# Patient Record
Sex: Male | Born: 1935 | Race: White | Hispanic: No | Marital: Married | Smoking: Never smoker
Health system: Southern US, Community
[De-identification: ages and names within clinical notes are randomized; demographics above are authoritative.]

## PROBLEM LIST (undated history)

## (undated) DIAGNOSIS — C801 Malignant (primary) neoplasm, unspecified: Secondary | ICD-10-CM

## (undated) DIAGNOSIS — I639 Cerebral infarction, unspecified: Secondary | ICD-10-CM

## (undated) DIAGNOSIS — E559 Vitamin D deficiency, unspecified: Secondary | ICD-10-CM

## (undated) DIAGNOSIS — K922 Gastrointestinal hemorrhage, unspecified: Secondary | ICD-10-CM

## (undated) DIAGNOSIS — K259 Gastric ulcer, unspecified as acute or chronic, without hemorrhage or perforation: Secondary | ICD-10-CM

## (undated) DIAGNOSIS — F0390 Unspecified dementia without behavioral disturbance: Secondary | ICD-10-CM

## (undated) DIAGNOSIS — I739 Peripheral vascular disease, unspecified: Secondary | ICD-10-CM

## (undated) DIAGNOSIS — M719 Bursopathy, unspecified: Secondary | ICD-10-CM

## (undated) DIAGNOSIS — Z8719 Personal history of other diseases of the digestive system: Secondary | ICD-10-CM

## (undated) DIAGNOSIS — I10 Essential (primary) hypertension: Secondary | ICD-10-CM

## (undated) DIAGNOSIS — E785 Hyperlipidemia, unspecified: Secondary | ICD-10-CM

## (undated) DIAGNOSIS — D649 Anemia, unspecified: Secondary | ICD-10-CM

## (undated) DIAGNOSIS — R251 Tremor, unspecified: Secondary | ICD-10-CM

## (undated) DIAGNOSIS — R0600 Dyspnea, unspecified: Secondary | ICD-10-CM

## (undated) DIAGNOSIS — R29898 Other symptoms and signs involving the musculoskeletal system: Secondary | ICD-10-CM

## (undated) DIAGNOSIS — E538 Deficiency of other specified B group vitamins: Secondary | ICD-10-CM

## (undated) DIAGNOSIS — N189 Chronic kidney disease, unspecified: Secondary | ICD-10-CM

## (undated) DIAGNOSIS — I454 Nonspecific intraventricular block: Secondary | ICD-10-CM

## (undated) DIAGNOSIS — E669 Obesity, unspecified: Secondary | ICD-10-CM

## (undated) HISTORY — PX: EYE SURGERY: SHX253

## (undated) HISTORY — PX: BACK SURGERY: SHX140

## (undated) HISTORY — PX: HERNIA REPAIR: SHX51

---

## 2006-01-11 ENCOUNTER — Emergency Department: Payer: Self-pay | Admitting: Emergency Medicine

## 2006-01-21 ENCOUNTER — Ambulatory Visit: Payer: Self-pay | Admitting: Internal Medicine

## 2006-05-19 ENCOUNTER — Ambulatory Visit: Payer: Self-pay | Admitting: Urology

## 2007-02-01 ENCOUNTER — Ambulatory Visit: Payer: Self-pay | Admitting: Ophthalmology

## 2007-06-27 ENCOUNTER — Ambulatory Visit: Payer: Self-pay | Admitting: Internal Medicine

## 2007-06-28 ENCOUNTER — Inpatient Hospital Stay: Payer: Self-pay | Admitting: Unknown Physician Specialty

## 2007-06-28 ENCOUNTER — Other Ambulatory Visit: Payer: Self-pay

## 2007-07-01 ENCOUNTER — Encounter: Payer: Self-pay | Admitting: Internal Medicine

## 2007-07-04 ENCOUNTER — Encounter: Payer: Self-pay | Admitting: Internal Medicine

## 2009-05-08 ENCOUNTER — Ambulatory Visit: Payer: Self-pay | Admitting: Unknown Physician Specialty

## 2009-12-25 ENCOUNTER — Ambulatory Visit: Payer: Self-pay | Admitting: Ophthalmology

## 2014-06-25 DIAGNOSIS — D649 Anemia, unspecified: Secondary | ICD-10-CM | POA: Insufficient documentation

## 2015-12-12 ENCOUNTER — Emergency Department
Admission: EM | Admit: 2015-12-12 | Discharge: 2015-12-12 | Disposition: A | Discharge status: Other Acute Inpatient Hospital | Payer: Medicare Other | Attending: Emergency Medicine | Admitting: Emergency Medicine

## 2015-12-12 ENCOUNTER — Encounter: Payer: Self-pay | Admitting: Intensive Care

## 2015-12-12 ENCOUNTER — Emergency Department: Payer: Medicare Other

## 2015-12-12 ENCOUNTER — Inpatient Hospital Stay (HOSPITAL_COMMUNITY)
Admission: AD | Admit: 2015-12-12 | Discharge: 2015-12-14 | DRG: 065 | Disposition: A | Discharge status: Home / Self Care | Payer: Medicare Other | Source: Other Acute Inpatient Hospital | Attending: Neurology | Admitting: Neurology

## 2015-12-12 DIAGNOSIS — I161 Hypertensive emergency: Secondary | ICD-10-CM | POA: Diagnosis not present

## 2015-12-12 DIAGNOSIS — I169 Hypertensive crisis, unspecified: Secondary | ICD-10-CM | POA: Diagnosis not present

## 2015-12-12 DIAGNOSIS — R29700 NIHSS score 0: Secondary | ICD-10-CM | POA: Diagnosis not present

## 2015-12-12 DIAGNOSIS — E785 Hyperlipidemia, unspecified: Secondary | ICD-10-CM | POA: Diagnosis present

## 2015-12-12 DIAGNOSIS — I619 Nontraumatic intracerebral hemorrhage, unspecified: Secondary | ICD-10-CM | POA: Diagnosis present

## 2015-12-12 DIAGNOSIS — R200 Anesthesia of skin: Secondary | ICD-10-CM | POA: Diagnosis present

## 2015-12-12 DIAGNOSIS — Z886 Allergy status to analgesic agent status: Secondary | ICD-10-CM | POA: Diagnosis not present

## 2015-12-12 DIAGNOSIS — F17220 Nicotine dependence, chewing tobacco, uncomplicated: Secondary | ICD-10-CM | POA: Diagnosis not present

## 2015-12-12 DIAGNOSIS — I10 Essential (primary) hypertension: Secondary | ICD-10-CM | POA: Diagnosis not present

## 2015-12-12 HISTORY — DX: Hyperlipidemia, unspecified: E78.5

## 2015-12-12 LAB — URINALYSIS COMPLETE WITH MICROSCOPIC (ARMC ONLY)
Bacteria, UA: NONE SEEN
Bilirubin Urine: NEGATIVE
GLUCOSE, UA: NEGATIVE mg/dL
KETONES UR: NEGATIVE mg/dL
Leukocytes, UA: NEGATIVE
Nitrite: NEGATIVE
Protein, ur: NEGATIVE mg/dL
SPECIFIC GRAVITY, URINE: 1.012 (ref 1.005–1.030)
SQUAMOUS EPITHELIAL / LPF: NONE SEEN
WBC, UA: NONE SEEN WBC/hpf (ref 0–5)
pH: 6 (ref 5.0–8.0)

## 2015-12-12 LAB — CBC
HCT: 36.9 % — ABNORMAL LOW (ref 40.0–52.0)
Hemoglobin: 12.6 g/dL — ABNORMAL LOW (ref 13.0–18.0)
MCH: 30.4 pg (ref 26.0–34.0)
MCHC: 34.3 g/dL (ref 32.0–36.0)
MCV: 88.7 fL (ref 80.0–100.0)
PLATELETS: 219 10*3/uL (ref 150–440)
RBC: 4.16 MIL/uL — ABNORMAL LOW (ref 4.40–5.90)
RDW: 13.5 % (ref 11.5–14.5)
WBC: 8.8 10*3/uL (ref 3.8–10.6)

## 2015-12-12 LAB — BASIC METABOLIC PANEL
Anion gap: 7 (ref 5–15)
BUN: 21 mg/dL — ABNORMAL HIGH (ref 6–20)
CALCIUM: 9.2 mg/dL (ref 8.9–10.3)
CO2: 27 mmol/L (ref 22–32)
CREATININE: 1.16 mg/dL (ref 0.61–1.24)
Chloride: 105 mmol/L (ref 101–111)
GFR calc Af Amer: >60 mL/min (ref >60)
GFR calc non Af Amer: 56 mL/min — ABNORMAL LOW (ref >60)
GLUCOSE: 98 mg/dL (ref 65–99)
Potassium: 3.6 mmol/L (ref 3.5–5.1)
Sodium: 139 mmol/L (ref 135–145)

## 2015-12-12 LAB — PROTIME-INR
INR: 1.07
Prothrombin Time: 14.1 seconds (ref 11.4–15.0)

## 2015-12-12 LAB — APTT: APTT: 37 s — AB (ref 24–36)

## 2015-12-12 LAB — TROPONIN I: TROPONIN I: 0.05 ng/mL — AB (ref <0.031)

## 2015-12-12 MED ORDER — PANTOPRAZOLE SODIUM 40 MG IV SOLR
40.0000 mg | Freq: Every day | INTRAVENOUS | Status: DC
  Administered 2015-12-12: 40 mg via INTRAVENOUS
  Filled 2015-12-12: qty 40

## 2015-12-12 MED ORDER — ACETAMINOPHEN 325 MG PO TABS: 650.0000 mg | ORAL_TABLET | ORAL | Status: DC | PRN

## 2015-12-12 MED ORDER — SENNOSIDES-DOCUSATE SODIUM 8.6-50 MG PO TABS
1.0000 | ORAL_TABLET | Freq: Two times a day (BID) | ORAL | Status: DC
Start: 2015-12-12 — End: 2015-12-14
  Administered 2015-12-12 – 2015-12-14 (×3): 1 via ORAL
  Filled 2015-12-12 (×3): qty 1

## 2015-12-12 MED ORDER — ACETAMINOPHEN 650 MG RE SUPP: 650.0000 mg | RECTAL | Status: DC | PRN

## 2015-12-12 MED ORDER — NICARDIPINE HCL IN NACL 20-0.86 MG/200ML-% IV SOLN
3.0000 mg/h | Freq: Once | INTRAVENOUS | Status: AC
Start: 2015-12-12 — End: 2015-12-12
  Administered 2015-12-12: 3 mg/h via INTRAVENOUS
  Filled 2015-12-12: qty 200

## 2015-12-12 MED ORDER — STROKE: EARLY STAGES OF RECOVERY BOOK
Freq: Once | Status: AC
  Administered 2015-12-12: 1
  Filled 2015-12-12: qty 1

## 2015-12-12 MED ORDER — NICARDIPINE HCL IN NACL 20-0.86 MG/200ML-% IV SOLN
3.0000 mg/h | INTRAVENOUS | Status: DC
  Administered 2015-12-12: 2 mg/h via INTRAVENOUS
  Administered 2015-12-13: 3 mg/h via INTRAVENOUS
  Filled 2015-12-12 (×2): qty 200

## 2015-12-12 NOTE — ED Provider Notes (Addendum)
[facility] Emergency Department Provider Note  ____________________________________________   I have reviewed the triage vital signs and the nursing notes.   HISTORY  Chief Complaint Numbness    HPI Leroy [name] is a 82 y.o. male who is not on any blood thinners or aspirin, his remote history of GI bleed and back surgery, takes medicine for high cholesterol only, states his normal blood pressures in the 120s to 130s. Presents today after being out chain sawing. He was chain sawing he did not fall or suffer any injury but his bilateral legs and bilateral hands became weak. He did not fall. He states he had trouble walking however and he sat down. EMS brought him directly here. He does not have a headache. He has no neck pain. He has no change in vision or hearing, he has no difficulty talking at this time he states he feels "okay".  History reviewed. No pertinent past medical history.  There are no active problems to display for this patient.   Past Surgical History  Procedure Laterality Date  . Back surgery    . Hernia repair      No current outpatient prescriptions on file.  Allergies Aspirin  History reviewed. No pertinent family history.  Social History Social History  Substance Use Topics  . Smoking status: Never Smoker   . Smokeless tobacco: Current User    Types: Chew  . Alcohol Use: No    Review of Systems Constitutional: No fever/chills Eyes: No visual changes. ENT: No sore throat. No stiff neck no neck pain Cardiovascular: Denies chest pain. Respiratory: Denies shortness of breath. Gastrointestinal:   no vomiting.  No diarrhea.  No constipation. Genitourinary: Negative for dysuria. Musculoskeletal: Negative lower extremity swelling Skin: Negative for rash. Neurological: Negative for headaches, see H&P 10-point ROS otherwise negative.  ____________________________________________   PHYSICAL EXAM:  VITAL SIGNS: ED Triage  Vitals  Enc Vitals Group     BP 12/12/15 1406 188/101 mmHg     Pulse Rate 12/12/15 1406 69     Resp 12/12/15 1406 20     Temp 12/12/15 1406 97.9 F (36.6 C)     Temp Source 12/12/15 1406 Oral     SpO2 12/12/15 1406 97 %     Weight 12/12/15 1406 244 lb 9.6 oz (110.95 kg)     Height 12/12/15 1406 6\' 1"  (1.854 m)     Head Cir --      Peak Flow --      Pain Score --      Pain Loc --      Pain Edu? --      Excl. in [place]? --     Constitutional: Alert and oriented. Well appearing and in no acute distress. Eyes: Conjunctivae are normal. PERRL. EOMI. Head: Atraumatic. Nose: No congestion/rhinnorhea. Mouth/Throat: Mucous membranes are moist.  Oropharynx non-erythematous. Neck: No stridor.   Nontender with no meningismus Cardiovascular: Normal rate, regular rhythm. Grossly normal heart sounds.  Good peripheral circulation. Respiratory: Normal respiratory effort.  No retractions. Lungs CTAB. Abdominal: Soft and nontender. No distention. No guarding no rebound Back:  There is no focal tenderness or step off there is no midline tenderness there are no lesions noted. there is no CVA tenderness Musculoskeletal: No lower extremity tenderness. No joint effusions, no DVT signs strong distal pulses no edema Neurologic: Cranial nerves II through XII are grossly intact 5 out of 5 strength bilateral upper and lower extremity. Finger to nose within normal limits heel to  shin within normal limits, speech is normal with no word finding difficulty or dysarthria, reflexes symmetric, pupils are equally round and reactive to light, there is no pronator drift, sensation is normal, vision is intact to confrontation, gait is deferred, there is no nystagmus, normal neurologic exam Skin:  Skin is warm, dry and intact. No rash noted. Psychiatric: Mood and affect are normal. Speech and behavior are normal.  ____________________________________________   LABS (all labs ordered are listed, but only abnormal results are  displayed)  Labs Reviewed  CBC - Abnormal; Notable for the following:    RBC 4.16 (*)    Hemoglobin 12.6 (*)    HCT 36.9 (*)    All other components within normal limits  BASIC METABOLIC PANEL  URINALYSIS COMPLETEWITH MICROSCOPIC ([facility] ONLY)  TROPONIN I  PROTIME-INR  APTT  CBG MONITORING, ED   ____________________________________________  EKG  I personally interpreted any EKGs ordered by me or triage  ____________________________________________  RADIOLOGY  I reviewed any imaging ordered by me or triage that were performed during my shift ____________________________________________   PROCEDURES  Procedure(s) performed: None  Critical Care performed: CRITICAL CARE Performed by: Schuyler Amor   Total critical care time: 60 minutes  Critical care time was exclusive of separately billable procedures and treating other patients.  Critical care was necessary to treat or prevent imminent or life-threatening deterioration.  Critical care was time spent personally by me on the following activities: development of treatment plan with patient and/or surrogate as well as nursing, discussions with consultants, evaluation of patient's response to treatment, examination of patient, obtaining history from patient or surrogate, ordering and performing treatments and interventions, ordering and review of laboratory studies, ordering and review of radiographic studies, pulse oximetry and re-evaluation of patient's condition.   ____________________________________________   INITIAL IMPRESSION / ASSESSMENT AND PLAN / ED COURSE  Pertinent labs & imaging results that were available during my care of the patient were reviewed by me and considered in my medical decision making (see chart for details).  Patient with her current NIH stroke scale of 0 however doesn't respond to prior 12-lead which is possibly hypertensive. So the nontraumatic by history. We will start him on a Cardene  drip to gradually bring down his blood pressure. He is in no distress with no headache and awake and alert. He is not on any blood thinners. At patient request we are calling [facility] to see if we can get him transferred there. The patient has no complaints. I have made aware of his findings.  ----------------------------------------- 3:51 PM on 12/12/2015 -----------------------------------------  [facility] is on diversion, calling [facility]  ----------------------------------------- 4:10 PM on 12/12/2015 -----------------------------------------  Blood pressure is trending down I did discuss with neurosurgeon Dr Sherley Bounds at [facility], who agrees with management including Cardene drip, however, he states that she go to the medicine service which is a usual protocol. He is asked me to call medicine service for admission. Patient remains with an NIH stroke scale of 0 we are calling the medicine service.    ----------------------------------------- 4:43 PM on 12/12/2015 -----------------------------------------  Pressure in the 0000000 systolic, patient remains an NIH stroke scale of 0, we will keep the drip at a very low rate, thick is done much father will turn it off. I did discuss with medicine at [facility] and they tell me that this patient needs to be admitted to neurology. Therefore we are not paging neurology. Dr. Hayden Rasmussen states bc of gtt he needs neurology to  admit.    ----------------------------------------- 4:49 PM on 12/12/2015 -----------------------------------------  Stressed with Dr. Jim Like, who agrees with management does wish the patient to be in the drip and [place] for a pressure below 160 for this patient which I think is not unreasonable. They sent the patient in ED to inpatient transfer.  ----------------------------------------- 5:52 PM on 12/12/2015 -----------------------------------------  No complaints no symptoms and NIH stroke scale 0 neuro exam still  unremarkable awaiting transport. [facility] is making a bed for him apparently.  ----------------------------------------- 7:56 PM on 12/12/2015 -----------------------------------------  Patient still waiting patiently for transfer. Blood pressures are mostly in the 150s and 160s which is fine for him I believe. I am skeptical about his idea of his baseline blood pressure, and I do not wish to aggressively drop it too precipitously. In any event he does respond to the drip well and I think at this time since he has no symptoms we'll leave it where it is. Patient has no headache and his NIH stroke scale remained 0 with a normal neurologic exam..     ____________________________________________   FINAL CLINICAL IMPRESSION(S) / ED DIAGNOSES  Final diagnoses:  None      This chart was dictated using voice recognition software.  Despite best efforts to proofread,  errors can occur which can change meaning.     Schuyler Amor, MD 12/12/15 [place], MD 12/12/15 1551  Schuyler Amor, MD 12/12/15 [place], MD 12/12/15 [place], MD 12/12/15 1643  Schuyler Amor, MD 12/12/15 [place], MD 12/12/15 1752  Schuyler Amor, MD 12/12/15 [phone]

## 2015-12-12 NOTE — ED Notes (Signed)
Per Dr Burlene Arnt, leave nicardipine at 2mg /hour.  Vaughan Basta at [facility] notified of this.  Dr Burlene Arnt wants to keep BP between 160s-170s at this time.

## 2015-12-12 NOTE — ED Notes (Signed)
Attempted to call report. Reported they will call me right back

## 2015-12-12 NOTE — Progress Notes (Signed)
Dr. Silverio Decamp ordered to have SBP goal 120-140. . Current BP 174/79. Cardene gtt increased. Will continue to monitor.

## 2015-12-12 NOTE — ED Notes (Signed)
Patient arrived by [facility] from home. Patient reports "I was using a chainsaw to cut wood and my hands and feet started going numb. Since arriving to the ER my hands feel ok but my feet still fell numb" Patient denies any chest pain and SOB

## 2015-12-12 NOTE — H&P (Signed)
HPI:                                                                                                                                         Leroy [name] is an 82 y.o. male patient who presented to the [facility] ER via EMS earlier this afternoon. He was using chain saw outside, when he noticed sudden onset of numbness in his legs, and also difficulty moving his legs and incoordination. Patient is a poor historian, unable to describe his symptoms involving the legs clearly. It is unclear if he had some coordination problem in his legs. He called EMS and was taken to the ER for further evaluation. Denied any weakness in his upper extremities no vision or speech problems no headaches or neck pain. CT of the head done in the [place] ER showed a tiny 6 mm intraparenchymal hemorrhage in the left basal ganglia. His blood pressure was noted to be highly elevated this to her systolic. He was started on nicardipine drip, and transferred to [facility] neuro ICU for further management. He was noted to be symptomatic prior to transfer, with reported NIHSS 0 per ER physician note.    Date last known well:  12/12/2015 Time last known well:   2 pm tPA Given: No: ICH  ICH score 1 (for age > 33)  Stroke Risk Factors - hypertension  Past Medical History: Past Medical History  Diagnosis Date  . Hyperlipidemia     Past Surgical History  Procedure Laterality Date  . Back surgery    . Hernia repair      Family History: No family history on file.  Social History:   reports that he has never smoked. His smokeless tobacco use includes Chew. He reports that he does not drink alcohol or use illicit drugs.  Allergies:  Allergies  Allergen Reactions  . Aspirin      Medications:                                                                                                                         Current facility-administered medications:  .   stroke: mapping our early stages of recovery  book, , Does not apply, Once, Niyati Heinke Fuller Mandril, MD .  acetaminophen (TYLENOL) tablet 650 mg, 650 mg, Oral, Q4H PRN **OR** acetaminophen (TYLENOL) suppository 650 mg, 650 mg, Rectal, Q4H PRN,  Riese Hellard Fuller Mandril, MD .  nicardipine (CARDENE) 20mg  in 0.86% saline 244ml IV infusion (0.1 mg/ml), 3-15 mg/hr, Intravenous, Continuous, Ricahrd Schwager Fuller Mandril, MD .  pantoprazole (PROTONIX) injection 40 mg, 40 mg, Intravenous, QHS, Trinette Vera Fuller Mandril, MD .  senna-docusate (Senokot-S) tablet 1 tablet, 1 tablet, Oral, BID, Niyla Marone Fuller Mandril, MD   ROS:                                                                                                                                       History obtained from the patient  General ROS: negative for - chills, fatigue, fever, night sweats, weight gain or weight loss Psychological ROS: negative for - behavioral disorder, hallucinations, memory difficulties, mood swings or suicidal ideation Ophthalmic ROS: negative for - blurry vision, double vision, eye pain or loss of vision ENT ROS: negative for - epistaxis, nasal discharge, oral lesions, sore throat, tinnitus or vertigo Allergy and Immunology ROS: negative for - hives or itchy/watery eyes Hematological and Lymphatic ROS: negative for - bleeding problems, bruising or swollen lymph nodes Endocrine ROS: negative for - galactorrhea, hair pattern changes, polydipsia/polyuria or temperature intolerance Respiratory ROS: negative for - cough, hemoptysis, shortness of breath or wheezing Cardiovascular ROS: negative for - chest pain, dyspnea on exertion, edema or irregular heartbeat Gastrointestinal ROS: negative for - abdominal pain, diarrhea, hematemesis, nausea/vomiting or stool incontinence Genito-Urinary ROS: negative for - dysuria, hematuria, incontinence or urinary frequency/urgency Musculoskeletal ROS: negative for - joint swelling or muscular weakness Neurological ROS: as  noted in HPI Dermatological ROS: negative for rash and skin lesion changes  Neurologic Examination:                                                                                                      There were no vitals taken for this visit.  Evaluation of higher integrative functions including: Level of alertness: Alert,  Oriented to time, place and person Speech: fluent, no evidence of dysarthria or aphasia noted.  Test the following cranial nerves: 2-12 grossly intact Motor examination: Normal tone, bulk, full 5/5 motor strength in all 4 extremities Examination of sensation : Normal and symmetric sensation to pinprick in all 4 extremities and on face Examination of deep tendon reflexes: 2+,symmetric in all extremities,mute plantars Test coordination: Normal finger nose testing, with no evidence of limb appendicular ataxia or abnormal involuntary movements or tremors noted.  Gait: Deferred   Lab Results: Basic Metabolic Panel:  Recent Labs Lab 12/12/15 1438  NA 139  K 3.6  CL 105  CO2 27  GLUCOSE 98  BUN 21*  CREATININE 1.16  CALCIUM 9.2    Liver Function Tests: No results for input(s): AST, ALT, ALKPHOS, BILITOT, PROT, ALBUMIN in the last 168 hours. No results for input(s): LIPASE, AMYLASE in the last 168 hours. No results for input(s): AMMONIA in the last 168 hours.  CBC:  Recent Labs Lab 12/12/15 1438  WBC 8.8  HGB 12.6*  HCT 36.9*  MCV 88.7  PLT 219    Cardiac Enzymes:  Recent Labs Lab 12/12/15 1438  TROPONINI 0.05*    Lipid Panel: No results for input(s): CHOL, TRIG, HDL, CHOLHDL, VLDL, LDLCALC in the last 168 hours.  CBG: No results for input(s): GLUCAP in the last 168 hours.  Microbiology: No results found for this or any previous visit.   Imaging: Ct Head Wo Contrast  12/12/2015  CLINICAL DATA:  Numbness of the hands and feet starting today. EXAM: CT HEAD WITHOUT CONTRAST TECHNIQUE: Contiguous axial images were obtained from the base  of the skull through the vertex without intravenous contrast. COMPARISON:  None. FINDINGS: There is a 5.7 mm focus of increased density in the left posterior limb internal capsule. There is a 2 mm focus of increased density in the right anterior limb internal capsule. There is focal calcification in the right cerebellum. There is no midline shift, hydrocephalus or mass. No acute transcortical infarct is identified. The bony calvarium is intact. Minimal mucoperiosteal thickening of bilateral ethmoid sinuses are noted. IMPRESSION: 5.7 mm focus of increased density in the left posterior limb internal capsule suspicious for acute hemorrhage. There is a 2 mm focus of increased density in the right anterior limb internal capsule, small focus of acute hemorrhage is not excluded. Critical Value/emergent results were called by telephone at the time of interpretation on 12/12/2015 at 3:26 pm to Dr. Delman Kitten , who verbally acknowledged these results. Electronically Signed   By: Abelardo Diesel M.D.   On: 12/12/2015 15:27    Assessment and plan:   Leroy [name] is an 82 y.o. 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 He was started on nicardipine drip prior to transfer , his blood pressure continued to be elevated when he arrived at [facility] systolic blood pressures in 170s.  Patient remains asymptomatic at this time, with nonfocal neurological examination.NIHSS 0, ICH score 1 (for age > 44).   Plan: 1. Titrate nicardipine drip for a goal systolic blood pressure between 120-140 to prevent expansion of the intraparenchymal hemorrhage.  2. Ordered MRI of the brain with and without contrast, and MRA of the head 3. Ultrasound of the carotids, transthoracic echocardiogram. Monitored with cardiac telemetry in the  ICU. 4. Does not take antiplatelet or anticoagulant agents at home. SCDs for DVT prophylaxis. Glucose monitoring, check A1c, lipid profile.  5. Physical and occupational therapy, swallow evaluation.  Discussed the neurological assessment, reviewed the CT of the brain images with the patient and his family members at bedside, answered several of their questions to their satisfaction.  Stroke team will continue to follow up starting tomorrow morning. Please call for any further questions.

## 2015-12-13 ENCOUNTER — Observation Stay (HOSPITAL_COMMUNITY): Payer: Medicare Other

## 2015-12-13 ENCOUNTER — Inpatient Hospital Stay (HOSPITAL_COMMUNITY): Payer: Medicare Other

## 2015-12-13 DIAGNOSIS — Z886 Allergy status to analgesic agent status: Secondary | ICD-10-CM | POA: Diagnosis not present

## 2015-12-13 DIAGNOSIS — I6789 Other cerebrovascular disease: Secondary | ICD-10-CM | POA: Diagnosis not present

## 2015-12-13 DIAGNOSIS — R29700 NIHSS score 0: Secondary | ICD-10-CM | POA: Diagnosis present

## 2015-12-13 DIAGNOSIS — E785 Hyperlipidemia, unspecified: Secondary | ICD-10-CM | POA: Diagnosis present

## 2015-12-13 DIAGNOSIS — F17220 Nicotine dependence, chewing tobacco, uncomplicated: Secondary | ICD-10-CM | POA: Diagnosis present

## 2015-12-13 DIAGNOSIS — I161 Hypertensive emergency: Secondary | ICD-10-CM | POA: Diagnosis present

## 2015-12-13 DIAGNOSIS — I619 Nontraumatic intracerebral hemorrhage, unspecified: Secondary | ICD-10-CM | POA: Diagnosis present

## 2015-12-13 DIAGNOSIS — I610 Nontraumatic intracerebral hemorrhage in hemisphere, subcortical: Secondary | ICD-10-CM | POA: Diagnosis not present

## 2015-12-13 LAB — GLUCOSE, CAPILLARY
GLUCOSE-CAPILLARY: 100 mg/dL — AB (ref 65–99)
GLUCOSE-CAPILLARY: 137 mg/dL — AB (ref 65–99)
GLUCOSE-CAPILLARY: 115 mg/dL — AB (ref 65–99)
Glucose-Capillary: 131 mg/dL — ABNORMAL HIGH (ref 65–99)

## 2015-12-13 LAB — MRSA PCR SCREENING: MRSA by PCR: NEGATIVE

## 2015-12-13 LAB — LIPID PANEL
CHOL/HDL RATIO: 3.7 ratio
CHOLESTEROL: 177 mg/dL (ref 0–200)
HDL: 48 mg/dL (ref >40)
LDL Cholesterol: 104 mg/dL — ABNORMAL HIGH (ref 0–99)
TRIGLYCERIDES: 124 mg/dL (ref <150)
VLDL: 25 mg/dL (ref 0–40)

## 2015-12-13 MED ORDER — GADOBENATE DIMEGLUMINE 529 MG/ML IV SOLN
20.0000 mL | Freq: Once | INTRAVENOUS | Status: AC | PRN
  Administered 2015-12-13: 20 mL via INTRAVENOUS

## 2015-12-13 MED ORDER — PANTOPRAZOLE SODIUM 40 MG PO TBEC: 40.0000 mg | DELAYED_RELEASE_TABLET | Freq: Every day | ORAL | Status: DC

## 2015-12-13 MED ORDER — AMLODIPINE BESYLATE 5 MG PO TABS
5.0000 mg | ORAL_TABLET | Freq: Every day | ORAL | Status: DC
  Administered 2015-12-13 – 2015-12-14 (×2): 5 mg via ORAL
  Filled 2015-12-13 (×2): qty 1

## 2015-12-13 MED ORDER — ATORVASTATIN CALCIUM 10 MG PO TABS
20.0000 mg | ORAL_TABLET | Freq: Every day | ORAL | Status: DC
  Administered 2015-12-13 – 2015-12-14 (×2): 20 mg via ORAL
  Filled 2015-12-13 (×2): qty 2

## 2015-12-13 NOTE — Progress Notes (Signed)
Patient arrived to 5M14 from 25M. Safety precautions and orders reviewed with patient. Lunch reorder. No other distress noted. Will continue to monitor.   Ave Filter, RN

## 2015-12-13 NOTE — Progress Notes (Signed)
*  PRELIMINARY RESULTS* Vascular Ultrasound Carotid Duplex (Doppler) has been completed.  Preliminary findings: Bilateral: No significant (1-39%) ICA stenosis. Antegrade vertebral flow.    Landry Mellow, RDMS, RVT  12/13/2015, 4:57 PM

## 2015-12-13 NOTE — Evaluation (Signed)
Speech Language Pathology Evaluation Patient Details Name: Leroy [name] MRN: [id number] DOB: 1929/11/29 Today's Date: 12/13/2015 Time: [id number] SLP Time Calculation (min) (ACUTE ONLY): 14 min  Problem List:  Patient Active Problem List   Diagnosis Date Noted  . Hypertensive emergency 12/13/2015  . 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 Acute intracerebral hemorrhage ([place]) 12/12/2015   Past Medical History:  Past Medical History  Diagnosis Date  . Hyperlipidemia    Past Surgical History:  Past Surgical History  Procedure Laterality Date  . Back surgery    . Hernia repair     HPI:  82 y.o. male with history of hypertension and hyperlipidemia presenting with numbness, weakness and incoordination in his legs. CT of the brain showed a tiny left basal ganglia hemorrhage.   Assessment / Plan / Recommendation Clinical Impression  Pt presents with baseline speech, language, cognitive function with adequate attention, mild memory impairments (baseline), fluent and appropriate language.  No SLP f/u is recommended - wife/dtr in agreement.  Will sign off.     SLP Assessment  Patient does not need any further Speech Lanaguage Pathology Services    Follow Up Recommendations  None    Frequency and Duration           SLP Evaluation Prior Functioning  Cognitive/Linguistic Baseline: Within functional limits Type of Home: House  Lives With: Spouse Available Help at Discharge: Family;Available 24 hours/day Vocation: Retired   Associate Professor  Overall Cognitive Status: Within Functional Limits for tasks assessed Orientation Level: Oriented X4    Comprehension  Auditory Comprehension Overall Auditory Comprehension: Appears within functional limits for tasks assessed Visual Recognition/Discrimination Discrimination: Within Function Limits Reading Comprehension Reading Status: Not tested    Expression Expression Primary Mode of Expression: Verbal Verbal  Expression Overall Verbal Expression: Appears within functional limits for tasks assessed Written Expression Dominant Hand: Right   Oral / Motor  Oral Motor/Sensory Function Overall Oral Motor/Sensory Function: Within functional limits Motor Speech Overall Motor Speech: Appears within functional limits for tasks assessed   GO                    Leroy [name] 12/13/2015, 2:52 PM

## 2015-12-13 NOTE — Progress Notes (Addendum)
STROKE TEAM PROGRESS NOTE   HISTORY OF PRESENT ILLNESS Leroy [name] is an 82 y.o. male patient who presented to the [facility] ER via EMS earlier this afternoon. He was using chain saw outside, when he noticed sudden onset of numbness in his legs, and also difficulty moving his legs and incoordination. Patient is a poor historian, unable to describe his symptoms involving the legs clearly. It is unclear if he had some coordination problem in his legs. He called EMS and was taken to the ER for further evaluation. Denied any weakness in his upper extremities no vision or speech problems no headaches or neck pain. CT of the head done in the [facility] showed a tiny 6 mm intraparenchymal hemorrhage in the left basal ganglia. His blood pressure was noted to be highly elevated this to her systolic. He was started on nicardipine drip, and transferred to [facility] neuro ICU for further management. He was noted to be symptomatic prior to transfer, with reported NIHSS 0 per ER physician note. he was last known well to 12/12/2015 at 2 PM. ICH score 1 (for age > 70). Patient was not administered TPA secondary to intracerebral hemorrhage. He was admitted to the neuro ICU for further evaluation and treatment.   SUBJECTIVE (INTERVAL HISTORY) His daughter Leroy [name] and son in law Leroy [name] is at the bedside. Patient is sitting up in the chair. Overall he feels his condition is stable. He recounted history of bilateral loss of coordination.    OBJECTIVE Temp:  [97.5 F (36.4 C)-97.9 F (36.6 C)] 97.6 F (36.4 C) (02/10 0731) Pulse Rate:  [58-89] 60 (02/10 0830) Cardiac Rhythm:  [-]  Resp:  [10-20] 16 (02/10 0830) BP: (111-196)/(63-136) 126/75 mmHg (02/10 0830) SpO2:  [92 %-100 %] 98 % (02/10 0830) Weight:  [107.5 kg (236 lb 15.9 oz)-110.95 kg (244 lb 9.6 oz)] 107.5 kg (236 lb 15.9 oz) (02/10 0000)  CBC:   Recent Labs Lab 12/12/15 1438  WBC 8.8  HGB 12.6*  HCT 36.9*  MCV 88.7  PLT 219     Basic Metabolic Panel:   Recent Labs Lab 12/12/15 1438  NA 139  K 3.6  CL 105  CO2 27  GLUCOSE 98  BUN 21*  CREATININE 1.16  CALCIUM 9.2    Lipid Panel:     Component Value Date/Time   CHOL 177 12/13/2015 0111   TRIG 124 12/13/2015 0111   HDL 48 12/13/2015 0111   CHOLHDL 3.7 12/13/2015 0111   VLDL 25 12/13/2015 0111   LDLCALC 104* 12/13/2015 0111   HgbA1c: No results found for: HGBA1C Urine Drug Screen: No results found for: LABOPIA, COCAINSCRNUR, LABBENZ, AMPHETMU, THCU, LABBARB    IMAGING  Ct Head Wo Contrast  12/12/2015  CLINICAL DATA:  Numbness of the hands and feet starting today. EXAM: CT HEAD WITHOUT CONTRAST TECHNIQUE: Contiguous axial images were obtained from the base of the skull through the vertex without intravenous contrast. COMPARISON:  None. FINDINGS: There is a 5.7 mm focus of increased density in the left posterior limb internal capsule. There is a 2 mm focus of increased density in the right anterior limb internal capsule. There is focal calcification in the right cerebellum. There is no midline shift, hydrocephalus or mass. No acute transcortical infarct is identified. The bony calvarium is intact. Minimal mucoperiosteal thickening of bilateral ethmoid sinuses are noted. IMPRESSION: 5.7 mm focus of increased density in the left posterior limb internal capsule suspicious for acute hemorrhage. There is a 2 mm  focus of increased density in the right anterior limb internal capsule, small focus of acute hemorrhage is not excluded. Critical Value/emergent results were called by telephone at the time of interpretation on 12/12/2015 at 3:26 pm to Dr. Delman Kitten , who verbally acknowledged these results. Electronically Signed   By: Abelardo Diesel M.D.   On: 12/12/2015 15:27       PHYSICAL EXAM Pleasant elderly caucasian male not in distress. . Afebrile. Head is nontraumatic. Neck is supple without bruit.    Cardiac exam no murmur or gallop. Lungs are clear to  auscultation. Distal pulses are well felt. Neurological Exam :  Awake alert oriented 3 with normal speech and language function. No aphasia or apraxia dysarthria. Pupils are equal reactive. Extraocular movements are full range without nystagmus. Fundi were not visualized. Vision acuity seems adequate. Visual fields are full to bedside confrontational testing. Face is symmetric without weakness. Tongue is midline. Cough and gag are normal. Motor system exam reveals no upper or lower extremity drift. Symmetric and equal strength in all 4 extremity. Normal tone and bulk and coordination. Sensation is intact bilaterally. Finger-to-nose and knee to heel coordination accurate. Gait was not tested. ASSESSMENT/PLAN [name] is a 82 y.o. male with history of hypertension and hyperlipidemia presenting with numbness, weakness and incoordination in his legs. CT of the brain showed a tiny left basal ganglia hemorrhage.   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  MRI  pending   MRA  pending   Carotid Doppler  pending   2D Echo  pending   LDL 104  HgbA1c pending  SCDs for VTE prophylaxis Diet Heart Room service appropriate?: Yes; Fluid consistency:: Thin  No antithrombotic prior to admission. Consider resumption of aspirin in the future once hemorrhage resolved.  Ongoing aggressive stroke risk factor management  Therapy recommendations:  pending . Ok to be OOB.  Disposition:  pending   Transfer to the floor  Hypertensive emergency  Blood pressure 188/101 on arrival with neurologic symptoms  Placed on nicardipine drip with systolic blood pressure goal 160-170 by Dr. Burlene Arnt at [facility], lowered to 120-140 on arrrival to [facility]  Relatively quickly lowered and stayed down on cardene. Drip off this am at 830a.   Not on BP meds at home prior to admission  Patient reports his BP typically remains  low  Add norvasc 5 mg daily  Hyperlipidemia  Home meds:  lipitor 40, resumed in hospital  LDL 104, goal < 70  Continue statin at discharge  Other Stroke Risk Factors  Advanced age  Smokeless tobacco  Obesity, Body mass index is 31.27 kg/(m^2).   Other Active Problems  Creatinine 1.16  Troponin 0.05  Hospital day # 1  BIBY,SHARON  [place] for Pager information 12/13/2015 10:15 AM  I have personally examined this patient, reviewed notes, independently viewed imaging studies, participated in medical decision making and plan of care. I have made any additions or clarifications directly to the above note. Agree with note above. Patient presented with sudden onset of gait ataxia and leg incoordination with significantly elevated blood pressure with tiny 5 mm left subcortical hemorrhage likely due to malignant hypertension. He remains at significant risk for neurological worsening, hematoma expansion, recurrent stroke and needs close neurological monitoring and strict blood pressure control. Start the patient on oral blood pressure medications. And repeat imaging study. Long discussion with the patient and family at the  bedside and answered questions. Transfer to the neurological floor later this evening if stable This patient is critically ill and at significant risk of neurological worsening, death and care requires constant monitoring of vital signs, hemodynamics,respiratory and cardiac monitoring, extensive review of multiple databases, frequent neurological assessment, discussion with family, other specialists and medical decision making of high complexity.I have made any additions or clarifications directly to the above note.This critical care time does not reflect procedure time, or teaching time or supervisory time of PA/NP/Med Resident etc but could involve care discussion time.  I spent 30 minutes of neurocritical care time  in the care of  this  patient.  Antony Contras, MD Medical Director [facility] Pager: [phone] 12/13/2015 2:06 PM    To contact Stroke Continuity provider, please refer to [email or web address] After hours, contact General Neurology

## 2015-12-13 NOTE — Progress Notes (Signed)
OT NOTE OT order received and appreciated however this conflicts with current bedrest order set. Please increase activity tolerance as appropriate and remove bedrest from orders. . Please contact OT at [phone] if bed rest order is discontinued. OT will hold evaluation at this time and will check back as time allows pending increased activity orders.   Jeri Modena   OTR/L Pager: [phone] Office: [phone] .

## 2015-12-13 NOTE — Progress Notes (Signed)
*  PRELIMINARY RESULTS* Echocardiogram 2D Echocardiogram has been performed.  Leroy [name] 12/13/2015, 3:40 PM

## 2015-12-13 NOTE — Evaluation (Signed)
Physical Therapy Evaluation Patient Details Name: Leroy [name] MRN: [id number] DOB: [birth year] Today's Date: 12/13/2015   History of Present Illness  pt presents with L Basal Ganglia ICH.  pt with hx of HTN.    Clinical Impression  Pt very motivated and receptive to cueing.  Pt mildly unsteady during mobility and admits he feels safer with RW at this time.  Discussed continued therapies at home and the need for family to be present initially for mobility.  Feel pt will make a great recovery and will follow acutely.      Follow Up Recommendations Home health PT;Supervision for mobility/OOB    Equipment Recommendations  Rolling walker with 5" wheels    Recommendations for Other Services       Precautions / Restrictions Precautions Precautions: Fall Restrictions Weight Bearing Restrictions: No      Mobility  Bed Mobility Overal bed mobility: Needs Assistance Bed Mobility: Supine to Sit     Supine to sit: Supervision     General bed mobility comments: pt needs increased time and heavy use of UEs, but no physical A needed.    Transfers Overall transfer level: Needs assistance Equipment used: None;Rolling walker (2 wheeled) Transfers: Sit to/from Stand Sit to Stand: Min guard         General transfer comment: pt is able to come to stand without A, but with heavy reliance on UEs and increased time to complete.    Ambulation/Gait Ambulation/Gait assistance: Min guard;Min assist Ambulation Distance (Feet): 60 Feet (x2) Assistive device: Rolling walker (2 wheeled);None Gait Pattern/deviations: Step-through pattern;Decreased stride length;Narrow base of support     General Gait Details: pt initially ambulated without AD and noted to have L lateral lean and mild decreased coordination in Bil LEs.  pt does supinate L foot, but states this is baseline for him.  TRialed ambulation with a RW and pt with much improved safety and able to be MinG for mobility.    Stairs             Wheelchair Mobility    Modified Rankin (Stroke Patients Only) Modified Rankin (Stroke Patients Only) Pre-Morbid Rankin Score: No symptoms Modified Rankin: Moderately severe disability     Balance Overall balance assessment: Needs assistance Sitting-balance support: No upper extremity supported;Feet supported Sitting balance-Leahy Scale: Good     Standing balance support: No upper extremity supported;During functional activity Standing balance-Leahy Scale: Fair                               Pertinent Vitals/Pain Pain Assessment: No/denies pain    Home Living Family/patient expects to be discharged to:: Private residence Living Arrangements: Spouse/significant other Available Help at Discharge: Family;Available 24 hours/day Type of Home: House Home Access: Stairs to enter Entrance Stairs-Rails: Right Entrance Stairs-Number of Steps: 3 Home Layout: Laundry or work area in [employer]: None      Prior Function Level of Independence: Independent               Journalist, newspaper        Extremity/Trunk Assessment   Upper Extremity Assessment: Defer to OT evaluation           Lower Extremity Assessment: RLE deficits/detail;LLE deficits/detail RLE Deficits / Details: Coordination decreased and sensation mildly diminished.  pt indicates he can feel soft touch, but not as strong as L LE.   LLE Deficits / Details: Decreased coordination.  At baseline pt indicates  he does walk on the outside of his L foot.       Communication   Communication: No difficulties  Cognition Arousal/Alertness: Awake/alert Behavior During Therapy: WFL for tasks assessed/performed Overall Cognitive Status: Within Functional Limits for tasks assessed                      General Comments      Exercises        Assessment/Plan    PT Assessment Patient needs continued PT services  PT Diagnosis Difficulty walking   PT Problem List  Decreased activity tolerance;Decreased balance;Decreased mobility;Decreased coordination;Decreased knowledge of use of DME;Impaired sensation  PT Treatment Interventions DME instruction;Gait training;Stair training;Functional mobility training;Therapeutic activities;Therapeutic exercise;Balance training;Neuromuscular re-education;Patient/family education   PT Goals (Current goals can be found in the Care Plan section) Acute Rehab PT Goals Patient Stated Goal: Per pt to get back to doing things at home. PT Goal Formulation: With patient Time For Goal Achievement: 12/20/15 Potential to Achieve Goals: Good    Frequency Min 4X/week   Barriers to discharge        Co-evaluation               End of Session Equipment Utilized During Treatment: Gait belt Activity Tolerance: Patient tolerated treatment well Patient left: in chair;with call bell/phone within reach;with family/visitor present Nurse Communication: Mobility status         Time: [id number] PT Time Calculation (min) (ACUTE ONLY): 34 min   Charges:   PT Evaluation $PT Eval Moderate Complexity: 1 Procedure PT Treatments $Gait Training: 8-22 mins   PT G CodesCatarina Hartshorn, [place] 12/13/2015, 2:14 PM

## 2015-12-13 NOTE — Care Management Note (Signed)
Case Management Note  Patient Details  Name: Leroy [name] MRN: [id number] Date of Birth: 14-Jan-1930  Subjective/Objective:  Pt admitted on 12/12/15 with ICH.  PTA, pt independent, lives with spouse.                   Action/Plan: Will follow for discharge planning as pt progresses.    Expected Discharge Date:                  Expected Discharge Plan:  [place]  In-House Referral:     Discharge planning Services  CM Consult  Post Acute Care Choice:    Choice offered to:     DME Arranged:    DME Agency:     HH Arranged:    [place] Agency:     Status of Service:  In process, will continue to follow  Medicare Important Message Given:    Date Medicare IM Given:    Medicare IM give by:    Date Additional Medicare IM Given:    Additional Medicare Important Message give by:     If discussed at [place] of Stay Meetings, dates discussed:    Additional Comments:  Reinaldo Raddle, RN, BSN  Trauma/Neuro ICU Case Manager [phone]

## 2015-12-14 DIAGNOSIS — I610 Nontraumatic intracerebral hemorrhage in hemisphere, subcortical: Secondary | ICD-10-CM

## 2015-12-14 DIAGNOSIS — E785 Hyperlipidemia, unspecified: Secondary | ICD-10-CM

## 2015-12-14 DIAGNOSIS — I161 Hypertensive emergency: Secondary | ICD-10-CM

## 2015-12-14 LAB — HEMOGLOBIN A1C
HEMOGLOBIN A1C: 6.1 % — AB (ref 4.8–5.6)
MEAN PLASMA GLUCOSE: 128 mg/dL

## 2015-12-14 MED ORDER — ATORVASTATIN CALCIUM 40 MG PO TABS: 40.0000 mg | ORAL_TABLET | Freq: Every day | ORAL | Status: DC

## 2015-12-14 MED ORDER — AMLODIPINE BESYLATE 5 MG PO TABS: 5.0000 mg | ORAL_TABLET | Freq: Every day | ORAL | Status: DC

## 2015-12-14 NOTE — Progress Notes (Signed)
Pt discharged home with family. IV removed and discharge instructions given. No patient questions at this time. Leaving unit via wheelchair with nurse tech. Wendee Copp

## 2015-12-14 NOTE — Discharge Instructions (Signed)
1. Check blood pressure at least once daily. Keep a record for your primary care M.D.. 2. Increase Lipitor to 40 mg daily. 3. Outpatient physical therapy will be arranged. 4. Avoid tobacco products. 5. Do not take any blood thinning medications. If unsure about a medication speak to your pharmacist or your primary care M.D.

## 2015-12-14 NOTE — Progress Notes (Signed)
Physical Therapy Treatment Patient Details Name: Leroy [name] MRN: [id number] DOB: 04-06-1930 Today's Date: 12/14/2015    History of Present Illness pt presents with L Basal Ganglia ICH.  pt with hx of HTN and back surgery    PT Comments    Pt progressing well, was on hallway walking with family upon PT arrival.  Pt may benefit from OPPT vs HHPT to further progress strength and balance to return to baseline level of function.  Patient will benefit from continued skilled PT to reduce fall risk, and to improve functional mobility.   Follow Up Recommendations  Supervision for mobility/OOB;Outpatient PT     Equipment Recommendations  None recommended by PT (has walker at home from prior surgery per patient's dau)    Recommendations for Other Services       Precautions / Restrictions Precautions Precautions: Fall Restrictions Weight Bearing Restrictions: No    Mobility  Bed Mobility               General bed mobility comments: OOB upon arrival, walking in hall with family  Transfers   Equipment used: Rolling walker (2 wheeled)             General transfer comment: pt out in hall walking with family upon arrival  Ambulation/Gait Ambulation/Gait assistance: Supervision Ambulation Distance (Feet): 120 Feet (with therapist, observed pt walk in hall with family x 150' ) Assistive device: Rolling walker (2 wheeled);None Gait Pattern/deviations: Decreased dorsiflexion - right;Decreased stance time - right (open self-closing door mod assist with mod sequencing cues)         Stairs Stairs: Yes Stairs assistance: Min guard Stair Management: One rail Right;Step to pattern;Forwards Number of Stairs: 3 General stair comments: mod cues for sequencing to manage walker, instructed caregiver in proper guarding  Wheelchair Mobility    Modified Rankin (Stroke Patients Only) Modified Rankin (Stroke Patients Only) Pre-Morbid Rankin Score: No symptoms Modified  Rankin: Slight disability     Balance Overall balance assessment: Needs assistance Sitting-balance support: No upper extremity supported;Feet supported Sitting balance-Leahy Scale: Good     Standing balance support: Bilateral upper extremity supported Standing balance-Leahy Scale: Fair                      Cognition Arousal/Alertness: Awake/alert Behavior During Therapy: WFL for tasks assessed/performed Overall Cognitive Status: Within Functional Limits for tasks assessed                      Exercises      General Comments General comments (skin integrity, edema, etc.): post exercise HR 80 bpm      Pertinent Vitals/Pain Pain Assessment: No/denies pain    Home Living                      Prior Function            PT Goals (current goals can now be found in the care plan section) Acute Rehab PT Goals Patient Stated Goal: return home, regain strength rt foot PT Goal Formulation: With patient/family Time For Goal Achievement: 12/20/15 Potential to Achieve Goals: Good Progress towards PT goals: Progressing toward goals    Frequency  Min 4X/week    PT Plan Current plan remains appropriate    Co-evaluation             End of Session Equipment Utilized During Treatment: Gait belt Activity Tolerance: Patient tolerated treatment well Patient left: in chair;with call bell/phone  within reach;with family/visitor present     Time: [id number] PT Time Calculation (min) (ACUTE ONLY): 23 min  Charges:  $Gait Training: 23-37 mins                    G CodesMalka So, PT [phone]  [place] 12/14/2015, 10:54 AM

## 2015-12-14 NOTE — Discharge Summary (Signed)
Stroke Discharge Summary  Patient ID: Leroy [name]   MRN: [id number]      DOB: 08-07-1930  Date of Admission: 12/12/2015 Date of Discharge: 12/14/2015  Attending Physician:  Rosalin Hawking, MD, Stroke MD Consulting Physician(s):     None  Patient's PCP:  Idelle Crouch, MD  DISCHARGE DIAGNOSIS: 6 mm area of acute hemorrhage in the left basal ganglia involving the posterior limb internal capsule and lateral thalamus. Principal Problem:   Nontraumatic intracerebral hemorrhage (HCC) Active Problems:   Acute intracerebral hemorrhage (HCC)   Hypertensive emergency  BMI: Body mass index is 31.27 kg/(m^2).  Past Medical History  Diagnosis Date  . Hyperlipidemia    Past Surgical History  Procedure Laterality Date  . Back surgery    . Hernia repair        Medication List    TAKE these medications        amLODipine 5 MG tablet  Commonly known as:  NORVASC  Take 1 tablet (5 mg total) by mouth daily.     atorvastatin 40 MG tablet  Commonly known as:  LIPITOR  Take 1 tablet (40 mg total) by mouth daily.        LABORATORY STUDIES CBC    Component Value Date/Time   WBC 8.8 12/12/2015 1438   RBC 4.16* 12/12/2015 1438   HGB 12.6* 12/12/2015 1438   HCT 36.9* 12/12/2015 1438   PLT 219 12/12/2015 1438   MCV 88.7 12/12/2015 1438   MCH 30.4 12/12/2015 1438   MCHC 34.3 12/12/2015 1438   RDW 13.5 12/12/2015 1438   CMP    Component Value Date/Time   NA 139 12/12/2015 1438   K 3.6 12/12/2015 1438   CL 105 12/12/2015 1438   CO2 27 12/12/2015 1438   GLUCOSE 98 12/12/2015 1438   BUN 21* 12/12/2015 1438   CREATININE 1.16 12/12/2015 1438   CALCIUM 9.2 12/12/2015 1438   GFRNONAA 56* 12/12/2015 1438   GFRAA >60 12/12/2015 1438   COAGS Lab Results  Component Value Date   INR 1.07 12/12/2015   Lipid Panel    Component Value Date/Time   CHOL 177 12/13/2015 0111   TRIG 124 12/13/2015 0111   HDL 48 12/13/2015 0111   CHOLHDL 3.7 12/13/2015 0111   VLDL 25 12/13/2015  0111   LDLCALC 104* 12/13/2015 0111   HgbA1C  Lab Results  Component Value Date   HGBA1C 6.1* 12/13/2015   Cardiac Panel (last 3 results)   Recent Labs  12/12/15 1438  TROPONINI 0.05*   Urinalysis    Component Value Date/Time   COLORURINE YELLOW* 12/12/2015 1551   APPEARANCEUR CLEAR* 12/12/2015 1551   LABSPEC 1.012 12/12/2015 1551   PHURINE 6.0 12/12/2015 1551   GLUCOSEU NEGATIVE 12/12/2015 1551   HGBUR 2+* 12/12/2015 1551   [place] 12/12/2015 1551   KETONESUR NEGATIVE 12/12/2015 1551   PROTEINUR NEGATIVE 12/12/2015 1551   NITRITE NEGATIVE 12/12/2015 1551   LEUKOCYTESUR NEGATIVE 12/12/2015 1551   Urine Drug Screen No results found for: LABOPIA, COCAINSCRNUR, LABBENZ, AMPHETMU, THCU, LABBARB  Alcohol Level No results found for: [place] I have personally reviewed the radiological images below and agree with the radiology interpretations.  Ct Head Wo Contrast 12/12/2015  5.7 mm focus of increased density in the left posterior limb internal capsule suspicious for acute hemorrhage.  There is a 2 mm focus of increased density in the right anterior limb internal capsule, small focus of acute hemorrhage is not excluded.  MRI / MRA HEAD with and without contrast 12/13/2015 6 mm area of acute hemorrhage in the left basal ganglia involving the posterior limb internal capsule and lateral thalamus. This is likely hypertensive bleed.  Underlying cavernoma not completely excluded.  No underlying mass lesion identified. Small foci of chronic micro hemorrhage right posterior temporal lobe. The patient has chronic hypertension. Atrophy and chronic microvascular ischemia. No acute ischemic infarction Mild atherosclerotic disease in the cavernous carotid bilaterally. No significant intracranial stenosis.  2-D echocardiogram 12/13/2015 Study Conclusions - Left ventricle: The cavity size was normal. There was mild concentric hypertrophy.  Systolic function was normal. The estimated ejection fraction was in the range of 55% to 60%. Wall motion was normal; there were no regional wall motion abnormalities. Doppler parameters are consistent with abnormal left ventricular relaxation (grade 1 diastolic dysfunction). - Left atrium: The atrium was mildly dilated.  CUS - Bilateral: 1-39% ICA stenosis. Vertebral artery flow is antegrade.     HISTORY OF PRESENT ILLNESS Leroy [name] is an 82 y.o. male patient who presented to the [facility] ER via EMS earlier this afternoon. He was using chain saw outside, when he noticed sudden onset of numbness in his legs, and also difficulty moving his legs and incoordination. Patient is a poor historian, unable to describe his symptoms involving the legs clearly. It is unclear if he had some coordination problem in his legs. He called EMS and was taken to the ER for further evaluation. Denied any weakness in his upper extremities no vision or speech problems no headaches or neck pain. CT of the head done in the [place] ER showed a tiny 6 mm intraparenchymal hemorrhage in the left basal ganglia. His blood pressure was noted to be highly elevated. He was started on nicardipine drip, and transferred to [facility] neuro ICU for further management. He was noted to be symptomatic prior to transfer, with reported NIHSS 0 per ER physician note.   Date last known well: 12/12/2015 Time last known well: 2 pm tPA Given: No: [place] Leroy [name] is an 82 y.o. male with history of hypertension and hyperlipidemia presenting to [facility] with numbness, weakness and incoordination in his legs. CT of the brain showed a tiny left basal ganglia hemorrhage. The patient was transferred to [facility] where he was admitted to the neuro intensive care unit.  ICH: small left BG ICH likely secondary to hypertension.   MRI- small left BG  ICH  MRA- unremarkable  Carotid Doppler unremarkable  2D Echo - EF 55-60%.  LDL 104  HgbA1c 6.1  SCDs for VTE prophylaxis  Diet Heart Room service appropriate?: Yes; Fluid consistency:: Thin  No antithrombotic prior to admission. Consider resumption of aspirin in the future once hemorrhage resolved.  Ongoing aggressive stroke risk factor management  Therapy recommendations:Outpatient physical therapy  Disposition: Discharged to home in the care of his family  Hypertensive emergency  Blood pressure 188/101 on arrival with neurologic symptoms  Placed on nicardipine drip with systolic blood pressure goal 160-170 by Dr. Burlene Arnt at [facility], lowered to 120-140 on arrrival to [facility]  Relatively quickly lowered and stayed down on cardene. Cardene tapered and discontinued.  Not on BP meds at home prior to admission  Add norvasc 5 mg daily  Hyperlipidemia  Home meds: lipitor 20, resumed in hospital  LDL 104, goal < 70  Lipitor increased to 40 mg daily  Continue statin at discharge  Other Stroke Risk Factors  Advanced age  Smokeless tobacco - advised to abstain  Obesity, Body mass index is 31.27 kg/(m^2).  Other Active Problems  Creatinine 1.16  Troponin 0.05   DISCHARGE EXAM Blood pressure 141/84, pulse 70, temperature 98.8 F (37.1 C), temperature source Oral, resp. rate 16, height 6\' 1"  (1.854 m), weight 107.5 kg (236 lb 15.9 oz), SpO2 96 %. Pleasant elderly caucasian male not in distress. . Afebrile. Head is nontraumatic. Neck is supple without bruit. Cardiac exam no murmur or gallop. Lungs are clear to auscultation. Distal pulses are well felt. Neurological Exam :  Awake alert oriented 3 with normal speech and language function. No aphasia or apraxia dysarthria. Pupils are equal reactive. Extraocular movements are full range without nystagmus. Fundi were not visualized. Vision acuity seems adequate. Visual fields are full to bedside  confrontational testing. Face is symmetric without weakness. Tongue is midline. Cough and gag are normal. Motor system exam reveals no upper or lower extremity drift. Symmetric and equal strength in all 4 extremity. Normal tone and bulk and coordination. Sensation is intact bilaterally. Finger-to-nose and knee to heel coordination accurate. Gait was not tested.   Discharge Diet   Diet Heart Room service appropriate?: Yes; Fluid consistency:: Thin liquids   DISCHARGE PLAN  Disposition:  Discharged to home in the care of his family.  No antithrombotic for secondary stroke prevention due to hemorrhagic stroke.  Follow-up SPARKS,JEFFREY D, MD in 2 weeks.  Follow-up with Dr. Rosalin Hawking in the Stroke Clinic in 2 months.  35 minutes were spent preparing discharge.  Mikey Bussing PA-C [facility] Pager [phone] 12/14/2015, 2:58 PM   I, the attending vascular neurologist, have personally obtained a history, examined the patient, evaluated laboratory data, individually viewed imaging studies and agree with radiology interpretations. I also discussed with pt and daughters at bedside regarding his care plan. Together with the NP/PA, we formulated the assessment and plan of care which reflects our mutual decision.  I have made any additions or clarifications directly to the above note and agree with the findings and plan as currently documented.    Rosalin Hawking, MD PhD Stroke Neurology 12/14/2015 4:07 PM

## 2015-12-30 ENCOUNTER — Other Ambulatory Visit: Payer: Self-pay | Admitting: Internal Medicine

## 2015-12-30 DIAGNOSIS — I612 Nontraumatic intracerebral hemorrhage in hemisphere, unspecified: Secondary | ICD-10-CM

## 2016-01-06 ENCOUNTER — Ambulatory Visit
Admission: RE | Admit: 2016-01-06 | Discharge: 2016-01-06 | Disposition: A | Discharge status: Home / Self Care | Payer: Medicare Other | Source: Ambulatory Visit | Attending: Internal Medicine | Admitting: Internal Medicine

## 2016-01-06 DIAGNOSIS — I612 Nontraumatic intracerebral hemorrhage in hemisphere, unspecified: Secondary | ICD-10-CM | POA: Insufficient documentation

## 2016-01-06 DIAGNOSIS — R9082 White matter disease, unspecified: Secondary | ICD-10-CM | POA: Diagnosis not present

## 2016-01-06 DIAGNOSIS — I6782 Cerebral ischemia: Secondary | ICD-10-CM | POA: Insufficient documentation

## 2016-01-06 DIAGNOSIS — G319 Degenerative disease of nervous system, unspecified: Secondary | ICD-10-CM | POA: Diagnosis not present

## 2016-02-18 ENCOUNTER — Ambulatory Visit: Payer: Medicare Other | Admitting: Neurology

## 2016-04-08 ENCOUNTER — Other Ambulatory Visit: Payer: Self-pay | Admitting: Internal Medicine

## 2016-04-08 DIAGNOSIS — R29898 Other symptoms and signs involving the musculoskeletal system: Secondary | ICD-10-CM | POA: Insufficient documentation

## 2016-04-08 DIAGNOSIS — G250 Essential tremor: Secondary | ICD-10-CM | POA: Insufficient documentation

## 2016-04-08 DIAGNOSIS — R600 Localized edema: Secondary | ICD-10-CM

## 2016-04-09 ENCOUNTER — Ambulatory Visit
Admission: RE | Admit: 2016-04-09 | Discharge: 2016-04-09 | Disposition: A | Discharge status: Home / Self Care | Payer: Medicare Other | Source: Ambulatory Visit | Attending: Internal Medicine | Admitting: Internal Medicine

## 2016-04-09 ENCOUNTER — Other Ambulatory Visit: Payer: Self-pay | Admitting: Internal Medicine

## 2016-04-09 DIAGNOSIS — R600 Localized edema: Secondary | ICD-10-CM

## 2016-04-09 DIAGNOSIS — M7122 Synovial cyst of popliteal space [Baker], left knee: Secondary | ICD-10-CM | POA: Insufficient documentation

## 2016-05-24 ENCOUNTER — Encounter: Payer: Self-pay | Admitting: Emergency Medicine

## 2016-05-24 ENCOUNTER — Emergency Department
Admission: EM | Admit: 2016-05-24 | Discharge: 2016-05-24 | Disposition: A | Discharge status: Home / Self Care | Payer: Medicare Other | Attending: Emergency Medicine | Admitting: Emergency Medicine

## 2016-05-24 DIAGNOSIS — I10 Essential (primary) hypertension: Secondary | ICD-10-CM | POA: Insufficient documentation

## 2016-05-24 DIAGNOSIS — M5441 Lumbago with sciatica, right side: Secondary | ICD-10-CM | POA: Diagnosis not present

## 2016-05-24 DIAGNOSIS — M5431 Sciatica, right side: Secondary | ICD-10-CM

## 2016-05-24 DIAGNOSIS — Z79899 Other long term (current) drug therapy: Secondary | ICD-10-CM | POA: Diagnosis not present

## 2016-05-24 DIAGNOSIS — M62830 Muscle spasm of back: Secondary | ICD-10-CM | POA: Diagnosis not present

## 2016-05-24 DIAGNOSIS — M545 Low back pain: Secondary | ICD-10-CM | POA: Diagnosis present

## 2016-05-24 DIAGNOSIS — F17220 Nicotine dependence, chewing tobacco, uncomplicated: Secondary | ICD-10-CM | POA: Insufficient documentation

## 2016-05-24 HISTORY — DX: Essential (primary) hypertension: I10

## 2016-05-24 MED ORDER — METHYLPREDNISOLONE SODIUM SUCC 125 MG IJ SOLR
80.0000 mg | Freq: Once | INTRAMUSCULAR | Status: AC
  Administered 2016-05-24: 80 mg via INTRAMUSCULAR
  Filled 2016-05-24: qty 2

## 2016-05-24 MED ORDER — CYCLOBENZAPRINE HCL 5 MG PO TABS: 5.0000 mg | ORAL_TABLET | Freq: Three times a day (TID) | ORAL | 0 refills | Status: DC | PRN

## 2016-05-24 NOTE — ED Provider Notes (Signed)
[facility] Emergency Department Provider Note  ____________________________________________  Time seen: Approximately 9:08 AM  I have reviewed the triage vital signs and the nursing notes.   HISTORY  Chief Complaint Leg Pain    HPI Leroy [name] is a 82 y.o. male , NAD, presents to the emergency department with several week history of right lower back pain radiating into the hip. Patient states he has a long history of lower back pain in which she has had surgery many years ago. Over the last couple weeks he has had some right lower back pain that seems to worsen over time. Has not been taking anything over-the-counter completing any supportive care home. Notes that the pain seems to begin in the right upper buttock and move into the right hip and upper thigh. Also notes that his buttocks seems to be "tight " at times. Denies any numbness, weakness, tingling. Has not had any saddle paresthesias nor loss of bowel or bladder control. Denies any falls, injuries or other traumas. Has not noted any rashes, redness or swelling. Denies any swelling, redness or warmth to the lower extremity. Has not had any chest pain or shortness of breath.   Past Medical History:  Diagnosis Date  . Hyperlipidemia   . Hypertension     Patient Active Problem List   Diagnosis Date Noted  . Hypertensive emergency 12/13/2015  . Nontraumatic intracerebral hemorrhage (Leroy [name]) 12/12/2015  . Acute intracerebral hemorrhage ([place]) 12/12/2015    Past Surgical History:  Procedure Laterality Date  . BACK SURGERY    . HERNIA REPAIR      Current Outpatient Rx  . Order #: [id number] Class: Print  . Order #: [id number] Class: Print  . Order #: [id number] Class: Print    Allergies Aspirin and Other  No family history on file.  Social History Social History  Substance Use Topics  . Smoking status: Never Smoker  . Smokeless tobacco: Current User    Types: Chew  . Alcohol use No      Review of Systems  Constitutional: No fever/chills Cardiovascular: No chest pain. Respiratory: No shortness of breath. No wheezing.  Gastrointestinal: No abdominal pain.  No nausea, vomiting.  No diarrhea.   Musculoskeletal: Positive for back pain radiating to right hip.  Skin: Negative for rash, redness, warmth, skin sores. Neurological: Negative for headaches, focal weakness or numbness. No tingling, saddle paresthesias, loss of bowel or bladder control. 10-point ROS otherwise negative.  ____________________________________________   PHYSICAL EXAM:  VITAL SIGNS: ED Triage Vitals [05/24/16 0810]  Enc Vitals Group     BP 140/79     Pulse Rate 64     Resp 20     Temp 98.9 F (37.2 C)     Temp Source Oral     SpO2 99 %     Weight 225 lb (102.1 kg)     Height 6\' 1"  (1.854 m)     Head Circumference      Peak Flow      Pain Score 10     Pain Loc      Pain Edu?      Excl. in [place]?      Constitutional: Alert and oriented. Well appearing and in no acute distress. Eyes: Conjunctivae are normal.  Head: Atraumatic. Neck: Supple with full range of motion. No cervical spine tenderness to palpation. Hematological/Lymphatic/Immunilogical: No cervical lymphadenopathy. Cardiovascular: Normal rate, regular rhythm. Grossly normal heart sounds. Good peripheral circulation with 2+ pulses in bilateral lower extremities. Respiratory: Normal respiratory  effort without tachypnea or retractions. Lungs CTAB with breath sounds noted in all lung fields. No wheeze, rhonchi, rales. Musculoskeletal: Negative straight leg raise bilaterally. Tenderness to deep palpation over the right SI joint. No tenderness with pressure to the pelvic girdle. No midline spine tenderness about the thoracic, lumbar, sacral areas. No lower extremity tenderness nor edema.  No joint effusions. Neurologic:  Normal speech and language. No gross focal neurologic deficits are appreciated. Gait and posture are normal. Skin:   Skin is warm, dry and intact. No rash, redness, warmth, swelling, skin sores noted. Psychiatric: Mood and affect are normal. Speech and behavior are normal. Patient exhibits appropriate insight and judgement.   ____________________________________________   LABS  None ____________________________________________  EKG  None ____________________________________________  RADIOLOGY  None ____________________________________________    PROCEDURES  Procedure(s) performed: None    Medications  methylPREDNISolone sodium succinate (SOLU-MEDROL) 125 mg/2 mL injection 80 mg (80 mg Intramuscular Given 05/24/16 0933)   Patient tolerated Solu-Medrol injection without any immediate side effects or allergies.   ____________________________________________   INITIAL IMPRESSION / ASSESSMENT AND PLAN / ED COURSE  Pertinent labs & imaging results that were available during my care of the patient were reviewed by me and considered in my medical decision making (see chart for details).  Patient's diagnosis is consistent with sciatica right side and muscle spasms. Patient will be discharged home with prescriptions for Flexeril 5 mg to take 1/2 to 1 whole tablet every 8 hours as needed for muscle spasms. During the discussion had with the patient and his family member at the bedside in regards to Flexeril causing somnolence and that he should take the medication sparingly as well as take extra time when he goes from lying to sitting or sitting to standing especially at night. Patient is to follow up with his primary care provider if symptoms persist past this treatment course. Patient is given ED precautions to return to the ED for any worsening or new symptoms.    ____________________________________________  FINAL CLINICAL IMPRESSION(S) / ED DIAGNOSES  Final diagnoses:  Sciatica of right side  Muscle spasm of back      NEW MEDICATIONS STARTED DURING THIS VISIT:  Discharge  Medication List as of 05/24/2016  9:58 AM    START taking these medications   Details  cyclobenzaprine (FLEXERIL) 5 MG tablet Take 1 tablet (5 mg total) by mouth 3 (three) times daily as needed for muscle spasms., Starting Sun 05/24/2016, Print             [name] Feathers, PA-C 05/24/16 1112    Schuyler Amor, MD 05/24/16 [id number]

## 2016-05-24 NOTE — ED Triage Notes (Signed)
Pt presents with right hip pain. States the right side buttocks feels numb and pain is running down the right leg. States it feels like a pinched nerve.

## 2016-05-24 NOTE — ED Notes (Signed)
Pt c/o right hip pain radiating down right leg that started 2-3 weeks ago. Denies any falls or injury. Has a hx of back surgery in the past. Ambulatory at home but only for short distances.  Family member in room states patient is usually very active at home.

## 2016-05-25 DIAGNOSIS — G629 Polyneuropathy, unspecified: Secondary | ICD-10-CM | POA: Insufficient documentation

## 2016-05-28 ENCOUNTER — Other Ambulatory Visit: Payer: Self-pay | Admitting: Internal Medicine

## 2016-05-28 DIAGNOSIS — G629 Polyneuropathy, unspecified: Secondary | ICD-10-CM

## 2016-06-10 ENCOUNTER — Ambulatory Visit
Admission: RE | Admit: 2016-06-10 | Discharge: 2016-06-10 | Disposition: A | Discharge status: Home / Self Care | Payer: Medicare Other | Source: Ambulatory Visit | Attending: Internal Medicine | Admitting: Internal Medicine

## 2016-06-10 DIAGNOSIS — M47816 Spondylosis without myelopathy or radiculopathy, lumbar region: Secondary | ICD-10-CM | POA: Insufficient documentation

## 2016-06-10 DIAGNOSIS — M5136 Other intervertebral disc degeneration, lumbar region: Secondary | ICD-10-CM | POA: Diagnosis not present

## 2016-06-10 DIAGNOSIS — G629 Polyneuropathy, unspecified: Secondary | ICD-10-CM | POA: Diagnosis present

## 2016-06-10 DIAGNOSIS — M2548 Effusion, other site: Secondary | ICD-10-CM | POA: Diagnosis not present

## 2016-06-10 DIAGNOSIS — Z9889 Other specified postprocedural states: Secondary | ICD-10-CM | POA: Diagnosis not present

## 2016-07-09 ENCOUNTER — Other Ambulatory Visit: Payer: Self-pay | Admitting: Neurosurgery

## 2016-07-13 DIAGNOSIS — I454 Nonspecific intraventricular block: Secondary | ICD-10-CM | POA: Insufficient documentation

## 2016-07-13 DIAGNOSIS — R0602 Shortness of breath: Secondary | ICD-10-CM | POA: Insufficient documentation

## 2016-07-13 DIAGNOSIS — Z01810 Encounter for preprocedural cardiovascular examination: Secondary | ICD-10-CM | POA: Insufficient documentation

## 2016-08-03 NOTE — Pre-Procedure Instructions (Signed)
    [facility]  08/03/2016      [facility] - [place], [place] - [address] [place] [address] Phone: [phone] Fax: [phone]    Your procedure is scheduled on Wednesday, October 11th, 2017.  Report to [facility] Admitting at 10:30 A.M.   Call this number if you have problems the morning of surgery:  [phone]   Remember:  Do not eat food or drink liquids after midnight.   Take these medicines the morning of surgery with A SIP OF WATER: Prednisone.  7 days prior to surgery, stop taking: Aspirin, NSAIDS, Aleve, Naproxen, Ibuprofen, Advil, Motrin, BC's, Goody's, Fish oil, all herbal medications, and all vitmains.    Do not wear jewelry.  Do not wear lotions, powders, or colognes, or deoderant.  Men may shave face and neck.  Do not bring valuables to the hospital.  [facility] is not responsible for any belongings or valuables.  Contacts, dentures or bridgework may not be worn into surgery.  Leave your suitcase in the car.  After surgery it may be brought to your room.  For patients admitted to the hospital, discharge time will be determined by your treatment team.  Patients discharged the day of surgery will not be allowed to drive home.   Special instructions:  Preparing for Surgery.   Please read over the following fact sheets that you were given. MRSA Information    [place]- Preparing For Surgery  Before surgery, you can play an important role. Because skin is not sterile, your skin needs to be as free of germs as possible. You can reduce the number of germs on your skin by washing with CHG (chlorahexidine gluconate) Soap before surgery.  CHG is an antiseptic cleaner which kills germs and bonds with the skin to continue killing germs even after washing.  Please do not use if you have an allergy to CHG or antibacterial soaps. If your skin becomes reddened/irritated stop using the CHG.  Do not shave (including  legs and underarms) for at least 48 hours prior to first CHG shower. It is OK to shave your face.  Please follow these instructions carefully.   1. Shower the NIGHT BEFORE SURGERY and the MORNING OF SURGERY with CHG.   2. If you chose to wash your hair, wash your hair first as usual with your normal shampoo.  3. After you shampoo, rinse your hair and body thoroughly to remove the shampoo.  4. Use CHG as you would any other liquid soap. You can apply CHG directly to the skin and wash gently with a scrungie or a clean washcloth.   5. Apply the CHG Soap to your body ONLY FROM THE NECK DOWN.  Do not use on open wounds or open sores. Avoid contact with your eyes, ears, mouth and genitals (private parts). Wash genitals (private parts) with your normal soap.  6. Wash thoroughly, paying special attention to the area where your surgery will be performed.  7. Thoroughly rinse your body with warm water from the neck down.  8. DO NOT shower/wash with your normal soap after using and rinsing off the CHG Soap.  9. Pat yourself dry with a CLEAN TOWEL.   10. Wear CLEAN PAJAMAS   11. Place CLEAN SHEETS on your bed the night of your first shower and DO NOT SLEEP WITH PETS.  Day of Surgery: Do not apply any deodorants/lotions. Please wear clean clothes to the hospital/surgery center.

## 2016-08-04 ENCOUNTER — Encounter (HOSPITAL_COMMUNITY): Payer: Self-pay

## 2016-08-04 ENCOUNTER — Encounter (HOSPITAL_COMMUNITY)
Admission: RE | Admit: 2016-08-04 | Discharge: 2016-08-04 | Disposition: A | Discharge status: Home / Self Care | Payer: Medicare Other | Source: Ambulatory Visit | Attending: Anesthesiology | Admitting: Anesthesiology

## 2016-08-04 ENCOUNTER — Encounter (HOSPITAL_COMMUNITY)
Admission: RE | Admit: 2016-08-04 | Discharge: 2016-08-04 | Disposition: A | Discharge status: Home / Self Care | Payer: Medicare Other | Source: Ambulatory Visit | Attending: Neurosurgery | Admitting: Neurosurgery

## 2016-08-04 DIAGNOSIS — R05 Cough: Secondary | ICD-10-CM

## 2016-08-04 DIAGNOSIS — Z8673 Personal history of transient ischemic attack (TIA), and cerebral infarction without residual deficits: Secondary | ICD-10-CM | POA: Insufficient documentation

## 2016-08-04 DIAGNOSIS — E785 Hyperlipidemia, unspecified: Secondary | ICD-10-CM | POA: Insufficient documentation

## 2016-08-04 DIAGNOSIS — I10 Essential (primary) hypertension: Secondary | ICD-10-CM | POA: Insufficient documentation

## 2016-08-04 DIAGNOSIS — J984 Other disorders of lung: Secondary | ICD-10-CM | POA: Insufficient documentation

## 2016-08-04 DIAGNOSIS — R059 Cough, unspecified: Secondary | ICD-10-CM

## 2016-08-04 DIAGNOSIS — Z01818 Encounter for other preprocedural examination: Secondary | ICD-10-CM | POA: Diagnosis present

## 2016-08-04 HISTORY — DX: Malignant (primary) neoplasm, unspecified: C80.1

## 2016-08-04 HISTORY — DX: Cerebral infarction, unspecified: I63.9

## 2016-08-04 HISTORY — DX: Gastric ulcer, unspecified as acute or chronic, without hemorrhage or perforation: K25.9

## 2016-08-04 LAB — CBC
HCT: 35.8 % — ABNORMAL LOW (ref 39.0–52.0)
Hemoglobin: 11.8 g/dL — ABNORMAL LOW (ref 13.0–17.0)
MCH: 29.9 pg (ref 26.0–34.0)
MCHC: 33 g/dL (ref 30.0–36.0)
MCV: 90.9 fL (ref 78.0–100.0)
PLATELETS: 260 10*3/uL (ref 150–400)
RBC: 3.94 MIL/uL — AB (ref 4.22–5.81)
RDW: 13.1 % (ref 11.5–15.5)
WBC: 9.9 10*3/uL (ref 4.0–10.5)

## 2016-08-04 LAB — BASIC METABOLIC PANEL
Anion gap: 7 (ref 5–15)
BUN: 27 mg/dL — ABNORMAL HIGH (ref 6–20)
CHLORIDE: 105 mmol/L (ref 101–111)
CO2: 25 mmol/L (ref 22–32)
CREATININE: 1.4 mg/dL — AB (ref 0.61–1.24)
Calcium: 9 mg/dL (ref 8.9–10.3)
GFR calc non Af Amer: 45 mL/min — ABNORMAL LOW (ref >60)
GFR, EST AFRICAN AMERICAN: 52 mL/min — AB (ref >60)
GLUCOSE: 102 mg/dL — AB (ref 65–99)
Potassium: 3.4 mmol/L — ABNORMAL LOW (ref 3.5–5.1)
Sodium: 137 mmol/L (ref 135–145)

## 2016-08-04 LAB — SURGICAL PCR SCREEN
MRSA, PCR: NEGATIVE
Staphylococcus aureus: NEGATIVE

## 2016-08-04 NOTE — Progress Notes (Signed)
   08/04/16 0824  OBSTRUCTIVE SLEEP APNEA  Have you ever been diagnosed with sleep apnea through a sleep study? No  Do you snore loudly (loud enough to be heard through closed doors)?  1  Do you often feel tired, fatigued, or sleepy during the daytime (such as falling asleep during driving or talking to someone)? 0  Has anyone observed you stop breathing during your sleep? 1  Do you have, or are you being treated for high blood pressure? 1  BMI more than 35 kg/m2? 0  Age > 50 (1-yes) 1  Neck circumference greater than:Male 16 inches or larger, Male 17inches or larger? 1  Male Gender (Yes=1) 1  Obstructive Sleep Apnea Score 6  Score 5 or greater  Results sent to PCP

## 2016-08-04 NOTE — Progress Notes (Signed)
PCP - Fulton Reek Cardiologist - Saw Dr. Saralyn Pilar for clearance   Chest x-ray - 08/04/16 - patient has a cough  EKG - 12/15/15 Stress Test - requesting ECHO - 12/13/15 Cardiac Cath - denies  Sleep Apnea score of 6 results sent to PCP VIA Epic  Will send to anesthesia for review  Patient denies shortness of breath, fever, and chest pain at PAT appointment

## 2016-08-05 NOTE — Progress Notes (Signed)
Anesthesia Chart Review: Patient is a 82 year old male scheduled for L4-5 laminectomy and foraminotomy on 08/12/16 by Dr. Arnoldo Morale.  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 OSA screening score was 6.   PCP is Dr. Fulton Reek. Cardiologist is Dr. Saralyn Pilar with [facility] (see Care Everywhere). Based on recent stress test results, he felt patient should be "low and acceptable risk for surgery."  Med include Lipitor, Hyzaar.   BP 131/63   Temp 36.5 C (Oral)   Resp 20   Ht 6\' 1"  (1.854 m)   Wt 224 lb 14.4 oz (102 kg)   SpO2 97%   BMI 29.67 kg/m   01/09/16 EKG [facility]): SR with first degree AVB, LAD, non-specific intraventricular block. (read as left BBB on 07/17/16 stress ECG).   07/17/16 Nuclear stress test ([facility]; Care Everywhere): Result Impression: 1.Left ventricular function 2.Normal wall motion, LVEF 57% 3.Mild fixed apical wall defect, soft tissue attenuation versus scar, no  evidence for ischemia 4.  Rest ECG: NSR, left bundle branch block.   12/13/15 Echo: Study Conclusions - Left ventricle: The cavity size was normal. There was mild   concentric hypertrophy. Systolic function was normal. The   estimated ejection fraction was in the range of 55% to 60%. Wall   motion was normal; there were no regional wall motion   abnormalities. Doppler parameters are consistent with abnormal   left ventricular relaxation (grade 1 diastolic dysfunction). - Left atrium: The atrium was mildly dilated.  12/13/15 Carotid U/S: Summary: - The vertebral arteries appear patent with antegrade flow. - Findings consistent with 1- 39 percent stenosis involving the   right internal carotid artery and the left internal carotid   artery.  12/13/15 Brain MRI: IMPRESSION: 1. 6 mm area of acute hemorrhage in the left basal ganglia involving the  posterior limb internal capsule and lateral thalamus. This is likely hypertensive bleed. Underlying cavernoma not completely excluded. No underlying mass lesion identified. 2. Small foci of chronic micro hemorrhage right posterior temporal lobe. The patient has chronic hypertension. 3. Atrophy and chronic microvascular ischemia. No acute ischemic infarction 4. Mild atherosclerotic disease in the cavernous carotid bilaterally. No significant intracranial stenosis.  08/04/16 CXR: FINDINGS: Lingular density likely reflects scarring. Heart is normal size. Right lung is clear. No effusions or acute bony abnormality. IMPRESSION: Lingular scarring.  No active disease.  Preoperative labs noted. K 3.4, BUN 27, Cr 1.40 (Cr 1.1-1.4 since 07/12/15; see Care Everywhere), glucose 102, H/H 11.8/35.8.   Patient has cardiac clearance. CVA > 6 months ago. BP acceptable at PAT. If no acute changes then I would anticipate that he could proceed as planned.  George Hugh [facility] Short Stay Center/Anesthesiology Phone [phone] 08/05/2016 11:01 AM

## 2016-08-12 ENCOUNTER — Encounter (HOSPITAL_COMMUNITY)
Admission: RE | Disposition: A | Discharge status: Home / Self Care | Payer: Self-pay | Source: Ambulatory Visit | Attending: Neurosurgery

## 2016-08-12 ENCOUNTER — Ambulatory Visit (HOSPITAL_COMMUNITY): Payer: Medicare Other | Admitting: Certified Registered Nurse Anesthetist

## 2016-08-12 ENCOUNTER — Ambulatory Visit (HOSPITAL_COMMUNITY): Payer: Medicare Other

## 2016-08-12 ENCOUNTER — Observation Stay (HOSPITAL_COMMUNITY)
Admission: RE | Admit: 2016-08-12 | Discharge: 2016-08-13 | Disposition: A | Discharge status: Home / Self Care | Payer: Medicare Other | Source: Ambulatory Visit | Attending: Neurosurgery | Admitting: Neurosurgery

## 2016-08-12 ENCOUNTER — Ambulatory Visit (HOSPITAL_COMMUNITY): Payer: Medicare Other | Admitting: Vascular Surgery

## 2016-08-12 ENCOUNTER — Encounter (HOSPITAL_COMMUNITY): Payer: Self-pay | Admitting: Certified Registered Nurse Anesthetist

## 2016-08-12 DIAGNOSIS — Z886 Allergy status to analgesic agent status: Secondary | ICD-10-CM | POA: Diagnosis not present

## 2016-08-12 DIAGNOSIS — I10 Essential (primary) hypertension: Secondary | ICD-10-CM | POA: Insufficient documentation

## 2016-08-12 DIAGNOSIS — Z888 Allergy status to other drugs, medicaments and biological substances status: Secondary | ICD-10-CM | POA: Insufficient documentation

## 2016-08-12 DIAGNOSIS — I69359 Hemiplegia and hemiparesis following cerebral infarction affecting unspecified side: Secondary | ICD-10-CM | POA: Insufficient documentation

## 2016-08-12 DIAGNOSIS — Z85828 Personal history of other malignant neoplasm of skin: Secondary | ICD-10-CM | POA: Insufficient documentation

## 2016-08-12 DIAGNOSIS — Z8719 Personal history of other diseases of the digestive system: Secondary | ICD-10-CM | POA: Insufficient documentation

## 2016-08-12 DIAGNOSIS — R262 Difficulty in walking, not elsewhere classified: Secondary | ICD-10-CM

## 2016-08-12 DIAGNOSIS — E785 Hyperlipidemia, unspecified: Secondary | ICD-10-CM | POA: Diagnosis not present

## 2016-08-12 DIAGNOSIS — M48062 Spinal stenosis, lumbar region with neurogenic claudication: Principal | ICD-10-CM | POA: Insufficient documentation

## 2016-08-12 DIAGNOSIS — M4316 Spondylolisthesis, lumbar region: Secondary | ICD-10-CM | POA: Diagnosis not present

## 2016-08-12 DIAGNOSIS — Z419 Encounter for procedure for purposes other than remedying health state, unspecified: Secondary | ICD-10-CM

## 2016-08-12 DIAGNOSIS — M5416 Radiculopathy, lumbar region: Secondary | ICD-10-CM | POA: Insufficient documentation

## 2016-08-12 HISTORY — PX: LUMBAR LAMINECTOMY/DECOMPRESSION MICRODISCECTOMY: SHX5026

## 2016-08-12 SURGERY — LUMBAR LAMINECTOMY/DECOMPRESSION MICRODISCECTOMY 1 LEVEL
Anesthesia: General

## 2016-08-12 MED ORDER — HYDROMORPHONE HCL 1 MG/ML IJ SOLN: 0.2500 mg | INTRAMUSCULAR | Status: DC | PRN

## 2016-08-12 MED ORDER — ACETAMINOPHEN 650 MG RE SUPP: 650.0000 mg | RECTAL | Status: DC | PRN

## 2016-08-12 MED ORDER — CHLORHEXIDINE GLUCONATE CLOTH 2 % EX PADS
6.0000 | MEDICATED_PAD | Freq: Once | CUTANEOUS | Status: DC
Start: 2016-08-12 — End: 2016-08-12

## 2016-08-12 MED ORDER — ALUM & MAG HYDROXIDE-SIMETH 200-200-20 MG/5ML PO SUSP: 30.0000 mL | Freq: Four times a day (QID) | ORAL | Status: DC | PRN

## 2016-08-12 MED ORDER — ONDANSETRON HCL 4 MG/2ML IJ SOLN: 4.0000 mg | INTRAMUSCULAR | Status: DC | PRN

## 2016-08-12 MED ORDER — ARTIFICIAL TEARS OP OINT
TOPICAL_OINTMENT | OPHTHALMIC | Status: AC
  Filled 2016-08-12: qty 3.5

## 2016-08-12 MED ORDER — LIDOCAINE HCL (CARDIAC) 20 MG/ML IV SOLN
INTRAVENOUS | Status: DC | PRN
  Administered 2016-08-12: 100 mg via INTRAVENOUS

## 2016-08-12 MED ORDER — BISACODYL 10 MG RE SUPP: 10.0000 mg | Freq: Every day | RECTAL | Status: DC | PRN

## 2016-08-12 MED ORDER — ATORVASTATIN CALCIUM 20 MG PO TABS: 40.0000 mg | ORAL_TABLET | Freq: Every day | ORAL | Status: DC

## 2016-08-12 MED ORDER — CEFAZOLIN SODIUM-DEXTROSE 2-4 GM/100ML-% IV SOLN
2.0000 g | Freq: Three times a day (TID) | INTRAVENOUS | Status: AC
  Administered 2016-08-12 – 2016-08-13 (×2): 2 g via INTRAVENOUS
  Filled 2016-08-12 (×2): qty 100

## 2016-08-12 MED ORDER — MEPERIDINE HCL 25 MG/ML IJ SOLN: 6.2500 mg | INTRAMUSCULAR | Status: DC | PRN

## 2016-08-12 MED ORDER — SUGAMMADEX SODIUM 200 MG/2ML IV SOLN
INTRAVENOUS | Status: DC | PRN
  Administered 2016-08-12: 200 mg via INTRAVENOUS

## 2016-08-12 MED ORDER — LACTATED RINGERS IV SOLN
INTRAVENOUS | Status: DC
  Administered 2016-08-12 (×2): via INTRAVENOUS

## 2016-08-12 MED ORDER — PROPOFOL 10 MG/ML IV BOLUS
INTRAVENOUS | Status: DC | PRN
  Administered 2016-08-12: 120 mg via INTRAVENOUS

## 2016-08-12 MED ORDER — ROCURONIUM BROMIDE 100 MG/10ML IV SOLN
INTRAVENOUS | Status: DC | PRN
  Administered 2016-08-12: 50 mg via INTRAVENOUS
  Administered 2016-08-12: 10 mg via INTRAVENOUS

## 2016-08-12 MED ORDER — EPHEDRINE SULFATE-NACL 50-0.9 MG/10ML-% IV SOSY
PREFILLED_SYRINGE | INTRAVENOUS | Status: DC | PRN
  Administered 2016-08-12: 5 mg via INTRAVENOUS
  Administered 2016-08-12 (×3): 10 mg via INTRAVENOUS

## 2016-08-12 MED ORDER — CEFAZOLIN SODIUM-DEXTROSE 2-4 GM/100ML-% IV SOLN
2.0000 g | INTRAVENOUS | Status: AC
  Administered 2016-08-12: 2 g via INTRAVENOUS

## 2016-08-12 MED ORDER — LOSARTAN POTASSIUM-HCTZ 100-25 MG PO TABS: 1.0000 | ORAL_TABLET | Freq: Every day | ORAL | Status: DC

## 2016-08-12 MED ORDER — LOSARTAN POTASSIUM 50 MG PO TABS
100.0000 mg | ORAL_TABLET | Freq: Every day | ORAL | Status: DC
  Filled 2016-08-12: qty 2

## 2016-08-12 MED ORDER — ONDANSETRON HCL 4 MG/2ML IJ SOLN: 4.0000 mg | Freq: Once | INTRAMUSCULAR | Status: DC | PRN

## 2016-08-12 MED ORDER — HEMOSTATIC AGENTS (NO CHARGE) OPTIME
TOPICAL | Status: DC | PRN
  Administered 2016-08-12: 1 via TOPICAL

## 2016-08-12 MED ORDER — THROMBIN 5000 UNITS EX SOLR
CUTANEOUS | Status: AC
  Filled 2016-08-12: qty 10000

## 2016-08-12 MED ORDER — FENTANYL CITRATE (PF) 100 MCG/2ML IJ SOLN
INTRAMUSCULAR | Status: AC
  Filled 2016-08-12: qty 4

## 2016-08-12 MED ORDER — PHENOL 1.4 % MT LIQD: 1.0000 | OROMUCOSAL | Status: DC | PRN

## 2016-08-12 MED ORDER — MENTHOL 3 MG MT LOZG: 1.0000 | LOZENGE | OROMUCOSAL | Status: DC | PRN

## 2016-08-12 MED ORDER — CEFAZOLIN SODIUM-DEXTROSE 2-4 GM/100ML-% IV SOLN
INTRAVENOUS | Status: AC
  Filled 2016-08-12: qty 100

## 2016-08-12 MED ORDER — BACITRACIN ZINC 500 UNIT/GM EX OINT
TOPICAL_OINTMENT | CUTANEOUS | Status: DC | PRN
  Administered 2016-08-12: 1 via TOPICAL

## 2016-08-12 MED ORDER — ARTIFICIAL TEARS OP OINT
TOPICAL_OINTMENT | OPHTHALMIC | Status: DC | PRN
  Administered 2016-08-12: 1 via OPHTHALMIC

## 2016-08-12 MED ORDER — ONDANSETRON HCL 4 MG/2ML IJ SOLN
INTRAMUSCULAR | Status: AC
  Filled 2016-08-12: qty 2

## 2016-08-12 MED ORDER — SUGAMMADEX SODIUM 200 MG/2ML IV SOLN
INTRAVENOUS | Status: AC
  Filled 2016-08-12: qty 2

## 2016-08-12 MED ORDER — HYDROCODONE-ACETAMINOPHEN 5-325 MG PO TABS: 1.0000 | ORAL_TABLET | ORAL | Status: DC | PRN

## 2016-08-12 MED ORDER — ONDANSETRON HCL 4 MG/2ML IJ SOLN
INTRAMUSCULAR | Status: DC | PRN
  Administered 2016-08-12: 4 mg via INTRAVENOUS

## 2016-08-12 MED ORDER — EPHEDRINE 5 MG/ML INJ
INTRAVENOUS | Status: AC
  Filled 2016-08-12: qty 10

## 2016-08-12 MED ORDER — BUPIVACAINE-EPINEPHRINE (PF) 0.5% -1:200000 IJ SOLN
INTRAMUSCULAR | Status: AC
  Filled 2016-08-12: qty 30

## 2016-08-12 MED ORDER — DIAZEPAM 5 MG PO TABS: 5.0000 mg | ORAL_TABLET | Freq: Four times a day (QID) | ORAL | Status: DC | PRN

## 2016-08-12 MED ORDER — ACETAMINOPHEN 325 MG PO TABS: 650.0000 mg | ORAL_TABLET | ORAL | Status: DC | PRN

## 2016-08-12 MED ORDER — ROCURONIUM BROMIDE 10 MG/ML (PF) SYRINGE
PREFILLED_SYRINGE | INTRAVENOUS | Status: AC
  Filled 2016-08-12: qty 10

## 2016-08-12 MED ORDER — OXYCODONE-ACETAMINOPHEN 5-325 MG PO TABS: 1.0000 | ORAL_TABLET | ORAL | Status: DC | PRN

## 2016-08-12 MED ORDER — SODIUM CHLORIDE 0.9 % IR SOLN
Status: DC | PRN
  Administered 2016-08-12: 15:00:00

## 2016-08-12 MED ORDER — MORPHINE SULFATE (PF) 2 MG/ML IV SOLN: 1.0000 mg | INTRAVENOUS | Status: DC | PRN

## 2016-08-12 MED ORDER — CHLORHEXIDINE GLUCONATE CLOTH 2 % EX PADS: 6.0000 | MEDICATED_PAD | Freq: Once | CUTANEOUS | Status: DC

## 2016-08-12 MED ORDER — LACTATED RINGERS IV SOLN: INTRAVENOUS | Status: DC

## 2016-08-12 MED ORDER — BUPIVACAINE-EPINEPHRINE (PF) 0.5% -1:200000 IJ SOLN
INTRAMUSCULAR | Status: DC | PRN
  Administered 2016-08-12: 10 mL

## 2016-08-12 MED ORDER — LIDOCAINE 2% (20 MG/ML) 5 ML SYRINGE
INTRAMUSCULAR | Status: AC
  Filled 2016-08-12: qty 5

## 2016-08-12 MED ORDER — THROMBIN 5000 UNITS EX SOLR
CUTANEOUS | Status: DC | PRN
  Administered 2016-08-12 (×2): 5000 [IU] via TOPICAL

## 2016-08-12 MED ORDER — HYDROCHLOROTHIAZIDE 25 MG PO TABS
25.0000 mg | ORAL_TABLET | Freq: Every day | ORAL | Status: DC
  Filled 2016-08-12: qty 1

## 2016-08-12 MED ORDER — 0.9 % SODIUM CHLORIDE (POUR BTL) OPTIME
TOPICAL | Status: DC | PRN
  Administered 2016-08-12: 1000 mL

## 2016-08-12 MED ORDER — BACITRACIN ZINC 500 UNIT/GM EX OINT
TOPICAL_OINTMENT | CUTANEOUS | Status: AC
  Filled 2016-08-12: qty 28.35

## 2016-08-12 MED ORDER — FENTANYL CITRATE (PF) 100 MCG/2ML IJ SOLN
INTRAMUSCULAR | Status: DC | PRN
  Administered 2016-08-12: 200 ug via INTRAVENOUS

## 2016-08-12 MED ORDER — DOCUSATE SODIUM 100 MG PO CAPS
100.0000 mg | ORAL_CAPSULE | Freq: Two times a day (BID) | ORAL | Status: DC
  Administered 2016-08-12: 100 mg via ORAL
  Filled 2016-08-12: qty 1

## 2016-08-12 SURGICAL SUPPLY — 50 items
BAG DECANTER FOR FLEXI CONT (MISCELLANEOUS) ×2 IMPLANT
BENZOIN TINCTURE PRP APPL 2/3 (GAUZE/BANDAGES/DRESSINGS) ×2 IMPLANT
BLADE CLIPPER SURG (BLADE) IMPLANT
BUR ACORN 6.0 (BURR) ×2 IMPLANT
BUR MATCHSTICK NEURO 3.0 LAGG (BURR) ×2 IMPLANT
BUR PRECISION FLUTE 6.0 (BURR) ×2 IMPLANT
CANISTER SUCT 3000ML PPV (MISCELLANEOUS) ×2 IMPLANT
DRAPE LAPAROTOMY 100X72X124 (DRAPES) ×2 IMPLANT
DRAPE MICROSCOPE LEICA (MISCELLANEOUS) IMPLANT
DRAPE POUCH INSTRU U-SHP 10X18 (DRAPES) ×2 IMPLANT
DRAPE SURG 17X23 STRL (DRAPES) ×8 IMPLANT
DURASEAL APPLICATOR TIP (TIP) ×2 IMPLANT
DURASEAL SPINE SEALANT 3ML (MISCELLANEOUS) ×2 IMPLANT
ELECT BLADE 4.0 EZ CLEAN MEGAD (MISCELLANEOUS) ×2
ELECT REM PT RETURN 9FT ADLT (ELECTROSURGICAL) ×2
ELECTRODE BLDE 4.0 EZ CLN MEGD (MISCELLANEOUS) ×1 IMPLANT
ELECTRODE REM PT RTRN 9FT ADLT (ELECTROSURGICAL) ×1 IMPLANT
GAUZE SPONGE 4X4 12PLY STRL (GAUZE/BANDAGES/DRESSINGS) ×2 IMPLANT
GAUZE SPONGE 4X4 16PLY XRAY LF (GAUZE/BANDAGES/DRESSINGS) IMPLANT
GLOVE BIO SURGEON STRL SZ8 (GLOVE) ×2 IMPLANT
GLOVE BIO SURGEON STRL SZ8.5 (GLOVE) ×2 IMPLANT
GLOVE BIOGEL PI IND STRL 7.5 (GLOVE) ×1 IMPLANT
GLOVE BIOGEL PI INDICATOR 7.5 (GLOVE) ×1
GLOVE EXAM NITRILE LRG STRL (GLOVE) IMPLANT
GLOVE EXAM NITRILE XL STR (GLOVE) IMPLANT
GLOVE EXAM NITRILE XS STR PU (GLOVE) IMPLANT
GLOVE SURG SS PI 7.0 STRL IVOR (GLOVE) ×2 IMPLANT
GOWN STRL REUS W/ TWL LRG LVL3 (GOWN DISPOSABLE) IMPLANT
GOWN STRL REUS W/ TWL XL LVL3 (GOWN DISPOSABLE) ×1 IMPLANT
GOWN STRL REUS W/TWL 2XL LVL3 (GOWN DISPOSABLE) IMPLANT
GOWN STRL REUS W/TWL LRG LVL3 (GOWN DISPOSABLE)
GOWN STRL REUS W/TWL XL LVL3 (GOWN DISPOSABLE) ×1
KIT BASIN OR (CUSTOM PROCEDURE TRAY) ×2 IMPLANT
KIT ROOM TURNOVER OR (KITS) ×2 IMPLANT
NEEDLE HYPO 21X1.5 SAFETY (NEEDLE) IMPLANT
NEEDLE HYPO 22GX1.5 SAFETY (NEEDLE) ×2 IMPLANT
NS IRRIG 1000ML POUR BTL (IV SOLUTION) ×2 IMPLANT
PACK LAMINECTOMY NEURO (CUSTOM PROCEDURE TRAY) ×2 IMPLANT
PAD ARMBOARD 7.5X6 YLW CONV (MISCELLANEOUS) ×6 IMPLANT
PATTIES SURGICAL .5 X1 (DISPOSABLE) IMPLANT
RUBBERBAND STERILE (MISCELLANEOUS) ×4 IMPLANT
SPONGE SURGIFOAM ABS GEL SZ50 (HEMOSTASIS) ×2 IMPLANT
STRIP CLOSURE SKIN 1/2X4 (GAUZE/BANDAGES/DRESSINGS) ×2 IMPLANT
SUT VIC AB 1 CT1 18XBRD ANBCTR (SUTURE) ×2 IMPLANT
SUT VIC AB 1 CT1 8-18 (SUTURE) ×2
SUT VIC AB 2-0 CP2 18 (SUTURE) ×4 IMPLANT
TAPE CLOTH SURG 4X10 WHT LF (GAUZE/BANDAGES/DRESSINGS) ×2 IMPLANT
TOWEL OR 17X24 6PK STRL BLUE (TOWEL DISPOSABLE) ×2 IMPLANT
TOWEL OR 17X26 10 PK STRL BLUE (TOWEL DISPOSABLE) ×2 IMPLANT
WATER STERILE IRR 1000ML POUR (IV SOLUTION) ×2 IMPLANT

## 2016-08-12 NOTE — H&P (Signed)
Subjective: The patient is an 82 year old white male had a previous lumbar laminectomy by another physician about 10 years ago. He has complained of increasing back, buttock and leg weakness. Is also noted some incontinence of stool. He was worked up with a lumbar MRI demonstrated severe stenosis at L4-5. I discussed the various treatment options with the patient and his family including surgery. He has decided to proceed with a L4-5 laminectomy.   Past Medical History:  Diagnosis Date  . Cancer ([place])    skin cancer face  . Hyperlipidemia   . Hypertension   . Stomach ulcer   . Stroke [facility])    feb 2017, weakness in legs,     Past Surgical History:  Procedure Laterality Date  . BACK SURGERY    . EYE SURGERY     cataracts  . HERNIA REPAIR      Allergies  Allergen Reactions  . Aspirin Other (See Comments)    He has bleeding ulcers  . Other Other (See Comments)    UNSPECIFIED REACTION  Doesn't like to take pain medication     Social History  Substance Use Topics  . Smoking status: Never Smoker  . Smokeless tobacco: Current User    Types: Chew  . Alcohol use No    History reviewed. No pertinent family history. Prior to Admission medications   Medication Sig Start Date End Date Taking? Authorizing Provider  atorvastatin (LIPITOR) 40 MG tablet Take 1 tablet (40 mg total) by mouth daily. 12/14/15  Yes David L Rinehuls, PA-C  cefUROXime (CEFTIN) 250 MG tablet Take 250 mg by mouth 2 (two) times daily with a meal.   Yes Historical Provider, MD  losartan-hydrochlorothiazide (HYZAAR) 100-25 MG tablet Take 1 tablet by mouth daily. 06/16/16  Yes Historical Provider, MD  predniSONE (STERAPRED UNI-PAK 21 TAB) 10 MG (21) TBPK tablet Take 10 mg by mouth daily.   Yes Historical Provider, MD  amLODipine (NORVASC) 5 MG tablet Take 1 tablet (5 mg total) by mouth daily. Patient not taking: Reported on 07/30/2016 12/14/15   [name] L Rinehuls, PA-C  cyclobenzaprine (FLEXERIL) 5 MG tablet Take 1 tablet  (5 mg total) by mouth 3 (three) times daily as needed for muscle spasms. Patient not taking: Reported on 07/30/2016 05/24/16   Jami L Hagler, PA-C     Review of Systems  Positive ROS: As above  All other systems have been reviewed and were otherwise negative with the exception of those mentioned in the HPI and as above.  Objective: Vital signs in last 24 hours: Temp:  [97.5 F (36.4 C)] 97.5 F (36.4 C) (10/11 1010) Pulse Rate:  [52] 52 (10/11 1010) Resp:  [18] 18 (10/11 1010) BP: (128)/(71) 128/71 (10/11 1010) SpO2:  [99 %] 99 % (10/11 1010) Weight:  [102 kg (224 lb 14.4 oz)] 102 kg (224 lb 14.4 oz) (10/11 1010)  General Appearance: Alert, cooperative, no distress, Head: Normocephalic, without obvious abnormality, atraumatic Eyes: PERRL, conjunctiva/corneas clear, EOM's intact,    Ears: Normal  Throat: Normal  Neck: Supple, symmetrical, trachea midline, no adenopathy; thyroid: No enlargement/tenderness/nodules; no carotid bruit or JVD Back: Symmetric, no curvature, ROM normal, no CVA tenderness. The patient's lumbar incision is well healed. Lungs: Clear to auscultation bilaterally, respirations unlabored Heart: Regular rate and rhythm, no murmur, rub or gallop Abdomen: Soft, non-tender,, no masses, no organomegaly Extremities: Extremities normal, atraumatic, no cyanosis or edema Pulses: 2+ and symmetric all extremities Skin: Skin color, texture, turgor normal, no rashes or lesions  NEUROLOGIC:  Mental status: alert and oriented, no aphasia, good attention span, Fund of knowledge/ memory ok Motor Exam - grossly normal Sensory Exam - grossly normal Reflexes:  Coordination - grossly normal Gait - grossly normal Balance - grossly normal Cranial Nerves: I: smell Not tested  II: visual acuity  OS: Normal  OD: Normal   II: visual fields Full to confrontation  II: pupils Equal, round, reactive to light  III,VII: ptosis None  III,IV,VI: extraocular muscles  Full ROM  V:  mastication Normal  V: facial light touch sensation  Normal  V,VII: corneal reflex  Present  VII: facial muscle function - upper  Normal  VII: facial muscle function - lower Normal  VIII: hearing Not tested  IX: soft palate elevation  Normal  IX,X: gag reflex Present  XI: trapezius strength  5/5  XI: sternocleidomastoid strength 5/5  XI: neck flexion strength  5/5  XII: tongue strength  Normal    Data Review Lab Results  Component Value Date   WBC 9.9 08/04/2016   HGB 11.8 (L) 08/04/2016   HCT 35.8 (L) 08/04/2016   MCV 90.9 08/04/2016   PLT 260 08/04/2016   Lab Results  Component Value Date   NA 137 08/04/2016   K 3.4 (L) 08/04/2016   CL 105 08/04/2016   CO2 25 08/04/2016   BUN 27 (H) 08/04/2016   CREATININE 1.40 (H) 08/04/2016   GLUCOSE 102 (H) 08/04/2016   Lab Results  Component Value Date   INR 1.07 12/12/2015    Assessment/Plan: L4-5 spinal stenosis, lumbago, lumbar radiculopathy. I discussed the situation with the patient and his family. I have reviewed his imaging studies with him and pointed out the abnormalities. We have discussed the various treatment options including surgery. I have described the surgical treatment option of an L4-5 laminectomy. I have shown him surgical models. We have discussed the risks, benefits, alternatives, expected postoperative course, and likelihood of achieving her goals with surgery. I have answered all the patient's questions. He has decided to proceed with surgery.   Derwood Becraft D 08/12/2016 1:52 PM

## 2016-08-12 NOTE — Anesthesia Procedure Notes (Signed)
Procedure Name: Intubation Date/Time: 08/12/2016 2:19 PM Performed by: Willeen Cass P Pre-anesthesia Checklist: Patient identified, Emergency Drugs available, Suction available, Patient being monitored and Timeout performed Patient Re-evaluated:Patient Re-evaluated prior to inductionOxygen Delivery Method: Circle system utilized Preoxygenation: Pre-oxygenation with 100% oxygen Intubation Type: IV induction Ventilation: Mask ventilation without difficulty Laryngoscope Size: Mac and 4 Grade View: Grade I Tube type: Oral Tube size: 7.5 mm Number of attempts: 1 Airway Equipment and Method: Stylet Placement Confirmation: ETT inserted through vocal cords under direct vision,  positive ETCO2 and breath sounds checked- equal and bilateral Secured at: 23 cm Tube secured with: Tape Dental Injury: Teeth and Oropharynx as per pre-operative assessment

## 2016-08-12 NOTE — Transfer of Care (Signed)
Immediate Anesthesia Transfer of Care Note  Patient: Leroy [name]  Procedure(s) Performed: Procedure(s) with comments: LAMINECTOMY AND FORAMINOTOMY LUMBAR FOUR - LUMBAR FIVE (N/A) - LAMINECTOMY AND FORAMINOTOMY L4-L5  Patient Location: PACU  Anesthesia Type:General  Level of Consciousness: awake, alert , oriented and patient cooperative  Airway & Oxygen Therapy: Patient Spontanous Breathing and Patient connected to nasal cannula oxygen  Post-op Assessment: Report given to RN and Post -op Vital signs reviewed and stable  Post vital signs: Reviewed and stable  Last Vitals:  Vitals:   08/12/16 1010  BP: 128/71  Pulse: (!) 52  Resp: 18  Temp: 36.4 C    Last Pain:  Vitals:   08/12/16 1010  TempSrc: Oral         Complications: No apparent anesthesia complications

## 2016-08-12 NOTE — Anesthesia Preprocedure Evaluation (Addendum)
Anesthesia Evaluation  Patient identified by MRN, date of birth, ID band Patient awake    Reviewed: Allergy & Precautions, NPO status , Patient's Chart, lab work & pertinent test results  Airway Mallampati: II  TM Distance: >3 FB Neck ROM: Full    Dental  (+) Dental Advisory Given   Pulmonary    Pulmonary exam normal        Cardiovascular hypertension, Pt. on medications Normal cardiovascular exam     Neuro/Psych    GI/Hepatic   Endo/Other    Renal/GU      Musculoskeletal   Abdominal   Peds  Hematology   Anesthesia Other Findings   Reproductive/Obstetrics                          Anesthesia Physical Anesthesia Plan  ASA: II  Anesthesia Plan: General   Post-op Pain Management:    Induction: Intravenous  Airway Management Planned: Oral ETT  Additional Equipment:   Intra-op Plan: Utilization of Controlled Hypotension per surrgeon request  Post-operative Plan: Extubation in OR  Informed Consent:   Plan Discussed with: CRNA and Surgeon  Anesthesia Plan Comments:        Anesthesia Quick Evaluation

## 2016-08-12 NOTE — Op Note (Signed)
Brief history: The patient is an 82 year old white male who's had prior surgery by another physician years ago. He has developed recurrent back, buttock, and leg pain and weakness. He's also has some incontinence. He was worked up with a lumbar MRI which demonstrated severe spinal stenosis at L4-5. I discussed the situation with the patient and his family. He has decided to proceed with a decompressive laminectomy after weighing the risks, benefits, and alternatives to surgery.  Preoperative diagnosis: L4-5 spinal stenosis, lumbago, lumbar radiculopathy, neurogenic claudication  Postoperative diagnosis: As above  Procedure: Redo L4-5 laminectomy to decompress the bilateral L4 and L5 nerve roots using micro-dissection  Surgeon: Dr. Earle Gell  Asst.: Dr. Sherley Bounds  Anesthesia: Gen. endotracheal  Estimated blood loss: 100 mL  Drains: None  Complications: None  Description of procedure: The patient was brought to the operating room by the anesthesia team. General endotracheal anesthesia was induced. The patient was turned to the prone position on the Wilson frame. The patient's lumbosacral region was then prepared with Betadine scrub and Betadine solution. Sterile drapes were applied.  I then injected the area to be incised with Marcaine with epinephrine solution. I then used a scalpel to make a linear midline incision over the L4-5 intervertebral disc space. I then used electrocautery to perform a lateral subperiosteal dissection exposing the spinous process of L5 and the remainder of the lamina of L4-5. We obtained intraoperative radiograph to confirm our location. I then inserted the [facility] retractor for exposure.  We then brought the operative microscope into the field. Under its magnification and illumination we completed the microdissection. I used a high-speed drill to widen the patient's prior L4 laminectomy. I then used a Kerrison punches to widen the laminotomy and removed the  ligamentum flavum at L4-5. We also remove the cephalad aspect of the L5 lamina. There is severe stenosis as expected at L4-5.Marland Kitchen We then used microdissection to free up the thecal sac and the bilateral L4 and L5 nerve root from the epidural tissue. I then used a Kerrison punch to perform a foraminotomy at about the bilateral L4 and L5 nerve root. We inspected the intervertebral disc at L4-5. There were no significant herniations. We did not perform a discectomy.  I then palpated along the ventral surface of the thecal sac and along exit route of the bilateral L4 and L5 nerve root and noted that the neural structures were well decompressed. This completed the decompression.  We then obtained hemostasis using bipolar electrocautery. We irrigated the wound out with bacitracin solution. We then removed the retractor. We then reapproximated the patient's thoracolumbar fascia with interrupted #1 Vicryl suture. We then reapproximated the patient's subcutaneous tissue with interrupted 2-0 Vicryl suture. We then reapproximated patient's skin with Steri-Strips and benzoin. The was then coated with bacitracin ointment. The drapes were removed. The patient was subsequently returned to the supine position where they were extubated by the anesthesia team. The patient was then transported to the postanesthesia care unit in stable condition. All sponge instrument and needle counts were reportedly correct at the end of this case.

## 2016-08-13 ENCOUNTER — Encounter (HOSPITAL_COMMUNITY): Payer: Self-pay | Admitting: Neurosurgery

## 2016-08-13 DIAGNOSIS — M48062 Spinal stenosis, lumbar region with neurogenic claudication: Secondary | ICD-10-CM | POA: Diagnosis not present

## 2016-08-13 MED ORDER — HYDROCODONE-ACETAMINOPHEN 5-325 MG PO TABS: 1.0000 | ORAL_TABLET | ORAL | 0 refills | Status: DC | PRN

## 2016-08-13 NOTE — Discharge Summary (Signed)
Physician Discharge Summary  Patient ID: Leroy [name] MRN: [id number] DOB/AGE: 05-25-1930 82 y.o.  Admit date: 08/12/2016 Discharge date: 08/13/2016  Admission Diagnoses:L4-5 spondylolisthesis, spinal stenosis, lumbago, lumbar radiculopathy  Discharge Diagnoses: The same Active Problems:   Lumbar stenosis with neurogenic claudication   Discharged Condition: good  Hospital Course: I performed an L4-5 laminectomy on the patient on 08/12/2016. The surgery went well.  The patient's postoperative course was unremarkable. On postoperative day #1 the patient requested discharge home. The patient, and his wife, were given written and oral discharge instructions. All their questions were answered.  Consults: Physical therapy Significant Diagnostic Studies: None Treatments: L4-5 laminectomy Discharge Exam: Blood pressure 126/71, pulse 86, temperature 99.1 F (37.3 C), temperature source Oral, resp. rate 18, height 6\' 1"  (1.854 m), weight 102 kg (224 lb 14.4 oz), SpO2 98 %. The patient is alert and pleasant. His dressing is clean and dry. He is moving his lower extremities well. He looks and feels well.  Disposition: Home  Discharge Instructions    Call MD for:  difficulty breathing, headache or visual disturbances    Complete by:  As directed    Call MD for:  extreme fatigue    Complete by:  As directed    Call MD for:  hives    Complete by:  As directed    Call MD for:  persistant dizziness or light-headedness    Complete by:  As directed    Call MD for:  persistant nausea and vomiting    Complete by:  As directed    Call MD for:  redness, tenderness, or signs of infection (pain, swelling, redness, odor or green/yellow discharge around incision site)    Complete by:  As directed    Call MD for:  severe uncontrolled pain    Complete by:  As directed    Call MD for:  temperature >100.4    Complete by:  As directed    Diet - low sodium heart healthy    Complete by:  As  directed    Discharge instructions    Complete by:  As directed    Call [phone] for a followup appointment. Take a stool softener while you are using pain medications.   Driving Restrictions    Complete by:  As directed    Do not drive for 2 weeks.   Increase activity slowly    Complete by:  As directed    Lifting restrictions    Complete by:  As directed    Do not lift more than 5 pounds. No excessive bending or twisting.   May shower / Bathe    Complete by:  As directed    He may shower after the pain she is removed 3 days after surgery. Leave the incision alone.   Remove dressing in 48 hours    Complete by:  As directed    Your stitches are under the scan and will dissolve by themselves. The Steri-Strips will fall off after you take a few showers. Do not rub back or pick at the wound, Leave the wound alone.       Medication List    STOP taking these medications   predniSONE 10 MG (21) Tbpk tablet Commonly known as:  STERAPRED UNI-PAK 21 TAB     TAKE these medications   amLODipine 5 MG tablet Commonly known as:  NORVASC Take 1 tablet (5 mg total) by mouth daily.   atorvastatin 40 MG tablet Commonly known as:  LIPITOR  Take 1 tablet (40 mg total) by mouth daily.   cefUROXime 250 MG tablet Commonly known as:  CEFTIN Take 250 mg by mouth 2 (two) times daily with a meal.   cyclobenzaprine 5 MG tablet Commonly known as:  FLEXERIL Take 1 tablet (5 mg total) by mouth 3 (three) times daily as needed for muscle spasms.   HYDROcodone-acetaminophen 5-325 MG tablet Commonly known as:  NORCO/VICODIN Take 1-2 tablets by mouth every 4 (four) hours as needed for moderate pain.   losartan-hydrochlorothiazide 100-25 MG tablet Commonly known as:  HYZAAR Take 1 tablet by mouth daily.        SignedOphelia Charter 08/13/2016, 7:51 AM

## 2016-08-13 NOTE — Anesthesia Postprocedure Evaluation (Signed)
Anesthesia Post Note  Patient: Leroy [name]  Procedure(s) Performed: Procedure(s) (LRB): LAMINECTOMY AND FORAMINOTOMY LUMBAR FOUR - LUMBAR FIVE (N/A)  Patient location during evaluation: PACU Anesthesia Type: General Level of consciousness: awake and alert Pain management: pain level controlled Vital Signs Assessment: post-procedure vital signs reviewed and stable Respiratory status: spontaneous breathing, nonlabored ventilation, respiratory function stable and patient connected [name] nasal cannula oxygen Cardiovascular status: blood pressure returned [name] baseline and stable Postop Assessment: no signs of nausea or vomiting Anesthetic complications: no    Last Vitals:  Vitals:   08/13/16 0400 08/13/16 0809  BP: 126/71 (!) 107/57  Pulse: 86 72  Resp: 18 18  Temp: 37.3 C 36.9 C    Last Pain:  Vitals:   08/13/16 0400  TempSrc: Oral  PainSc:                  [name] DAVID

## 2016-08-13 NOTE — Evaluation (Signed)
Physical Therapy Evaluation/ Discharge Patient Details Name: Leroy [name] MRN: [id number] DOB: 1929-12-13 Today's Date: 08/13/2016   History of Present Illness  82 yo admitted for L4-5 lami. PMHx: skin CA, HTN, HLD, back sx, ICH  Clinical Impression  Pt very pleasant and actively farming PTA. Pt reports back and leg pain would limit activity to 15 min before having to sit. Pt educated for all precautions with handout provided and son-in-law present. Pt also educated for standing hip extension and abduction to increase strength and ambulatory stamina. All education completed and pt safe for D/C.     Follow Up Recommendations No PT follow up    Equipment Recommendations  None recommended by PT    Recommendations for Other Services       Precautions / Restrictions Precautions Precautions: Back Precaution Booklet Issued: Yes (comment)      Mobility  Bed Mobility Overal bed mobility: Needs Assistance Bed Mobility: Sidelying to Sit;Sit to Sidelying   Sidelying to sit: Supervision     Sit to sidelying: Supervision General bed mobility comments: cues for sequence and to prevent twisting  Transfers Overall transfer level: Needs assistance   Transfers: Sit to/from Stand Sit to Stand: Supervision         General transfer comment: cues for hand placement and posture  Ambulation/Gait Ambulation/Gait assistance: Modified independent (Device/Increase time) Ambulation Distance (Feet): 400 Feet Assistive device: None Gait Pattern/deviations: Step-through pattern;Decreased stride length   Gait velocity interpretation: Below normal speed for age/gender General Gait Details: pt with decreased RLE dorsiflexion with increased distance. Pt reports bil hip pain/weakness with increased distance.   Stairs Stairs: Yes Stairs assistance: Modified independent (Device/Increase time) Stair Management: One rail Right;Step to pattern;Forwards Number of Stairs: 4 General stair  comments: cues for sequence  Wheelchair Mobility    Modified Rankin (Stroke Patients Only)       Balance                                             Pertinent Vitals/Pain Pain Assessment: No/denies pain    Home Living Family/patient expects to be discharged to:: Private residence Living Arrangements: Spouse/significant other Available Help at Discharge: Family;Available 24 hours/day Type of Home: House Home Access: Stairs to enter Entrance Stairs-Rails: Right Entrance Stairs-Number of Steps: 3 Home Layout: Laundry or work area in basement;One level Home Equipment: None 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    Prior Function Level of Independence: Independent               Hand Dominance        Extremity/Trunk Assessment   Upper Extremity Assessment: Overall WFL for tasks assessed           Lower Extremity Assessment: Overall WFL for tasks assessed      Cervical / Trunk Assessment: Other exceptions  Communication   Communication: No difficulties  Cognition Arousal/Alertness: Awake/alert Behavior During Therapy: WFL for tasks assessed/performed Overall Cognitive Status: Within Functional Limits for tasks assessed                      General Comments      Exercises     Assessment/Plan    PT Assessment Patent does not need any further PT services  PT Problem List            PT Treatment Interventions  PT Goals (Current goals can be found in the Care Plan section)  Acute Rehab PT Goals PT Goal Formulation: All assessment and education complete, DC therapy    Frequency     Barriers to discharge        Co-evaluation               End of Session   Activity Tolerance: Patient tolerated treatment well Patient left: Other (comment) (in hall with son in law for D/C) Nurse Communication: Mobility status    Functional Assessment Tool Used: clinical judgement Functional  Limitation: Mobility: Walking and moving around Mobility: Walking and Moving Around Current Status [phone]): At least 1 percent but less than 20 percent impaired, limited or restricted Mobility: Walking and Moving Around Goal Status [phone]): At least 1 percent but less than 20 percent impaired, limited or restricted Mobility: Walking and Moving Around Discharge Status [phone]): At least 1 percent but less than 20 percent impaired, limited or restricted    Time: 0822-0834 PT Time Calculation (min) (ACUTE ONLY): 12 min   Charges:   PT Evaluation $PT Eval Low Complexity: 1 Procedure     PT G Codes:   PT G-Codes **NOT FOR INPATIENT CLASS** Functional Assessment Tool Used: clinical judgement Functional Limitation: Mobility: Walking and moving around Mobility: Walking and Moving Around Current Status [phone]): At least 1 percent but less than 20 percent impaired, limited or restricted Mobility: Walking and Moving Around Goal Status [phone]): At least 1 percent but less than 20 percent impaired, limited or restricted Mobility: Walking and Moving Around Discharge Status [phone]): At least 1 percent but less than 20 percent impaired, limited or restricted    Melford Aase 08/13/2016, 8:45 AM Elwyn Reach, [place]

## 2016-08-13 NOTE — Progress Notes (Signed)
Patient alert and oriented, mae's well, voiding adequate amount of urine, swallowing without difficulty, c/o moderate pain and meds given prior to discharged for ride and discomfort. Patient discharged home with family. Script and discharged instructions given to patient. Patient and family stated understanding of instructions given.   

## 2016-10-12 DIAGNOSIS — N1832 Chronic kidney disease, stage 3b: Secondary | ICD-10-CM | POA: Insufficient documentation

## 2016-10-12 DIAGNOSIS — E538 Deficiency of other specified B group vitamins: Secondary | ICD-10-CM | POA: Insufficient documentation

## 2017-02-22 DIAGNOSIS — M7581 Other shoulder lesions, right shoulder: Secondary | ICD-10-CM | POA: Insufficient documentation

## 2017-02-22 DIAGNOSIS — M7551 Bursitis of right shoulder: Secondary | ICD-10-CM | POA: Insufficient documentation

## 2017-04-20 ENCOUNTER — Emergency Department: Payer: Medicare Other

## 2017-04-20 ENCOUNTER — Other Ambulatory Visit
Admission: RE | Admit: 2017-04-20 | Discharge: 2017-04-20 | Disposition: A | Discharge status: Home / Self Care | Payer: Medicare Other | Source: Ambulatory Visit | Attending: Nurse Practitioner | Admitting: Nurse Practitioner

## 2017-04-20 ENCOUNTER — Encounter: Payer: Self-pay | Admitting: Emergency Medicine

## 2017-04-20 ENCOUNTER — Observation Stay
Admission: EM | Admit: 2017-04-20 | Discharge: 2017-04-21 | Disposition: A | Discharge status: Home / Self Care | Payer: Medicare Other | Attending: Internal Medicine | Admitting: Internal Medicine

## 2017-04-20 DIAGNOSIS — R197 Diarrhea, unspecified: Secondary | ICD-10-CM | POA: Diagnosis present

## 2017-04-20 DIAGNOSIS — N179 Acute kidney failure, unspecified: Secondary | ICD-10-CM | POA: Diagnosis not present

## 2017-04-20 DIAGNOSIS — I69354 Hemiplegia and hemiparesis following cerebral infarction affecting left non-dominant side: Secondary | ICD-10-CM | POA: Diagnosis not present

## 2017-04-20 DIAGNOSIS — E785 Hyperlipidemia, unspecified: Secondary | ICD-10-CM | POA: Insufficient documentation

## 2017-04-20 DIAGNOSIS — A0839 Other viral enteritis: Secondary | ICD-10-CM | POA: Diagnosis not present

## 2017-04-20 DIAGNOSIS — I69353 Hemiplegia and hemiparesis following cerebral infarction affecting right non-dominant side: Secondary | ICD-10-CM | POA: Diagnosis not present

## 2017-04-20 DIAGNOSIS — K259 Gastric ulcer, unspecified as acute or chronic, without hemorrhage or perforation: Secondary | ICD-10-CM | POA: Diagnosis not present

## 2017-04-20 DIAGNOSIS — R799 Abnormal finding of blood chemistry, unspecified: Secondary | ICD-10-CM | POA: Diagnosis present

## 2017-04-20 DIAGNOSIS — A09 Infectious gastroenteritis and colitis, unspecified: Secondary | ICD-10-CM

## 2017-04-20 DIAGNOSIS — I10 Essential (primary) hypertension: Secondary | ICD-10-CM | POA: Diagnosis not present

## 2017-04-20 DIAGNOSIS — E860 Dehydration: Secondary | ICD-10-CM

## 2017-04-20 LAB — GASTROINTESTINAL PANEL BY PCR, STOOL (REPLACES STOOL CULTURE)

## 2017-04-20 LAB — URINALYSIS, COMPLETE (UACMP) WITH MICROSCOPIC
BILIRUBIN URINE: NEGATIVE
GLUCOSE, UA: NEGATIVE mg/dL
KETONES UR: NEGATIVE mg/dL
Leukocytes, UA: NEGATIVE
NITRITE: NEGATIVE
PH: 5 (ref 5.0–8.0)
PROTEIN: NEGATIVE mg/dL
SQUAMOUS EPITHELIAL / LPF: NONE SEEN
Specific Gravity, Urine: 1.015 (ref 1.005–1.030)

## 2017-04-20 LAB — COMPREHENSIVE METABOLIC PANEL
ALBUMIN: 4 g/dL (ref 3.5–5.0)
ALK PHOS: 75 U/L (ref 38–126)
ALT: 18 U/L (ref 17–63)
ANION GAP: 11 (ref 5–15)
AST: 26 U/L (ref 15–41)
BUN: 37 mg/dL — ABNORMAL HIGH (ref 6–20)
CALCIUM: 8.8 mg/dL — AB (ref 8.9–10.3)
CO2: 19 mmol/L — AB (ref 22–32)
Chloride: 105 mmol/L (ref 101–111)
Creatinine, Ser: 2.23 mg/dL — ABNORMAL HIGH (ref 0.61–1.24)
GFR calc non Af Amer: 25 mL/min — ABNORMAL LOW (ref >60)
GFR, EST AFRICAN AMERICAN: 29 mL/min — AB (ref >60)
GLUCOSE: 106 mg/dL — AB (ref 65–99)
POTASSIUM: 3.8 mmol/L (ref 3.5–5.1)
SODIUM: 135 mmol/L (ref 135–145)
TOTAL PROTEIN: 7.4 g/dL (ref 6.5–8.1)
Total Bilirubin: 0.7 mg/dL (ref 0.3–1.2)

## 2017-04-20 LAB — CBC
HEMATOCRIT: 36.3 % — AB (ref 40.0–52.0)
Hemoglobin: 12.6 g/dL — ABNORMAL LOW (ref 13.0–18.0)
MCH: 30.8 pg (ref 26.0–34.0)
MCHC: 34.7 g/dL (ref 32.0–36.0)
MCV: 88.6 fL (ref 80.0–100.0)
Platelets: 244 10*3/uL (ref 150–440)
RBC: 4.09 MIL/uL — AB (ref 4.40–5.90)
RDW: 13.9 % (ref 11.5–14.5)
WBC: 7.9 10*3/uL (ref 3.8–10.6)

## 2017-04-20 LAB — BASIC METABOLIC PANEL
Anion gap: 7 (ref 5–15)
BUN: 37 mg/dL — AB (ref 6–20)
CALCIUM: 8.4 mg/dL — AB (ref 8.9–10.3)
CO2: 21 mmol/L — AB (ref 22–32)
CREATININE: 2.26 mg/dL — AB (ref 0.61–1.24)
Chloride: 107 mmol/L (ref 101–111)
GFR calc Af Amer: 29 mL/min — ABNORMAL LOW (ref >60)
GFR, EST NON AFRICAN AMERICAN: 25 mL/min — AB (ref >60)
GLUCOSE: 89 mg/dL (ref 65–99)
Potassium: 3.6 mmol/L (ref 3.5–5.1)
Sodium: 135 mmol/L (ref 135–145)

## 2017-04-20 LAB — C DIFFICILE QUICK SCREEN W PCR REFLEX
C Diff antigen: NEGATIVE
C Diff interpretation: NOT DETECTED
C Diff toxin: NEGATIVE

## 2017-04-20 LAB — LIPASE, BLOOD: Lipase: 33 U/L (ref 11–51)

## 2017-04-20 MED ORDER — SODIUM CHLORIDE 0.9 % IV SOLN
Freq: Once | INTRAVENOUS | Status: AC
  Administered 2017-04-20: 16:00:00 via INTRAVENOUS

## 2017-04-20 MED ORDER — LOPERAMIDE HCL 2 MG PO CAPS
4.0000 mg | ORAL_CAPSULE | Freq: Once | ORAL | Status: AC
Start: 2017-04-20 — End: 2017-04-20
  Administered 2017-04-20: 4 mg via ORAL
  Filled 2017-04-20: qty 2

## 2017-04-20 MED ORDER — PANTOPRAZOLE SODIUM 40 MG PO TBEC
40.0000 mg | DELAYED_RELEASE_TABLET | Freq: Two times a day (BID) | ORAL | Status: DC
Start: 2017-04-20 — End: 2017-04-21
  Administered 2017-04-20 – 2017-04-21 (×2): 40 mg via ORAL
  Filled 2017-04-20 (×2): qty 1

## 2017-04-20 MED ORDER — SODIUM CHLORIDE 0.9 % IV SOLN
INTRAVENOUS | Status: DC
  Administered 2017-04-20: 22:00:00 via INTRAVENOUS

## 2017-04-20 MED ORDER — ONDANSETRON HCL 4 MG/2ML IJ SOLN: 4.0000 mg | Freq: Four times a day (QID) | INTRAMUSCULAR | Status: DC | PRN

## 2017-04-20 MED ORDER — SODIUM CHLORIDE 0.9 % IV SOLN
Freq: Once | INTRAVENOUS | Status: AC
  Administered 2017-04-20: 15:00:00 via INTRAVENOUS

## 2017-04-20 MED ORDER — ENOXAPARIN SODIUM 40 MG/0.4ML ~~LOC~~ SOLN
30.0000 mg | SUBCUTANEOUS | Status: DC
  Administered 2017-04-20: 30 mg via SUBCUTANEOUS
  Filled 2017-04-20: qty 0.4

## 2017-04-20 MED ORDER — ACETAMINOPHEN 325 MG PO TABS: 650.0000 mg | ORAL_TABLET | Freq: Four times a day (QID) | ORAL | Status: DC | PRN

## 2017-04-20 MED ORDER — ONDANSETRON HCL 4 MG PO TABS: 4.0000 mg | ORAL_TABLET | Freq: Four times a day (QID) | ORAL | Status: DC | PRN

## 2017-04-20 MED ORDER — ATORVASTATIN CALCIUM 20 MG PO TABS
40.0000 mg | ORAL_TABLET | Freq: Every day | ORAL | Status: DC
  Administered 2017-04-20 – 2017-04-21 (×2): 40 mg via ORAL
  Filled 2017-04-20 (×2): qty 2

## 2017-04-20 MED ORDER — ACETAMINOPHEN 650 MG RE SUPP: 650.0000 mg | Freq: Four times a day (QID) | RECTAL | Status: DC | PRN

## 2017-04-20 NOTE — H&P (Signed)
[place] [facility] [place] NAME: Leroy [name]    MR#:  [id number]  DATE OF BIRTH:  1930/03/16  DATE OF ADMISSION:  04/20/2017  PRIMARY CARE PHYSICIAN: Idelle Crouch, MD   REQUESTING/REFERRING PHYSICIAN: Dr. Jimmye Norman  CHIEF COMPLAINT:   Chief Complaint  Patient presents with  . Abnormal Labs    HISTORY OF PRESENT ILLNESS:  Leroy [name]  is a 83 y.o. male with a known history of Hypertension, gastric ulcers presents to the hospital complaining of 5 days of diarrhea. He was seen by his primary care physician and blood work was checked. Creatinine elevated to 2.3 from his baseline of 1.4 and sent into the emergency room. Here patient continues to have watery diarrhea. Stool PCR has been checked and shows sapovirus. He has no fever or abdominal pain. No other sick contacts.   PAST MEDICAL HISTORY:   Past Medical History:  Diagnosis Date  . Cancer ([place])    skin cancer face  . Hyperlipidemia   . Hypertension   . Stomach ulcer   . Stroke [facility])    feb 2017, weakness in legs,     PAST SURGICAL HISTORY:   Past Surgical History:  Procedure Laterality Date  . BACK SURGERY    . EYE SURGERY     cataracts  . HERNIA REPAIR    . LUMBAR LAMINECTOMY/DECOMPRESSION MICRODISCECTOMY N/A 08/12/2016   Procedure: LAMINECTOMY AND FORAMINOTOMY LUMBAR FOUR - LUMBAR FIVE;  Surgeon: Newman Pies, MD;  Location: [place];  Service: Neurosurgery;  Laterality: N/A;  LAMINECTOMY AND FORAMINOTOMY L4-L5    SOCIAL HISTORY:   Social History  Substance Use Topics  . Smoking status: Never Smoker  . Smokeless tobacco: Current User    Types: Chew  . Alcohol use No    FAMILY HISTORY:   Family History  Problem Relation Age of Onset  . CAD Mother   . Cancer Father     DRUG ALLERGIES:   Allergies  Allergen Reactions  . Aspirin Other (See Comments)    He has bleeding ulcers  . Other Other (See Comments)    UNSPECIFIED REACTION  Doesn't like to take  pain medication     REVIEW OF SYSTEMS:   Review of Systems  Constitutional: Positive for malaise/fatigue. Negative for chills, fever and weight loss.  HENT: Negative for hearing loss and nosebleeds.   Eyes: Negative for blurred vision, double vision and pain.  Respiratory: Negative for cough, hemoptysis, sputum production, shortness of breath and wheezing.   Cardiovascular: Negative for chest pain, palpitations, orthopnea and leg swelling.  Gastrointestinal: Positive for diarrhea. Negative for abdominal pain, constipation, nausea and vomiting.  Genitourinary: Negative for dysuria and hematuria.  Musculoskeletal: Negative for back pain, falls and myalgias.  Skin: Negative for rash.  Neurological: Positive for weakness. Negative for dizziness, tremors, sensory change, speech change, focal weakness, seizures and headaches.  Endo/Heme/Allergies: Does not bruise/bleed easily.  Psychiatric/Behavioral: Negative for depression and memory loss. The patient is not nervous/anxious.     MEDICATIONS AT HOME:   Prior to Admission medications   Medication Sig Start Date End Date Taking? Authorizing Provider  atorvastatin (LIPITOR) 40 MG tablet Take 1 tablet (40 mg total) by mouth daily. 12/14/15  Yes Rinehuls, Early Chars, PA-C  losartan-hydrochlorothiazide (HYZAAR) 50-12.5 MG tablet Take 1 tablet by mouth daily. 03/18/17  Yes [provider]  pantoprazole (PROTONIX) 40 MG tablet Take 40 mg by mouth 2 (two) times daily.  04/19/17  Yes [provider]  amLODipine (NORVASC) 5 MG tablet Take 1 tablet (5 mg total) by mouth daily. Patient not taking: Reported on 07/30/2016 12/14/15   Rinehuls, Early Chars, PA-C  cyclobenzaprine (FLEXERIL) 5 MG tablet Take 1 tablet (5 mg total) by mouth 3 (three) times daily as needed for muscle spasms. Patient not taking: Reported on 07/30/2016 05/24/16   Hagler, Jami L, PA-C  HYDROcodone-acetaminophen (NORCO/VICODIN) 5-325 MG tablet Take 1-2 tablets by mouth every 4  (four) hours as needed for moderate pain. Patient not taking: Reported on 04/20/2017 08/13/16   Newman Pies, MD     VITAL SIGNS:  Blood pressure 118/74, pulse 65, temperature 97.7 F (36.5 C), temperature source Oral, resp. rate 18, height 6\' 1"  (1.854 m), weight 98 kg (216 lb), SpO2 100 %.  PHYSICAL EXAMINATION:  Physical Exam  GENERAL:  83 y.o.-year-old patient lying in the bed with no acute distress.  EYES: Pupils equal, round, reactive to light and accommodation. No scleral icterus. Extraocular muscles intact.  HEENT: Head atraumatic, normocephalic. Oropharynx and nasopharynx clear. No oropharyngeal erythema, dry oral mucosa  NECK:  Supple, no jugular venous distention. No thyroid enlargement, no tenderness.  LUNGS: Normal breath sounds bilaterally, no wheezing, rales, rhonchi. No use of accessory muscles of respiration.  CARDIOVASCULAR: S1, S2 normal. No murmurs, rubs, or gallops.  ABDOMEN: Soft, nontender, nondistended. Bowel sounds present. No organomegaly or mass.  EXTREMITIES: No pedal edema, cyanosis, or clubbing. + 2 pedal & radial pulses b/l.   NEUROLOGIC: Cranial nerves II through XII are intact. No focal Motor or sensory deficits appreciated b/l PSYCHIATRIC: The patient is alert and oriented x 3. Good affect.  SKIN: No obvious rash, lesion, or ulcer.   LABORATORY PANEL:   CBC  Recent Labs Lab 04/20/17 1406  WBC 7.9  HGB 12.6*  HCT 36.3*  PLT 244   ------------------------------------------------------------------------------------------------------------------  Chemistries   Recent Labs Lab 04/20/17 1406 04/20/17 1548  NA 135 135  K 3.8 3.6  CL 105 107  CO2 19* 21*  GLUCOSE 106* 89  BUN 37* 37*  CREATININE 2.23* 2.26*  CALCIUM 8.8* 8.4*  AST 26  --   ALT 18  --   ALKPHOS 75  --   BILITOT 0.7  --    ------------------------------------------------------------------------------------------------------------------  Cardiac Enzymes No results  for input(s): TROPONINI in the last 168 hours. ------------------------------------------------------------------------------------------------------------------  RADIOLOGY:  No results found.   IMPRESSION AND PLAN:   * Acute diarrhea due to Sapovirus associated with acute kidney injury. Patient continues to have diarrhea in the emergency room. We'll start him on IV fluids. Enteric precautions. No antibiotics. Monitor input and output. Repeat labs in the morning. Discharge home when diarrhea improved and creatinine is improved.  * Acute kidney injury. See above  * HTN Hold meds  * DVT prophylaxis Lovenox  All the records are reviewed and case discussed with ED provider. Management plans discussed with the patient, family and they are in agreement.  CODE STATUS: FULL CODE  TOTAL TIME TAKING CARE OF THIS PATIENT: 40 minutes.   Hillary Bow R M.D on 04/20/2017 at 5:07 PM  Between 7am to 6pm - Pager - [phone]  After 6pm go to [email or web address] - password [email or web address] [facility]  Office  [phone]  CC: Primary care physician; Idelle Crouch, MD  Note: This dictation was prepared with Dragon dictation along with smaller phrase technology. Any transcriptional errors that result from this process are unintentional.

## 2017-04-20 NOTE — ED Notes (Signed)
Attempted to call report at this time, per secretary receiving RN is unable to take report and will call back

## 2017-04-20 NOTE — ED Notes (Signed)
Pt went to [facility] for diarrhea since last Friday (5-6 loose stools in 24 hours) - pt reports that stool started out dark but today the stool is brown in color and semi-formed - Yesterday pt went to [facility] and was started on Pantoprazole and Diphenoxylal started Saturday because he had at home - [facility] called pt back today and they went to give a stool sample and they were told to come to the er for abnormal labs

## 2017-04-20 NOTE — ED Notes (Signed)
Patient did not have to use the restroom at this time but was given a specimen cup to collect a sample when he has to go.

## 2017-04-20 NOTE — ED Provider Notes (Addendum)
[facility] Emergency Department Provider Note       Time seen: ----------------------------------------- 2:48 PM on 04/20/2017 -----------------------------------------     I have reviewed the triage vital signs and the nursing notes.   HISTORY   Chief Complaint Abnormal Labs    HPI Leroy [name] is a 83 y.o. male who presents to the ED for abnormal lab work coming from [facility]. Patient has had a diarrheal illness for the last 5 days, went to see his doctor who ordered outpatient lab work and sent him to the ER for evaluation when his labs revealed renal insufficiency. Patient denies any fevers or chills, denies any vomiting or specific abdominal pain.   Past Medical History:  Diagnosis Date  . Cancer ([place])    skin cancer face  . Hyperlipidemia   . Hypertension   . Stomach ulcer   . Stroke [facility])    feb 2017, weakness in legs,     Patient Active Problem List   Diagnosis Date Noted  . Lumbar stenosis with neurogenic claudication 08/12/2016  . Hypertensive emergency 12/13/2015  . 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 Acute intracerebral hemorrhage ([place]) 12/12/2015    Past Surgical History:  Procedure Laterality Date  . BACK SURGERY    . EYE SURGERY     cataracts  . HERNIA REPAIR    . LUMBAR LAMINECTOMY/DECOMPRESSION MICRODISCECTOMY N/A 08/12/2016   Procedure: LAMINECTOMY AND FORAMINOTOMY LUMBAR FOUR - LUMBAR FIVE;  Surgeon: Newman Pies, MD;  Location: [place];  Service: Neurosurgery;  Laterality: N/A;  LAMINECTOMY AND FORAMINOTOMY L4-L5    Allergies Aspirin and Other  Social History Social History  Substance Use Topics  . Smoking status: Never Smoker  . Smokeless tobacco: Current User    Types: Chew  . Alcohol use No    Review of Systems Constitutional: Negative for fever. Cardiovascular: Negative for chest pain. Respiratory: Negative for shortness of breath. Gastrointestinal: Negative for  abdominal pain, Positive for diarrhea Genitourinary: Negative for dysuria. Musculoskeletal: Negative for back pain. Skin: Negative for rash. Neurological: Negative for headaches, focal weakness or numbness.  All systems negative/normal/unremarkable except as stated in the HPI  ____________________________________________   PHYSICAL EXAM:  VITAL SIGNS: ED Triage Vitals  Enc Vitals Group     BP 04/20/17 1402 120/64     Pulse Rate 04/20/17 1402 72     Resp 04/20/17 1402 18     Temp 04/20/17 1402 97.7 F (36.5 C)     Temp Source 04/20/17 1402 Oral     SpO2 04/20/17 1402 99 %     Weight 04/20/17 1402 216 lb (98 kg)     Height 04/20/17 1402 6\' 1"  (1.854 m)     Head Circumference --      Peak Flow --      Pain Score 04/20/17 1407 0     Pain Loc --      Pain Edu? --      Excl. in [place]? --    Constitutional: Alert and oriented. Well appearing and in no distress. Eyes: Conjunctivae are normal. Normal extraocular movements. ENT   Head: Normocephalic and atraumatic.   Nose: No congestion/rhinnorhea.   Mouth/Throat: Mucous membranes are moist.   Neck: No stridor. Cardiovascular: Normal rate, regular rhythm. No murmurs, rubs, or gallops. Respiratory: Normal respiratory effort without tachypnea nor retractions. Breath sounds are clear and equal bilaterally. No wheezes/rales/rhonchi. Gastrointestinal: Soft and nontender. Normal bowel sounds Musculoskeletal: Nontender with normal range of motion in extremities. No lower  extremity tenderness nor edema. Neurologic:  Normal speech and language. No gross focal neurologic deficits are appreciated.  Skin:  Skin is warm, dry and intact. No rash noted. Psychiatric: Mood and affect are normal. Speech and behavior are normal.  ___________________________________________  ED COURSE:  Pertinent labs & imaging results that were available during my care of the patient were reviewed by me and considered in my medical decision making (see  chart for details). Patient presents for diarrhea and possible dehydration, we will assess with labs and imaging as indicated.   Procedures ____________________________________________   LABS (pertinent positives/negatives)  Labs Reviewed  COMPREHENSIVE METABOLIC PANEL - Abnormal; Notable for the following:       Result Value   CO2 19 (*)    Glucose, Bld 106 (*)    BUN 37 (*)    Creatinine, Ser 2.23 (*)    Calcium 8.8 (*)    GFR calc non Af Amer 25 (*)    GFR calc Af Amer 29 (*)    All other components within normal limits  CBC - Abnormal; Notable for the following:    RBC 4.09 (*)    Hemoglobin 12.6 (*)    HCT 36.3 (*)    All other components within normal limits  BASIC METABOLIC PANEL - Abnormal; Notable for the following:    CO2 21 (*)    BUN 37 (*)    Creatinine, Ser 2.26 (*)    Calcium 8.4 (*)    GFR calc non Af Amer 25 (*)    GFR calc Af Amer 29 (*)    All other components within normal limits  LIPASE, BLOOD  URINALYSIS, COMPLETE (UACMP) WITH MICROSCOPIC   ___________________________________________  FINAL ASSESSMENT AND PLAN  Diarrhea, dehydration, acute renal insufficiency  Plan: Patient's labs and imaging were dictated above. Patient had presented for diarrhea and appears dehydrated with evidence of elevation in his serum creatinine. Outpatient labs do reveal worsening in his renal function and he has tested positive for a viral infection in his GI tract. Patient received 2 L of normal saline without significant improvement in his creatinine. I will discuss with the hospitalist for admission.   Earleen Newport, MD   Note: This note was generated in part or whole with voice recognition software. Voice recognition is usually quite accurate but there are transcription errors that can and very often do occur. I apologize for any typographical errors that were not detected and corrected.     Earleen Newport, MD 04/20/17 1452    Earleen Newport, MD 04/20/17 [phone]

## 2017-04-20 NOTE — ED Triage Notes (Signed)
Pt here for abnormal labs, Manito drew blood work today and pt's BUN and creatinine are elevated.

## 2017-04-21 DIAGNOSIS — A0839 Other viral enteritis: Secondary | ICD-10-CM | POA: Diagnosis not present

## 2017-04-21 LAB — BASIC METABOLIC PANEL
Anion gap: 5 (ref 5–15)
BUN: 31 mg/dL — AB (ref 6–20)
CO2: 20 mmol/L — ABNORMAL LOW (ref 22–32)
CREATININE: 1.57 mg/dL — AB (ref 0.61–1.24)
Calcium: 8.3 mg/dL — ABNORMAL LOW (ref 8.9–10.3)
Chloride: 112 mmol/L — ABNORMAL HIGH (ref 101–111)
GFR calc Af Amer: 45 mL/min — ABNORMAL LOW (ref >60)
GFR, EST NON AFRICAN AMERICAN: 39 mL/min — AB (ref >60)
GLUCOSE: 101 mg/dL — AB (ref 65–99)
POTASSIUM: 3.5 mmol/L (ref 3.5–5.1)
Sodium: 137 mmol/L (ref 135–145)

## 2017-04-21 MED ORDER — LOPERAMIDE HCL 2 MG PO TABS: 2.0000 mg | ORAL_TABLET | Freq: Four times a day (QID) | ORAL | 0 refills | Status: DC | PRN

## 2017-04-21 MED ORDER — LOPERAMIDE HCL 2 MG PO CAPS
2.0000 mg | ORAL_CAPSULE | Freq: Four times a day (QID) | ORAL | Status: DC | PRN
  Administered 2017-04-21: 2 mg via ORAL
  Filled 2017-04-21: qty 1

## 2017-04-21 NOTE — Care Management (Signed)
Admitted to [facility] with the diagnosis of diarrhea under observation status. Lives with wife, [name] [phone]). Last seen Dr. Doy Hutching 2 weeks ago. Home Health per [facility] following back surgery couple of years ago. [facility] x 3 weeks couple of years ago. No home oxygen. Rolling walker in the home, if needed. Takes care of all basic activities of daily living himself, drives. Prescriptions are filled at [facility].  Possible discharge today per Dr.Sudini Wife will transport Shelbie Ammons RN MSN CCM Care Management [phone]

## 2017-04-21 NOTE — Progress Notes (Signed)
Discharge paperwork reviewed with patient who verbalized understanding. New prescription for Imodium attached (instructed last dose 9:48am). No diarrhea since early this morning. Patient is stable and ready for discharge. Patient's wife at bedside to transport home.

## 2017-04-21 NOTE — Plan of Care (Signed)
Problem: Education: Goal: Knowledge of  General Education information/materials will improve Outcome: Progressing VS WDL, free of falls.  Denies pain.  Pt reports BM x3 during shift.  Dr. Jannifer [place] paged per pt's wife request, in ED pt was told IV fluids would be ordered.  Ordered NS @ 75 initiated.  No other needs overnight.  Wife at bedside, call bell within reach.  WCTM.

## 2017-04-21 NOTE — Care Management Obs Status (Signed)
[place] NOTIFICATION   Patient Details  Name: Leroy [name] MRN: [id number] Date of Birth: August 26, 1930   Medicare Observation Status Notification Given:  Yes    Shelbie Ammons, RN 04/21/2017, 9:22 AM

## 2017-04-21 NOTE — Discharge Instructions (Signed)
Soft bland diet for 1-2 days and then regular diet  Activity as tolerated

## 2017-04-27 NOTE — Discharge Summary (Signed)
Leroy [name] NAME: Leroy [name]    MR#:  [id number]  DATE OF BIRTH:  1930/08/12  DATE OF ADMISSION:  04/20/2017 ADMITTING PHYSICIAN: Hillary Bow, MD  DATE OF DISCHARGE: 04/21/2017 12:18 PM  PRIMARY CARE PHYSICIAN: Idelle Crouch, MD   ADMISSION DIAGNOSIS:  Dehydration [E86.0] Diarrhea of infectious origin [A09]  DISCHARGE DIAGNOSIS:  Active Problems:   Diarrhea   SECONDARY DIAGNOSIS:   Past Medical History:  Diagnosis Date  . Cancer ([place])    skin cancer face  . Hyperlipidemia   . Hypertension   . Stomach ulcer   . Stroke [facility])    feb 2017, weakness in legs,      ADMITTING HISTORY  HISTORY OF PRESENT ILLNESS:  Leroy [name]  is a 83 y.o. male with a known history of Hypertension, gastric ulcers presents to the hospital complaining of 5 days of diarrhea. He was seen by his primary care physician and blood work was checked. Creatinine elevated to 2.3 from his baseline of 1.4 and sent into the emergency room. Here patient continues to have watery diarrhea. Stool PCR has been checked and shows sapovirus. He has no fever or abdominal pain. No other sick contacts.   HOSPITAL COURSE:   * Acute diarrhea due to Sapovirus associated with acute kidney injury.  Patient started on Imodium as needed.  IV fluids.  With this his creatinine improved.  Good urine output.  Diarrhea improved by the day of discharge.  No abdominal pain.  Afebrile.  Patient is being discharged back home in stable condition.  * Hypertension Medications held in the hospital.  He'll be restarted on discharge.  CONSULTS OBTAINED:    DRUG ALLERGIES:   Allergies  Allergen Reactions  . Aspirin Other (See Comments)    He has bleeding ulcers  . Other Other (See Comments)    UNSPECIFIED REACTION  Doesn't like to take pain medication     DISCHARGE MEDICATIONS:   Discharge Medication List as of 04/21/2017 11:37 AM    START taking these medications    Details  loperamide (IMODIUM A-D) 2 MG tablet Take 1 tablet (2 mg total) by mouth 4 (four) times daily as needed for diarrhea or loose stools., Starting Wed 04/21/2017, Print      CONTINUE these medications which have NOT CHANGED   Details  atorvastatin (LIPITOR) 40 MG tablet Take 1 tablet (40 mg total) by mouth daily., Starting Sat 12/14/2015, Print    losartan-hydrochlorothiazide (HYZAAR) 50-12.5 MG tablet Take 1 tablet by mouth daily., Starting Thu 03/18/2017, Historical Med    pantoprazole (PROTONIX) 40 MG tablet Take 40 mg by mouth 2 (two) times daily. , Starting Mon 04/19/2017, Historical Med    amLODipine (NORVASC) 5 MG tablet Take 1 tablet (5 mg total) by mouth daily., Starting Sat 12/14/2015, Print    cyclobenzaprine (FLEXERIL) 5 MG tablet Take 1 tablet (5 mg total) by mouth 3 (three) times daily as needed for muscle spasms., Starting Sun 05/24/2016, Print    HYDROcodone-acetaminophen (NORCO/VICODIN) 5-325 MG tablet Take 1-2 tablets by mouth every 4 (four) hours as needed for moderate pain., Starting Thu 08/13/2016, Print        Today   VITAL SIGNS:  Blood pressure (!) 155/77, pulse 65, temperature 97 F (36.1 C), temperature source Oral, resp. rate 16, height 6\' 1"  (1.854 m), weight 99.3 kg (218 lb 14.4 oz), SpO2 98 %.  I/O:  No intake or output data in the 24 hours ending  04/27/17 1613  PHYSICAL EXAMINATION:  Physical Exam  GENERAL:  83 y.o.-year-old patient lying in the bed with no acute distress.  LUNGS: Normal breath sounds bilaterally, no wheezing, rales,rhonchi or crepitation. No use of accessory muscles of respiration.  CARDIOVASCULAR: S1, S2 normal. No murmurs, rubs, or gallops.  ABDOMEN: Soft, non-tender, non-distended. Bowel sounds present. No organomegaly or mass.  NEUROLOGIC: Moves all 4 extremities. PSYCHIATRIC: The patient is alert and oriented x 3.  SKIN: No obvious rash, lesion, or ulcer.   DATA REVIEW:   CBC No results for input(s): WBC, HGB, HCT,  PLT in the last 168 hours.  Chemistries   Recent Labs Lab 04/21/17 0545  NA 137  K 3.5  CL 112*  CO2 20*  GLUCOSE 101*  BUN 31*  CREATININE 1.57*  CALCIUM 8.3*    Cardiac Enzymes No results for input(s): TROPONINI in the last 168 hours.  Microbiology Results  Results for orders placed or performed during the hospital encounter of 04/20/17  C difficile quick scan w PCR reflex     Status: None   Collection Time: 04/20/17 10:15 AM  Result Value Ref Range Status   C Diff antigen NEGATIVE NEGATIVE Final   C Diff toxin NEGATIVE NEGATIVE Final   C Diff interpretation No C. difficile detected.  Final  Gastrointestinal Panel by PCR , Stool     Status: Abnormal   Collection Time: 04/20/17 10:15 AM  Result Value Ref Range Status   Campylobacter species NOT DETECTED NOT DETECTED Final   Plesimonas shigelloides NOT DETECTED NOT DETECTED Final   Salmonella species NOT DETECTED NOT DETECTED Final   Yersinia enterocolitica NOT DETECTED NOT DETECTED Final   Vibrio species NOT DETECTED NOT DETECTED Final   Vibrio cholerae NOT DETECTED NOT DETECTED Final   Enteroaggregative E coli (EAEC) NOT DETECTED NOT DETECTED Final   Enteropathogenic E coli (EPEC) NOT DETECTED NOT DETECTED Final   Enterotoxigenic E coli (ETEC) NOT DETECTED NOT DETECTED Final   Shiga like toxin producing E coli (STEC) NOT DETECTED NOT DETECTED Final   Shigella/Enteroinvasive E coli (EIEC) NOT DETECTED NOT DETECTED Final   Cryptosporidium NOT DETECTED NOT DETECTED Final   Cyclospora cayetanensis NOT DETECTED NOT DETECTED Final   Entamoeba histolytica NOT DETECTED NOT DETECTED Final   Giardia lamblia NOT DETECTED NOT DETECTED Final   Adenovirus F40/41 NOT DETECTED NOT DETECTED Final   Astrovirus NOT DETECTED NOT DETECTED Final   Norovirus GI/GII NOT DETECTED NOT DETECTED Final   Rotavirus A NOT DETECTED NOT DETECTED Final   Sapovirus (I, II, IV, and V) DETECTED (A) NOT DETECTED Final    RADIOLOGY:  No results  found.  Follow up with PCP in 1 week.  Management plans discussed with the patient, family and they are in agreement.  CODE STATUS:  Code Status History    Date Active Date Inactive Code Status Order ID Comments User Context   04/20/2017  5:07 PM 04/21/2017  3:45 PM Full Code [id number]  Hillary Bow, MD ED   08/12/2016  6:16 PM 08/13/2016  3:27 PM Full Code [id number]  Newman Pies, MD Inpatient   12/12/2015  9:44 PM 12/14/2015  5:56 PM Full Code [id number]  Maisie Fus, MD Inpatient      TOTAL TIME TAKING CARE OF THIS PATIENT ON DAY OF DISCHARGE: more than 30 minutes.   Hillary Bow R M.D on 04/27/2017 at 4:13 PM  Between 7am to 6pm - Pager - [phone]  After 6pm go to [email or web address] - password  [email or web address]  [facility] [facility]  Office  [phone]  CC: Primary care physician; Idelle Crouch, MD  Note: This dictation was prepared with Dragon dictation along with smaller phrase technology. Any transcriptional errors that result from this process are unintentional.

## 2017-07-13 DIAGNOSIS — G5601 Carpal tunnel syndrome, right upper limb: Secondary | ICD-10-CM | POA: Insufficient documentation

## 2017-08-27 ENCOUNTER — Other Ambulatory Visit: Payer: Self-pay | Admitting: Neurology

## 2017-08-27 DIAGNOSIS — R413 Other amnesia: Secondary | ICD-10-CM

## 2017-09-01 ENCOUNTER — Ambulatory Visit
Admission: RE | Admit: 2017-09-01 | Discharge: 2017-09-01 | Disposition: A | Discharge status: Home / Self Care | Payer: Medicare Other | Source: Ambulatory Visit | Attending: Neurology | Admitting: Neurology

## 2017-09-01 DIAGNOSIS — M5031 Other cervical disc degeneration,  high cervical region: Secondary | ICD-10-CM | POA: Insufficient documentation

## 2017-09-01 DIAGNOSIS — M4802 Spinal stenosis, cervical region: Secondary | ICD-10-CM | POA: Insufficient documentation

## 2017-09-01 DIAGNOSIS — I739 Peripheral vascular disease, unspecified: Secondary | ICD-10-CM | POA: Diagnosis not present

## 2017-09-01 DIAGNOSIS — R413 Other amnesia: Secondary | ICD-10-CM | POA: Diagnosis not present

## 2017-12-14 ENCOUNTER — Ambulatory Visit: Payer: Self-pay | Admitting: General Surgery

## 2017-12-14 NOTE — H&P (View-Only) (Signed)
PATIENT PROFILE: Leroy [name] is a 84 y.o. male who presents to the Clinic for consultation at the request of Dr. Doy Hutching for evaluation of right inguinal hernia.  PCP:  Idelle Crouch, MD  HISTORY OF PRESENT ILLNESS: Leroy [name] reports feeling a bulging on the right inguinal area since 3 months ago. The patient refers that he works on [employer] and is active. The patient refers that he feels the hernia and discomfort when he cough and strain. If he is resting he does not feels the hernia. The patient refers that he is able to reduce it easily. He also complain that is getting bigger with time and as he does more work. He is worried since he is starting to feel it more and having more discomfort.    PROBLEM LIST:         Problem List  Date Reviewed: 12/08/2017         Noted   Carpal tunnel syndrome, right 07/13/2017   Rotator cuff tendinitis, right 02/22/2017   Bursitis of shoulder, right 02/22/2017   B12 deficiency 10/12/2016   CKD (chronic kidney disease) stage 3, GFR 30-59 ml/min (CMS-HCC) 10/12/2016   Lumbar stenosis with neurogenic claudication 08/12/2016   SOB (shortness of breath) on exertion 07/13/2016   Preop cardiovascular exam 07/13/2016   IVCD (intraventricular conduction defect) 07/13/2016   Polyneuropathy 05/25/2016   Benign essential tremor 04/08/2016   Leg weakness, bilateral 04/08/2016   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 Positive  weight loss Allergy and Immunology ROS: negative for - hives  Hematological and Lymphatic ROS: negative for - bleeding problems or bruising, negative for palpable nodes Endocrine ROS: negative for - heat or cold intolerance, hair changes Respiratory ROS: negative for - cough, shortness of breath or wheezing Cardiovascular ROS: no chest pain or palpitations GI ROS: negative for nausea, vomiting, abdominal pain, diarrhea, constipation Musculoskeletal ROS: negative for - joint swelling or muscle pain Neurological ROS: negative for - confusion, syncope Dermatological ROS: negative for pruritus and rash Psychiatric: negative for anxiety, depression, difficulty sleeping. Positive memory loss  MEDICATIONS: CurrentMedications        Current Outpatient Medications  Medication Sig Dispense Refill  . atorvastatin (LIPITOR) 40 MG tablet TAKE ONE (1) TABLET BY MOUTH EVERY DAY 30 tablet 5  . cholecalciferol (CHOLECALCIFEROL) 1,000 unit tablet Take by mouth    . lamoTRIgine (LAMICTAL) 25 MG tablet     . losartan-hydrochlorothiazide (HYZAAR) 50-12.5 mg tablet Take 1 tablet by mouth once daily. 30 tablet 11   No current facility-administered medications for this visit.       ALLERGIES: Aspirin and Codeine  PAST MEDICAL HISTORY:     Past Medical History:  Diagnosis Date  . B12 deficiency   . Benign hypertension   . Cerebral hemorrhage (CMS-HCC)   . DDD (degenerative disc disease)   . Hemorrhagic stroke (CMS-HCC)   . Hiatal hernia   . Hyperlipidemia, unspecified   .  Neuropathy   . Obesity, unspecified   . Obesity, unspecified   . Tremor   . Upper GI bleed, unspecified    w/ peptic ulcer disease    PAST SURGICAL HISTORY:      Past Surgical History:  Procedure Laterality Date  . INGUINAL HERNIA REPAIR Left 1980s  . Kidney stones  1990s  . Lumbar laminectomy     Degenerative disc disease  .  Posterolateral fusion L3-4, posterolateral fusion L4-5, lumbar decompression     central and lateral partial facetectomy and foraminotomy L2-3, lumbar decompression central and lateral facetectomy and foraminotomy L3-4, lumbar decompression and partial facetectomy 06/29/07.   [facility] SPINE SURGERY       FAMILY HISTORY:      Family History  Problem Relation Age of Onset  . Myocardial Infarction (Heart attack) Mother   . Cancer Father      SOCIAL HISTORY: Social History        Socioeconomic History  . Marital status: Married    Spouse name: Not on file  . Number of children: Not on file  . Years of education: GED  . Highest education level: GED or equivalent  Occupational History  . Occupation: retired  Scientific laboratory technician  . Financial resource strain: Not on file  . Food insecurity:    Worry: Not on file    Inability: Not on file  . Transportation needs:    Medical: Not on file    Non-medical: Not on file  Tobacco Use  . Smoking status: Never Smoker  . Smokeless tobacco: Current User    Types: Chew  Substance and Sexual Activity  . Alcohol use: No    Alcohol/week: 0.0 oz  . Drug use: No  . Sexual activity: Yes  Lifestyle  . Physical activity:    Days per week: Not on file    Minutes per session: Not on file  . Stress: Not on file  Relationships  . Social connections:    Talks on phone: Not on file    Gets together: Not on file    Attends religious service: Not on file    Active member of club or organization: Not on file    Attends meetings of clubs or organizations: Not on file    Relationship status: Not on file  . Intimate partner violence:    Fear of current or ex partner: Not on file    Emotionally abused: Not on file    Physically abused: Not on file    Forced sexual activity: Not on file  Other Topics Concern  . Not on file  Social History Narrative  . Not on file    PHYSICAL EXAM:    Vitals:   12/14/17  0918  BP: 121/67  Pulse: 69  Temp: 36.6 C (97.8 F)   Body mass index is 29.16 kg/m. Weight: 100.2 kg (221 lb)   GENERAL: Alert, active, oriented x3  HEENT: Pupils equal reactive to light. Extraocular movements are intact. Sclera clear. Palpebral conjunctiva normal red color.Pharynx clear.  NECK: Supple with no palpable mass and no adenopathy.  LUNGS: Sound clear with no rales rhonchi or wheezes.  HEART: Regular rhythm S1 and S2 without murmur.  ABDOMEN: Soft and depressible, nontender with no palpable mass, no hepatomegaly. Reducible right inguinal hernia, soft, non tender.   EXTREMITIES: Well-developed well-nourished symmetrical with no dependent edema.  NEUROLOGICAL: Awake alert oriented, facial expression symmetrical, moving all extremities.  REVIEW OF DATA: I have reviewed the following data today:  Office Visit on 12/08/2017  Component Date Value  . Glucose 12/08/2017 87   . Sodium 12/08/2017 143   . Potassium 12/08/2017 3.6   . Chloride 12/08/2017 106   . Carbon Dioxide (CO2) 12/08/2017 27.4   . Urea Nitrogen (BUN) 12/08/2017 32*  . Creatinine 12/08/2017 1.7*  . Glomerular Filtration Ra* 12/08/2017 38*  . Calcium 12/08/2017 9.5   . AST  12/08/2017 23   . ALT  12/08/2017 15   . Alk Phos (alkaline Phosp* 12/08/2017 62   . Albumin 12/08/2017 4.1   . Bilirubin, Total 12/08/2017 0.6   . Protein, Total 12/08/2017 6.9   . A/G Ratio 12/08/2017 1.5   . WBC (White Blood Cell Co* 12/08/2017 8.0   . RBC (Red Blood Cell Coun* 12/08/2017 3.78*  . Hemoglobin 12/08/2017 11.7*  . Hematocrit 12/08/2017 35.0*  . MCV (Mean Corpuscular Vo* 12/08/2017 92.6   . MCH (Mean Corpuscular He* 12/08/2017 31.0   . MCHC (Mean Corpuscular H* 12/08/2017 33.4   . Platelet Count 12/08/2017 228   . RDW-CV (Red Cell Distrib* 12/08/2017 12.8   . MPV (Mean Platelet Volum* 12/08/2017 9.9   . Neutrophils 12/08/2017 4.51   . Lymphocytes 12/08/2017 2.54   . Monocytes 12/08/2017  0.71   . Eosinophils 12/08/2017 0.15   . Basophils 12/08/2017 0.04   . Neutrophil % 12/08/2017 56.5   . Lymphocyte % 12/08/2017 31.9   . Monocyte % 12/08/2017 8.9   . Eosinophil % 12/08/2017 1.9   . Basophil% 12/08/2017 0.5   . Immature Granulocyte % 12/08/2017 0.3   . Immature Granulocyte Cou* 12/08/2017 0.02      ASSESSMENT: Leroy [name] is a 84 y.o. male presenting for consultation for right inguinal hernia.    The patient presents with a symptomatic, reducible inguinal hernia. Patient was oriented about the diagnosis of inguinal hernia and its implication. The patient was oriented about the treatment alternatives (observation vs surgical repair). Due to patient symptoms and that it is increasing in size and bothering, repair is recommended. Patient oriented about the surgical procedure, the use of mesh and its risk of complications such as: infection, bleeding, injury to vas deference, vasculature and testicle, injury to bowel or bladder, and chronic pain.  PLAN: 1. Right inguinal hernia repair with mesh 2. Internal medicine clearance 3. CBC, CMP - done  Patient and his wife verbalized understanding, all questions were answered, and were agreeable with the plan outlined above.    Herbert Pun, MD  Electronically signed by Herbert Pun, MD

## 2017-12-14 NOTE — H&P (Signed)
PATIENT PROFILE: Leroy [name] is a 84 y.o. male who presents to the Clinic for consultation at the request of Dr. Doy [name] for evaluation of right inguinal hernia.  PCP:  Leroy Crouch, MD  HISTORY OF PRESENT ILLNESS: Mr. Leroy [name] reports feeling a bulging on the right inguinal area since 3 months ago. The patient refers that he works on [employer] and is active. The patient refers that he feels the hernia and discomfort when he cough and strain. If he is resting he does not feels the hernia. The patient refers that he is able to reduce it easily. He also complain that is getting bigger with time and as he does more work. He is worried since he is starting to feel it more and having more discomfort.    PROBLEM LIST:         Problem List  Date Reviewed: 12/08/2017         Noted   Carpal tunnel syndrome, right 07/13/2017   Rotator cuff tendinitis, right 02/22/2017   Bursitis of shoulder, right 02/22/2017   B12 deficiency 10/12/2016   CKD (chronic kidney disease) stage 3, GFR 30-59 ml/min (CMS-HCC) 10/12/2016   Lumbar stenosis with neurogenic claudication 08/12/2016   SOB (shortness of breath) on exertion 07/13/2016   Preop cardiovascular exam 07/13/2016   IVCD (intraventricular conduction defect) 07/13/2016   Polyneuropathy 05/25/2016   Benign essential tremor 04/08/2016   Leg weakness, bilateral 04/08/2016   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 Positive  weight loss Allergy and Immunology ROS: negative for - hives  Hematological and Lymphatic ROS: negative for - bleeding problems or bruising, negative for palpable nodes Endocrine ROS: negative for - heat or cold intolerance, hair changes Respiratory ROS: negative for - cough, shortness of breath or wheezing Cardiovascular ROS: no chest pain or palpitations GI ROS: negative for nausea, vomiting, abdominal pain, diarrhea, constipation Musculoskeletal ROS: negative for - joint swelling or muscle pain Neurological ROS: negative for - confusion, syncope Dermatological ROS: negative for pruritus and rash Psychiatric: negative for anxiety, depression, difficulty sleeping. Positive memory loss  MEDICATIONS: CurrentMedications        Current Outpatient Medications  Medication Sig Dispense Refill  . atorvastatin (LIPITOR) 40 MG tablet TAKE ONE (1) TABLET BY MOUTH EVERY DAY 30 tablet 5  . cholecalciferol (CHOLECALCIFEROL) 1,000 unit tablet Take by mouth    . lamoTRIgine (LAMICTAL) 25 MG tablet     . losartan-hydrochlorothiazide (HYZAAR) 50-12.5 mg tablet Take 1 tablet by mouth once daily. 30 tablet 11   No current facility-administered medications for this visit.       ALLERGIES: Aspirin and Codeine  PAST MEDICAL HISTORY:     Past Medical History:  Diagnosis Date  . B12 deficiency   . Benign hypertension   . Cerebral hemorrhage (CMS-HCC)   . DDD (degenerative disc disease)   . Hemorrhagic stroke (CMS-HCC)   . Hiatal hernia   . Hyperlipidemia, unspecified   .  Neuropathy   . Obesity, unspecified   . Obesity, unspecified   . Tremor   . Upper GI bleed, unspecified    w/ peptic ulcer disease    PAST SURGICAL HISTORY:      Past Surgical History:  Procedure Laterality Date  . INGUINAL HERNIA REPAIR Left 1980s  . Kidney stones  1990s  . Lumbar laminectomy     Degenerative disc disease  .  Posterolateral fusion L3-4, posterolateral fusion L4-5, lumbar decompression     central and lateral partial facetectomy and foraminotomy L2-3, lumbar decompression central and lateral facetectomy and foraminotomy L3-4, lumbar decompression and partial facetectomy 06/29/07.   [facility] Kitchen SPINE SURGERY       FAMILY HISTORY:      Family History  Problem Relation Age of Onset  . Myocardial Infarction (Heart attack) Mother   . Cancer Father      SOCIAL HISTORY: Social History        Socioeconomic History  . Marital status: Married    Spouse name: Not on file  . Number of children: Not on file  . Years of education: GED  . Highest education level: GED or equivalent  Occupational History  . Occupation: retired  Scientific laboratory technician  . Financial resource strain: Not on file  . Food insecurity:    Worry: Not on file    Inability: Not on file  . Transportation needs:    Medical: Not on file    Non-medical: Not on file  Tobacco Use  . Smoking status: Never Smoker  . Smokeless tobacco: Current User    Types: Chew  Substance and Sexual Activity  . Alcohol use: No    Alcohol/week: 0.0 oz  . Drug use: No  . Sexual activity: Yes  Lifestyle  . Physical activity:    Days per week: Not on file    Minutes per session: Not on file  . Stress: Not on file  Relationships  . Social connections:    Talks on phone: Not on file    Gets together: Not on file    Attends religious service: Not on file    Active member of club or organization: Not on file    Attends meetings of clubs or organizations: Not on file    Relationship status: Not on file  . Intimate partner violence:    Fear of current or ex partner: Not on file    Emotionally abused: Not on file    Physically abused: Not on file    Forced sexual activity: Not on file  Other Topics Concern  . Not on file  Social History Narrative  . Not on file    PHYSICAL EXAM:    Vitals:   12/14/17  0918  BP: 121/67  Pulse: 69  Temp: 36.6 C (97.8 F)   Body mass index is 29.16 kg/m. Weight: 100.2 kg (221 lb)   GENERAL: Alert, active, oriented x3  HEENT: Pupils equal reactive to light. Extraocular movements are intact. Sclera clear. Palpebral conjunctiva normal red color.Pharynx clear.  NECK: Supple with no palpable mass and no adenopathy.  LUNGS: Sound clear with no rales rhonchi or wheezes.  HEART: Regular rhythm S1 and S2 without murmur.  ABDOMEN: Soft and depressible, nontender with no palpable mass, no hepatomegaly. Reducible right inguinal hernia, soft, non tender.   EXTREMITIES: Well-developed well-nourished symmetrical with no dependent edema.  NEUROLOGICAL: Awake alert oriented, facial expression symmetrical, moving all extremities.  REVIEW OF DATA: I have reviewed the following data today:  Office Visit on 12/08/2017  Component Date Value  . Glucose 12/08/2017 87   . Sodium 12/08/2017 143   . Potassium 12/08/2017 3.6   . Chloride 12/08/2017 106   . Carbon Dioxide (CO2) 12/08/2017 27.4   . Urea Nitrogen (BUN) 12/08/2017 32*  . Creatinine 12/08/2017 1.7*  . Glomerular Filtration Ra* 12/08/2017 38*  . Calcium 12/08/2017 9.5   . AST  12/08/2017 23   . ALT  12/08/2017 15   . Alk Phos (alkaline Phosp* 12/08/2017 62   . Albumin 12/08/2017 4.1   . Bilirubin, Total 12/08/2017 0.6   . Protein, Total 12/08/2017 6.9   . A/G Ratio 12/08/2017 1.5   . WBC (White Blood Cell Co* 12/08/2017 8.0   . RBC (Red Blood Cell Coun* 12/08/2017 3.78*  . Hemoglobin 12/08/2017 11.7*  . Hematocrit 12/08/2017 35.0*  . MCV (Mean Corpuscular Vo* 12/08/2017 92.6   . MCH (Mean Corpuscular He* 12/08/2017 31.0   . MCHC (Mean Corpuscular H* 12/08/2017 33.4   . Platelet Count 12/08/2017 228   . RDW-CV (Red Cell Distrib* 12/08/2017 12.8   . MPV (Mean Platelet Volum* 12/08/2017 9.9   . Neutrophils 12/08/2017 4.51   . Lymphocytes 12/08/2017 2.54   . Monocytes 12/08/2017  0.71   . Eosinophils 12/08/2017 0.15   . Basophils 12/08/2017 0.04   . Neutrophil % 12/08/2017 56.5   . Lymphocyte % 12/08/2017 31.9   . Monocyte % 12/08/2017 8.9   . Eosinophil % 12/08/2017 1.9   . Basophil% 12/08/2017 0.5   . Immature Granulocyte % 12/08/2017 0.3   . Immature Granulocyte Cou* 12/08/2017 0.02      ASSESSMENT: Mr. Vert is a 84 y.o. male presenting for consultation for right inguinal hernia.    The patient presents with a symptomatic, reducible inguinal hernia. Patient was oriented about the diagnosis of inguinal hernia and its implication. The patient was oriented about the treatment alternatives (observation vs surgical repair). Due to patient symptoms and that it is increasing in size and bothering, repair is recommended. Patient oriented about the surgical procedure, the use of mesh and its risk of complications such as: infection, bleeding, injury to vas deference, vasculature and testicle, injury to bowel or bladder, and chronic pain.  PLAN: 1. Right inguinal hernia repair with mesh 2. Internal medicine clearance 3. CBC, CMP - done  Patient and his wife verbalized understanding, all questions were answered, and were agreeable with the plan outlined above.    Herbert Pun, MD  Electronically signed by Herbert Pun, MD

## 2017-12-20 ENCOUNTER — Encounter
Admission: RE | Admit: 2017-12-20 | Discharge: 2017-12-20 | Disposition: A | Discharge status: Home / Self Care | Payer: Medicare HMO | Source: Ambulatory Visit | Attending: General Surgery | Admitting: General Surgery

## 2017-12-20 ENCOUNTER — Other Ambulatory Visit: Payer: Self-pay

## 2017-12-20 DIAGNOSIS — Z01812 Encounter for preprocedural laboratory examination: Secondary | ICD-10-CM | POA: Insufficient documentation

## 2017-12-20 NOTE — Patient Instructions (Signed)
Your procedure is scheduled on: Wed. 12/29/17 Report to Day Surgery. To find out your arrival time please call [phone] between 1PM - 3PM on Tues. 12/28/17.  Remember: Instructions that are not followed completely may result in serious medical risk, up to and including death, or upon the discretion of your surgeon and anesthesiologist your surgery may need to be rescheduled.     _X__ 1. Do not eat food after midnight the night before your procedure.                 No gum chewing or hard candies. You may drink clear liquids up to 2 hours                 before you are scheduled to arrive for your surgery- DO not drink clear                 liquids within 2 hours of the start of your surgery.                 Clear Liquids include:  water, apple juice without pulp, clear carbohydrate                 drink such as Clearfast of Gartorade, Black Coffee or Tea (Do not add                 anything to coffee or tea).  __X__2.  On the morning of surgery brush your teeth with toothpaste and water, you may rinse your mouth with mouthwash if you wish.  Do not swallow any toothpaste of mouthwash.     _X__ 3.  No Alcohol for 24 hours before or after surgery.   _X__ 4.  Do Not Smoke or use e-cigarettes For 24 Hours Prior to Your Surgery.                 Do not use any chewable tobacco products for at least 6 hours prior to                 surgery.  ____  5.  Bring all medications with you on the day of surgery if instructed.   _x___  6.  Notify your doctor if there is any change in your medical condition      (cold, fever, infections).     Do not wear jewelry, make-up, hairpins, clips or nail polish. Do not wear lotions, powders, or perfumes. You may wear deodorant. Do not shave 48 hours prior to surgery. Men may shave face and neck. Do not bring valuables to the hospital.    [facility] is not responsible for any belongings or valuables.  Contacts, dentures or  bridgework may not be worn into surgery. Leave your suitcase in the car. After surgery it may be brought to your room. For patients admitted to the hospital, discharge time is determined by your treatment team.   Patients discharged the day of surgery will not be allowed to drive home.   Please read over the following fact sheets that you were given:    ____ Take these medicines the morning of surgery with A SIP OF WATER:    1. none  2.   3.   4.  5.  6.  ____ Fleet Enema (as directed)   _x___ Use CHG Soap as directed  ____ Use inhalers on the day of surgery  ____ Stop metformin 2 days prior to surgery    ____ Take 1/2 of  usual insulin dose the night before surgery. No insulin the morning          of surgery.   ____ Stop Coumadin/Plavix/aspirin on   ____ Stop Anti-inflammatories on    ____ Stop supplements until after surgery.    ____ Bring C-Pap to the hospital.

## 2017-12-28 MED ORDER — CEFAZOLIN SODIUM-DEXTROSE 2-4 GM/100ML-% IV SOLN: 2.0000 g | INTRAVENOUS | Status: DC

## 2017-12-29 ENCOUNTER — Encounter
Admission: RE | Disposition: A | Discharge status: Home / Self Care | Payer: Self-pay | Source: Ambulatory Visit | Attending: General Surgery

## 2017-12-29 ENCOUNTER — Other Ambulatory Visit: Payer: Self-pay

## 2017-12-29 ENCOUNTER — Ambulatory Visit: Payer: Medicare HMO | Admitting: Certified Registered Nurse Anesthetist

## 2017-12-29 ENCOUNTER — Encounter: Payer: Self-pay | Admitting: *Deleted

## 2017-12-29 ENCOUNTER — Ambulatory Visit
Admission: RE | Admit: 2017-12-29 | Discharge: 2017-12-29 | Disposition: A | Discharge status: Home / Self Care | Payer: Medicare HMO | Source: Ambulatory Visit | Attending: General Surgery | Admitting: General Surgery

## 2017-12-29 DIAGNOSIS — I129 Hypertensive chronic kidney disease with stage 1 through stage 4 chronic kidney disease, or unspecified chronic kidney disease: Secondary | ICD-10-CM | POA: Diagnosis not present

## 2017-12-29 DIAGNOSIS — Z79899 Other long term (current) drug therapy: Secondary | ICD-10-CM | POA: Diagnosis not present

## 2017-12-29 DIAGNOSIS — Z885 Allergy status to narcotic agent status: Secondary | ICD-10-CM | POA: Insufficient documentation

## 2017-12-29 DIAGNOSIS — E785 Hyperlipidemia, unspecified: Secondary | ICD-10-CM | POA: Diagnosis not present

## 2017-12-29 DIAGNOSIS — K4090 Unilateral inguinal hernia, without obstruction or gangrene, not specified as recurrent: Secondary | ICD-10-CM | POA: Diagnosis present

## 2017-12-29 DIAGNOSIS — Z886 Allergy status to analgesic agent status: Secondary | ICD-10-CM | POA: Diagnosis not present

## 2017-12-29 DIAGNOSIS — G629 Polyneuropathy, unspecified: Secondary | ICD-10-CM | POA: Diagnosis not present

## 2017-12-29 DIAGNOSIS — M5136 Other intervertebral disc degeneration, lumbar region: Secondary | ICD-10-CM | POA: Insufficient documentation

## 2017-12-29 DIAGNOSIS — N183 Chronic kidney disease, stage 3 (moderate): Secondary | ICD-10-CM | POA: Diagnosis not present

## 2017-12-29 DIAGNOSIS — E538 Deficiency of other specified B group vitamins: Secondary | ICD-10-CM | POA: Diagnosis not present

## 2017-12-29 HISTORY — PX: INGUINAL HERNIA REPAIR: SHX194

## 2017-12-29 HISTORY — PX: INSERTION OF MESH: SHX5868

## 2017-12-29 SURGERY — REPAIR, HERNIA, INGUINAL, ADULT
Anesthesia: General | Laterality: Right

## 2017-12-29 MED ORDER — LIDOCAINE HCL (CARDIAC) 20 MG/ML IV SOLN
INTRAVENOUS | Status: DC | PRN
  Administered 2017-12-29: 100 mg via INTRAVENOUS

## 2017-12-29 MED ORDER — FENTANYL CITRATE (PF) 100 MCG/2ML IJ SOLN: 25.0000 ug | INTRAMUSCULAR | Status: DC | PRN

## 2017-12-29 MED ORDER — BUPIVACAINE-EPINEPHRINE (PF) 0.25% -1:200000 IJ SOLN
INTRAMUSCULAR | Status: AC
  Filled 2017-12-29: qty 30

## 2017-12-29 MED ORDER — SUGAMMADEX SODIUM 200 MG/2ML IV SOLN
INTRAVENOUS | Status: DC | PRN
  Administered 2017-12-29: 200.4 mg via INTRAVENOUS

## 2017-12-29 MED ORDER — DEXAMETHASONE SODIUM PHOSPHATE 10 MG/ML IJ SOLN
INTRAMUSCULAR | Status: AC
  Filled 2017-12-29: qty 1

## 2017-12-29 MED ORDER — FENTANYL CITRATE (PF) 100 MCG/2ML IJ SOLN
INTRAMUSCULAR | Status: AC
  Filled 2017-12-29: qty 2

## 2017-12-29 MED ORDER — KETOROLAC TROMETHAMINE 30 MG/ML IJ SOLN
INTRAMUSCULAR | Status: AC
  Filled 2017-12-29: qty 1

## 2017-12-29 MED ORDER — FAMOTIDINE 20 MG PO TABS
ORAL_TABLET | ORAL | Status: AC
  Administered 2017-12-29: 20 mg via ORAL
  Filled 2017-12-29: qty 1

## 2017-12-29 MED ORDER — MIDAZOLAM HCL 2 MG/2ML IJ SOLN
INTRAMUSCULAR | Status: DC | PRN
  Administered 2017-12-29: .5 mg via INTRAVENOUS
  Administered 2017-12-29: 0.5 mg via INTRAVENOUS

## 2017-12-29 MED ORDER — SEVOFLURANE IN SOLN
RESPIRATORY_TRACT | Status: AC
  Filled 2017-12-29: qty 250

## 2017-12-29 MED ORDER — PROPOFOL 10 MG/ML IV BOLUS
INTRAVENOUS | Status: DC | PRN
  Administered 2017-12-29: 150 mg via INTRAVENOUS

## 2017-12-29 MED ORDER — ACETAMINOPHEN 10 MG/ML IV SOLN
INTRAVENOUS | Status: AC
  Filled 2017-12-29: qty 100

## 2017-12-29 MED ORDER — EPHEDRINE SULFATE 50 MG/ML IJ SOLN
INTRAMUSCULAR | Status: DC | PRN
  Administered 2017-12-29: 10 mg via INTRAVENOUS

## 2017-12-29 MED ORDER — LACTATED RINGERS IV SOLN
INTRAVENOUS | Status: DC | PRN
  Administered 2017-12-29 (×2): via INTRAVENOUS

## 2017-12-29 MED ORDER — ONDANSETRON HCL 4 MG/2ML IJ SOLN: 4.0000 mg | Freq: Once | INTRAMUSCULAR | Status: DC | PRN

## 2017-12-29 MED ORDER — CEFAZOLIN SODIUM-DEXTROSE 2-4 GM/100ML-% IV SOLN
INTRAVENOUS | Status: AC
  Filled 2017-12-29: qty 100

## 2017-12-29 MED ORDER — KETOROLAC TROMETHAMINE 30 MG/ML IJ SOLN
INTRAMUSCULAR | Status: DC | PRN
  Administered 2017-12-29: 30 mg via INTRAVENOUS

## 2017-12-29 MED ORDER — PROPOFOL 10 MG/ML IV BOLUS
INTRAVENOUS | Status: AC
  Filled 2017-12-29: qty 20

## 2017-12-29 MED ORDER — LACTATED RINGERS IV SOLN
INTRAVENOUS | Status: DC
  Administered 2017-12-29: 07:00:00 via INTRAVENOUS

## 2017-12-29 MED ORDER — MIDAZOLAM HCL 2 MG/2ML IJ SOLN
INTRAMUSCULAR | Status: AC
  Filled 2017-12-29: qty 2

## 2017-12-29 MED ORDER — BUPIVACAINE-EPINEPHRINE 0.25% -1:200000 IJ SOLN
INTRAMUSCULAR | Status: DC | PRN
  Administered 2017-12-29: 10 mL

## 2017-12-29 MED ORDER — TRAMADOL HCL 50 MG PO TABS: 50.0000 mg | ORAL_TABLET | Freq: Four times a day (QID) | ORAL | 0 refills | Status: AC | PRN

## 2017-12-29 MED ORDER — ROCURONIUM BROMIDE 100 MG/10ML IV SOLN
INTRAVENOUS | Status: DC | PRN
  Administered 2017-12-29: 2 mg via INTRAVENOUS
  Administered 2017-12-29: 48 mg via INTRAVENOUS

## 2017-12-29 MED ORDER — ONDANSETRON HCL 4 MG/2ML IJ SOLN
INTRAMUSCULAR | Status: DC | PRN
  Administered 2017-12-29: 4 mg via INTRAVENOUS

## 2017-12-29 MED ORDER — DEXAMETHASONE SODIUM PHOSPHATE 10 MG/ML IJ SOLN
INTRAMUSCULAR | Status: DC | PRN
  Administered 2017-12-29: 10 mg via INTRAVENOUS

## 2017-12-29 MED ORDER — LIDOCAINE HCL (PF) 2 % IJ SOLN
INTRAMUSCULAR | Status: AC
  Filled 2017-12-29: qty 10

## 2017-12-29 MED ORDER — ONDANSETRON HCL 4 MG/2ML IJ SOLN
INTRAMUSCULAR | Status: AC
  Filled 2017-12-29: qty 2

## 2017-12-29 MED ORDER — FENTANYL CITRATE (PF) 100 MCG/2ML IJ SOLN
INTRAMUSCULAR | Status: DC | PRN
  Administered 2017-12-29 (×2): 25 ug via INTRAVENOUS

## 2017-12-29 MED ORDER — FAMOTIDINE 20 MG PO TABS
20.0000 mg | ORAL_TABLET | Freq: Once | ORAL | Status: AC
  Administered 2017-12-29: 20 mg via ORAL

## 2017-12-29 SURGICAL SUPPLY — 29 items
BLADE SURG 15 STRL LF DISP TIS (BLADE) ×1 IMPLANT
BLADE SURG 15 STRL SS (BLADE) ×1
CANISTER SUCT 1200ML W/VALVE (MISCELLANEOUS) ×2 IMPLANT
CHLORAPREP W/TINT 26ML (MISCELLANEOUS) ×2 IMPLANT
DERMABOND ADVANCED (GAUZE/BANDAGES/DRESSINGS) ×1
DERMABOND ADVANCED .7 DNX12 (GAUZE/BANDAGES/DRESSINGS) ×1 IMPLANT
DRAIN PENROSE 1/4X12 LTX (DRAIN) ×2 IMPLANT
DRAPE LAPAROTOMY 100X77 ABD (DRAPES) ×2 IMPLANT
ELECT REM PT RETURN 9FT ADLT (ELECTROSURGICAL) ×2
ELECTRODE REM PT RTRN 9FT ADLT (ELECTROSURGICAL) ×1 IMPLANT
GLOVE BIO SURGEON STRL SZ 6.5 (GLOVE) ×4 IMPLANT
GLOVE BIOGEL PI IND STRL 6.5 (GLOVE) ×1 IMPLANT
GLOVE BIOGEL PI INDICATOR 6.5 (GLOVE) ×1
GOWN STRL REUS W/ TWL LRG LVL3 (GOWN DISPOSABLE) ×2 IMPLANT
GOWN STRL REUS W/TWL LRG LVL3 (GOWN DISPOSABLE) ×2
LABEL OR SOLS (LABEL) ×2 IMPLANT
MESH HERNIA 6X12 ULTRAPRO MED (Mesh General) ×1 IMPLANT
MESH HERNIA ULTRAPRO MED (Mesh General) ×1 IMPLANT
NEEDLE HYPO 22GX1.5 SAFETY (NEEDLE) ×2 IMPLANT
NS IRRIG 500ML POUR BTL (IV SOLUTION) ×2 IMPLANT
PACK BASIN MINOR ARMC (MISCELLANEOUS) ×2 IMPLANT
SUT MNCRL 4-0 (SUTURE) ×1
SUT MNCRL 4-0 27XMFL (SUTURE) ×1
SUT SURGILON 0 BLK (SUTURE) ×4 IMPLANT
SUT VIC AB 2-0 CT2 27 (SUTURE) ×2 IMPLANT
SUT VIC AB 3-0 SH 27 (SUTURE) ×2
SUT VIC AB 3-0 SH 27X BRD (SUTURE) ×2 IMPLANT
SUTURE MNCRL 4-0 27XMF (SUTURE) ×1 IMPLANT
SYR 10ML LL (SYRINGE) ×2 IMPLANT

## 2017-12-29 NOTE — Anesthesia Post-op Follow-up Note (Signed)
Anesthesia QCDR form completed.        

## 2017-12-29 NOTE — Anesthesia Preprocedure Evaluation (Signed)
Anesthesia Evaluation  Patient identified by MRN, date of birth, ID band Patient awake    Reviewed: Allergy & Precautions, H&P , NPO status , Patient's Chart, lab work & pertinent test results, reviewed documented beta blocker date and time   History of Anesthesia Complications Negative for: history of anesthetic complications  Airway Mallampati: III  TM Distance: >3 FB Neck ROM: full    Dental  (+) Missing, Dental Advidsory Given   Pulmonary neg pulmonary ROS,           Cardiovascular Exercise Tolerance: Good hypertension, (-) angina(-) CAD, (-) Past MI, (-) Cardiac Stents and (-) CABG (-) dysrhythmias (-) Valvular Problems/Murmurs     Neuro/Psych neg Seizures CVA negative psych ROS   GI/Hepatic Neg liver ROS, PUD, neg GERD  ,  Endo/Other  negative endocrine ROS  Renal/GU negative Renal ROS  negative genitourinary   Musculoskeletal   Abdominal   Peds  Hematology negative hematology ROS (+)   Anesthesia Other Findings Past Medical History: No date: Cancer ([place])     Comment:  skin cancer face No date: Hyperlipidemia No date: Hypertension No date: Stomach ulcer No date: Stroke [facility])     Comment:  feb 2017, weakness in legs,    Reproductive/Obstetrics negative OB ROS                             Anesthesia Physical Anesthesia Plan  ASA: II  Anesthesia Plan: General   Post-op Pain Management:    Induction: Intravenous  PONV Risk Score and Plan: 2 and Ondansetron and Dexamethasone  Airway Management Planned: Oral ETT  Additional Equipment:   Intra-op Plan:   Post-operative Plan: Extubation in OR  Informed Consent: I have reviewed the patients History and Physical, chart, labs and discussed the procedure including the risks, benefits and alternatives for the proposed anesthesia with the patient or authorized representative who has indicated his/her understanding and acceptance.    Dental Advisory Given  Plan Discussed with: Anesthesiologist, CRNA and Surgeon  Anesthesia Plan Comments:         Anesthesia Quick Evaluation

## 2017-12-29 NOTE — Brief Op Note (Signed)
12/29/2017  2:08 PM  PATIENT:  Leroy [name]  84 y.o. male  PRE-OPERATIVE DIAGNOSIS:  NON RECURRENT UNILATERAL INGUINAL HERNIA  POST-OPERATIVE DIAGNOSIS:  NON RECURRENT UNILATERAL INGUINAL HERNIA  PROCEDURE:  Procedure(s): HERNIA REPAIR INGUINAL ADULT (Right) INSERTION OF MESH (Right)  SURGEON:  Surgeon(s) and Role:    Herbert Pun, MD - Primary

## 2017-12-29 NOTE — OR Nursing (Signed)
Discharge instructions discussed with pt and family. Both voice understanding. 

## 2017-12-29 NOTE — Op Note (Signed)
Preoperative diagnosis: Right Inguinal Hernia.  Postoperative diagnosis: Right Indirect Inguinal Hernia.  Procedure: Right Inguinal hernia repair with mesh  Anesthesia: General  Surgeon: Dr. Windell Moment  Wound Classification: Clean  Indications:  Patient is a 84 y.o. male developed a symptomatic right inguinal hernia. Repair was indicated to avoid complications of incarceration, obstruction and pain, and a prosthetic mesh repair was elected.  Findings: 1. Vas Deferens and cord structures identified and preserved 2. A indirect inguinal hernia was identified 3. UltraPro Hernia System used for repair 4. Adequate hemostasis achieved  Description of procedure: The patient was taken to the operating room. A time-out was completed verifying correct patient, procedure, site, positioning, and implant(s) and/or special equipment prior to beginning this procedure. spinal anesthesia was induced. The right groin was prepped and draped in the usual sterile fashion. An incision was marked in a natural skin crease and planned to end near the pubic tubercle.  The skin crease incision was made with a knife and deepened through Scarpa's and Camper's fascia with electrocautery until the aponeurosis of the external oblique was encountered. This was cleaned and the external ring was exposed. Hemostasis was achieved in the wound. An incision was made in the midportion of the external oblique aponeurosis in the direction of its fibers. The ilioinguinal nerve was identified and protected throughout the dissection. Flaps of the external oblique were developed cephalad and inferiorly.  The cord was identified. It was gently dissected free at the pubic tubercle and encircled with a Penrose drain. Attention was directed to the anteromedial aspect of the cord, where an indirect hernia sac was identified. The sac was carefully dissected free of the cord down to the level of the internal ring. A cord lipoma was identified  attached to the sac and dissected to its base. The lipoma was ligated and divided. The vas and testicular vessels were identified and protected from harm.  The sac was twisted and suture ligated with 2-0 vycril. Redundant sac was excised and submitted to pathology. The stump of the sac was checked for hemostasis and allowed to retract into the abdomen.  Attention then turned to the floor of the canal, which appeared to be grossly weakened without a well-defined defect or sac. The UltraPro Hernia System mesh was inserted. Beginning at the pubic tubercle, the mesh was sutured to the inguinal ligament inferiorly and the conjoint tendon superiorly using interrupted 0 nonabsorbable sutures. Care was taken to assure that the mesh was placed in a relaxed fashion to avoid excessive tension and that no neurovascular structures were caught in the repair. Laterally, the tails of the mesh were crossed and the internal ring recreated.  Hemostasis was again checked. The Penrose drain was removed. The external oblique aponeurosis was closed with a running suture of 3-0 Vicryl, taking care not to catch the ilioinguinal nerve in the suture line. Scarpa's fascia was closed with interrupted 3-0 Vicryl.  The skin was closed with a subcuticular stitch of Monocryl 4-0. Dermabond was applied.  The testis was gently pulled down into its anatomic position in the scrotum.  The patient tolerated the procedure well and was taken to the postanesthesia care unit in stable condition.   Specimen: Hernia sac and cord lipoma  Complications: None  Estimated Blood Loss: 45mL

## 2017-12-29 NOTE — Anesthesia Procedure Notes (Signed)
Procedure Name: Intubation Date/Time: 12/29/2017 7:33 AM Performed by: Carron Curie, CRNA Pre-anesthesia Checklist: Patient identified, Emergency Drugs available, Suction available, Patient being monitored and Timeout performed Patient Re-evaluated:Patient Re-evaluated prior to induction Oxygen Delivery Method: Circle system utilized Preoxygenation: Pre-oxygenation with 100% oxygen Induction Type: IV induction Ventilation: Mask ventilation without difficulty Laryngoscope Size: 4 Grade View: Grade III Tube type: Oral Tube size: 7.5 mm Number of attempts: 1 Airway Equipment and Method: Stylet Placement Confirmation: ETT inserted through vocal cords under direct vision,  positive ETCO2 and breath sounds checked- equal and bilateral Secured at: 22 cm Tube secured with: Tape Dental Injury: Teeth and Oropharynx as per pre-operative assessment

## 2017-12-29 NOTE — Interval H&P Note (Signed)
History and Physical Interval Note:  12/29/2017 6:55 AM  Leroy [name]  has presented today for surgery, with the diagnosis of NON RECURRENT UNILATERAL INGUINAL HERNIA  The various methods of treatment have been discussed with the patient and family. After consideration of risks, benefits and other options for treatment, the patient has consented to  Procedure(s): HERNIA REPAIR INGUINAL ADULT (Right) INSERTION OF MESH (Right) as a surgical intervention .  The patient's history has been reviewed, patient examined, no change in status, stable for surgery.  I have reviewed the patient's chart and labs.  The correct site of surgery was marked in the pre procedure room. Questions were answered to the patient's satisfaction.     Herbert Pun

## 2017-12-29 NOTE — Transfer of Care (Signed)
Immediate Anesthesia Transfer of Care Note  Patient: Leroy [name]  Procedure(s) Performed: HERNIA REPAIR INGUINAL ADULT (Right ) INSERTION OF MESH (Right )  Patient Location: PACU  Anesthesia Type:General  Level of Consciousness: awake  Airway & Oxygen Therapy: Patient Spontanous Breathing  Post-op Assessment: Report given to RN  Post vital signs: stable  Last Vitals:  Vitals:   12/29/17 0617 12/29/17 0933  BP: 127/79 139/78  Pulse: 73 73  Resp: 18 15  Temp: (!) 36.2 C (!) 36.3 C  SpO2: 99% 98%    Last Pain:  Vitals:   12/29/17 0617  TempSrc: Tympanic         Complications: No apparent anesthesia complications

## 2017-12-29 NOTE — Anesthesia Postprocedure Evaluation (Signed)
Anesthesia Post Note  Patient: Leroy [name]  Procedure(s) Performed: HERNIA REPAIR INGUINAL ADULT (Right ) INSERTION OF MESH (Right )  Patient location during evaluation: PACU Anesthesia Type: General Level of consciousness: awake and alert Pain management: pain level controlled Vital Signs Assessment: post-procedure vital signs reviewed and stable Respiratory status: spontaneous breathing, nonlabored ventilation, respiratory function stable and patient connected to nasal cannula oxygen Cardiovascular status: blood pressure returned to baseline and stable Postop Assessment: no apparent nausea or vomiting Anesthetic complications: no     Last Vitals:  Vitals:   12/29/17 1014 12/29/17 1036  BP: (!) 146/83 136/75  Pulse: 70 69  Resp: 16 16  Temp: (!) 36.3 C   SpO2: 97% 98%    Last Pain:  Vitals:   12/29/17 0617  TempSrc: Tympanic                 Martha Clan

## 2017-12-29 NOTE — Discharge Instructions (Signed)
°  Diet: Resume home heart healthy regular diet.   Activity: No heavy lifting >10 pounds (children, pets, laundry, garbage) or strenuous activity until follow-up, but light activity and walking are encouraged. Do not drive or drink alcohol if taking narcotic pain medications.  Wound care: May shower with soapy water and pat dry (do not rub incisions), but no baths or submerging incision underwater until follow-up. (no swimming)   Medications: Resume all home medications. For mild to moderate pain: acetaminophen (Tylenol). Combining Tylenol with alcohol can substantially increase your risk of causing liver disease. Narcotic pain medications, if prescribed, can be used for severe pain, though may cause nausea, constipation, and drowsiness. Do not combine Tylenol and Percocet within a 6 hour period as Percocet contains Tylenol. If you do not need the narcotic pain medication, you do not need to fill the prescription.  If develops constipation may need to use a laxative or a stool softener. (e.g. Miralax, Dulcolax, Senokot)  Call office [phone]) at any time if any questions, worsening pain, fevers/chills, bleeding, drainage from incision site, or other concerns.    AMBULATORY SURGERY  DISCHARGE INSTRUCTIONS   1) The drugs that you were given will stay in your system until tomorrow so for the next 24 hours you should not:  A) Drive an automobile B) Make any legal decisions C) Drink any alcoholic beverage   2) You may resume regular meals tomorrow.  Today it is better to start with liquids and gradually work up to solid foods.  You may eat anything you prefer, but it is better to start with liquids, then soup and crackers, and gradually work up to solid foods.   3) Please notify your doctor immediately if you have any unusual bleeding, trouble breathing, redness and pain at the surgery site, drainage, fever, or pain not relieved by medication.    4) Additional  Instructions:        Please contact your physician with any problems or Same Day Surgery at [phone], Monday through Friday 6 am to 4 pm, or [place] at [facility] number at [phone].

## 2017-12-30 LAB — SURGICAL PATHOLOGY

## 2018-01-13 ENCOUNTER — Encounter: Payer: Self-pay | Admitting: General Surgery

## 2018-06-22 DIAGNOSIS — E559 Vitamin D deficiency, unspecified: Secondary | ICD-10-CM | POA: Insufficient documentation

## 2018-09-22 DIAGNOSIS — G309 Alzheimer's disease, unspecified: Secondary | ICD-10-CM | POA: Insufficient documentation

## 2018-09-22 DIAGNOSIS — F0151 Vascular dementia with behavioral disturbance: Secondary | ICD-10-CM | POA: Insufficient documentation

## 2018-10-06 DIAGNOSIS — R159 Full incontinence of feces: Secondary | ICD-10-CM | POA: Insufficient documentation

## 2018-10-06 DIAGNOSIS — K5901 Slow transit constipation: Secondary | ICD-10-CM | POA: Insufficient documentation

## 2018-10-21 ENCOUNTER — Other Ambulatory Visit: Payer: Self-pay | Admitting: Internal Medicine

## 2018-10-21 DIAGNOSIS — R1310 Dysphagia, unspecified: Secondary | ICD-10-CM

## 2018-10-28 ENCOUNTER — Ambulatory Visit
Admission: RE | Admit: 2018-10-28 | Discharge: 2018-10-28 | Disposition: A | Discharge status: Home / Self Care | Payer: Medicare HMO | Source: Ambulatory Visit | Attending: Internal Medicine | Admitting: Internal Medicine

## 2018-10-28 DIAGNOSIS — R1310 Dysphagia, unspecified: Secondary | ICD-10-CM

## 2018-11-08 ENCOUNTER — Encounter: Payer: Self-pay | Admitting: *Deleted

## 2018-11-09 ENCOUNTER — Ambulatory Visit
Admission: RE | Admit: 2018-11-09 | Discharge: 2018-11-09 | Disposition: A | Discharge status: Home / Self Care | Payer: Medicare HMO | Attending: Unknown Physician Specialty | Admitting: Unknown Physician Specialty

## 2018-11-09 ENCOUNTER — Ambulatory Visit: Payer: Medicare HMO | Admitting: Anesthesiology

## 2018-11-09 ENCOUNTER — Encounter
Admission: RE | Disposition: A | Discharge status: Home / Self Care | Payer: Self-pay | Source: Home / Self Care | Attending: Unknown Physician Specialty

## 2018-11-09 ENCOUNTER — Encounter: Payer: Self-pay | Admitting: *Deleted

## 2018-11-09 ENCOUNTER — Other Ambulatory Visit: Payer: Self-pay

## 2018-11-09 DIAGNOSIS — Z85828 Personal history of other malignant neoplasm of skin: Secondary | ICD-10-CM | POA: Insufficient documentation

## 2018-11-09 DIAGNOSIS — Z6829 Body mass index (BMI) 29.0-29.9, adult: Secondary | ICD-10-CM | POA: Diagnosis not present

## 2018-11-09 DIAGNOSIS — N189 Chronic kidney disease, unspecified: Secondary | ICD-10-CM | POA: Diagnosis not present

## 2018-11-09 DIAGNOSIS — R531 Weakness: Secondary | ICD-10-CM | POA: Diagnosis not present

## 2018-11-09 DIAGNOSIS — E669 Obesity, unspecified: Secondary | ICD-10-CM | POA: Insufficient documentation

## 2018-11-09 DIAGNOSIS — E785 Hyperlipidemia, unspecified: Secondary | ICD-10-CM | POA: Insufficient documentation

## 2018-11-09 DIAGNOSIS — K222 Esophageal obstruction: Secondary | ICD-10-CM | POA: Insufficient documentation

## 2018-11-09 DIAGNOSIS — F0390 Unspecified dementia without behavioral disturbance: Secondary | ICD-10-CM | POA: Insufficient documentation

## 2018-11-09 DIAGNOSIS — E559 Vitamin D deficiency, unspecified: Secondary | ICD-10-CM | POA: Insufficient documentation

## 2018-11-09 DIAGNOSIS — Z79899 Other long term (current) drug therapy: Secondary | ICD-10-CM | POA: Insufficient documentation

## 2018-11-09 DIAGNOSIS — R1310 Dysphagia, unspecified: Secondary | ICD-10-CM | POA: Insufficient documentation

## 2018-11-09 DIAGNOSIS — I129 Hypertensive chronic kidney disease with stage 1 through stage 4 chronic kidney disease, or unspecified chronic kidney disease: Secondary | ICD-10-CM | POA: Insufficient documentation

## 2018-11-09 DIAGNOSIS — I69398 Other sequelae of cerebral infarction: Secondary | ICD-10-CM | POA: Insufficient documentation

## 2018-11-09 DIAGNOSIS — I739 Peripheral vascular disease, unspecified: Secondary | ICD-10-CM | POA: Diagnosis not present

## 2018-11-09 HISTORY — DX: Unspecified dementia, unspecified severity, without behavioral disturbance, psychotic disturbance, mood disturbance, and anxiety: F03.90

## 2018-11-09 HISTORY — DX: Chronic kidney disease, unspecified: N18.9

## 2018-11-09 HISTORY — DX: Vitamin D deficiency, unspecified: E55.9

## 2018-11-09 HISTORY — DX: Bursopathy, unspecified: M71.9

## 2018-11-09 HISTORY — DX: Tremor, unspecified: R25.1

## 2018-11-09 HISTORY — DX: Deficiency of other specified B group vitamins: E53.8

## 2018-11-09 HISTORY — DX: Other symptoms and signs involving the musculoskeletal system: R29.898

## 2018-11-09 HISTORY — DX: Gastrointestinal hemorrhage, unspecified: K92.2

## 2018-11-09 HISTORY — DX: Anemia, unspecified: D64.9

## 2018-11-09 HISTORY — DX: Peripheral vascular disease, unspecified: I73.9

## 2018-11-09 HISTORY — DX: Dyspnea, unspecified: R06.00

## 2018-11-09 HISTORY — DX: Obesity, unspecified: E66.9

## 2018-11-09 HISTORY — DX: Personal history of other diseases of the digestive system: Z87.19

## 2018-11-09 HISTORY — DX: Nonspecific intraventricular block: I45.4

## 2018-11-09 HISTORY — PX: ESOPHAGOGASTRODUODENOSCOPY (EGD) WITH PROPOFOL: SHX5813

## 2018-11-09 SURGERY — ESOPHAGOGASTRODUODENOSCOPY (EGD) WITH PROPOFOL
Anesthesia: General

## 2018-11-09 MED ORDER — FENTANYL CITRATE (PF) 100 MCG/2ML IJ SOLN
INTRAMUSCULAR | Status: DC | PRN
  Administered 2018-11-09: 50 ug via INTRAVENOUS

## 2018-11-09 MED ORDER — SODIUM CHLORIDE 0.9 % IV SOLN
INTRAVENOUS | Status: DC
  Administered 2018-11-09: 13:00:00 via INTRAVENOUS

## 2018-11-09 MED ORDER — LIDOCAINE HCL (CARDIAC) PF 100 MG/5ML IV SOSY
PREFILLED_SYRINGE | INTRAVENOUS | Status: DC | PRN
  Administered 2018-11-09: 80 mg via INTRAVENOUS

## 2018-11-09 MED ORDER — SODIUM CHLORIDE 0.9 % IV SOLN: INTRAVENOUS | Status: DC

## 2018-11-09 MED ORDER — PROPOFOL 10 MG/ML IV BOLUS
INTRAVENOUS | Status: AC
  Filled 2018-11-09: qty 40

## 2018-11-09 MED ORDER — PROPOFOL 10 MG/ML IV BOLUS
INTRAVENOUS | Status: DC | PRN
  Administered 2018-11-09 (×3): 50 mg via INTRAVENOUS

## 2018-11-09 MED ORDER — FENTANYL CITRATE (PF) 100 MCG/2ML IJ SOLN
INTRAMUSCULAR | Status: AC
  Filled 2018-11-09: qty 2

## 2018-11-09 NOTE — Op Note (Addendum)
[facility] Patient Name: Leroy [name] Procedure Date: 11/09/2018 1:00 PM MRN: [id number] Account #: [id number] Date of Birth: 05/05/1930 Admit Type: Outpatient Age: 85 Room: [facility] ENDO ROOM 2 Gender: Male Note Status: Finalized Procedure:            Upper GI endoscopy Indications:          Dysphagia Providers:            Manya Silvas, MD Referring MD:         [name]. Doy Hutching, MD (Referring MD) Medicines:            Propofol per Anesthesia Complications:        No immediate complications. Procedure:            Pre-Anesthesia Assessment:                       - After reviewing the risks and benefits, the patient                        was deemed in satisfactory condition to undergo the                        procedure.                       After obtaining informed consent, the endoscope was                        passed under direct vision. Throughout the procedure,                        the patient's blood pressure, pulse, and oxygen                        saturations were monitored continuously. The Endoscope                        was introduced through the mouth, and advanced to the                        second part of duodenum. The upper GI endoscopy was                        accomplished without difficulty. The patient tolerated                        the procedure well. Findings:      A moderate Schatzki ring was found at the gastroesophageal junction.       Other rings noted. After exam of the stomach A guidewire was placed and       the scope was withdrawn. Dilation was dilated with a Savary dilator of       38, 42, 45 over the wire.      The scope was passed again down and the distal esophagus had been       dilated with the dilator. Small amount of blood seen in distal stricture       and on the stricture.      Due to first time dilatation I did not use a larger dilator. Impression:           - Moderate Schatzki  ring.                  - No specimens collected. Recommendation:       - Discharge patient to home.                       - The findings and recommendations were discussed with                        the patient's family.                       Eat slowly, chew well take small bites. For next 2-3                        days eat very soft food, like mashed potatos, scrambled                        eggs, pudding, yogurt. Manya Silvas, MD 11/09/2018 1:48:10 PM This report has been signed electronically. Number of Addenda: 0 Note Initiated On: 11/09/2018 1:00 PM      [facility]

## 2018-11-09 NOTE — Transfer of Care (Signed)
Immediate Anesthesia Transfer of Care Note  Patient: Leroy [name]  Procedure(s) Performed: ESOPHAGOGASTRODUODENOSCOPY (EGD) WITH PROPOFOL (N/A )  Patient Location: PACU  Anesthesia Type:General  Level of Consciousness: awake and alert   Airway & Oxygen Therapy: Patient Spontanous Breathing and Patient connected to nasal cannula oxygen  Post-op Assessment: Report given to RN and Post -op Vital signs reviewed and stable  Post vital signs: Reviewed and stable  Last Vitals:  Vitals Value Taken Time  BP 97/62 11/09/2018  1:42 PM  Temp 36.2 C 11/09/2018  1:44 PM  Pulse 68 11/09/2018  1:46 PM  Resp 18 11/09/2018  1:46 PM  SpO2 95 % 11/09/2018  1:46 PM  Vitals shown include unvalidated device data.  Last Pain:  Vitals:   11/09/18 1342  TempSrc:   PainSc: Asleep         Complications: No apparent anesthesia complications

## 2018-11-09 NOTE — Anesthesia Postprocedure Evaluation (Signed)
Anesthesia Post Note  Patient: Leroy [name]  Procedure(s) Performed: ESOPHAGOGASTRODUODENOSCOPY (EGD) WITH PROPOFOL (N/A )  Patient location during evaluation: Endoscopy Anesthesia Type: General Level of consciousness: awake and alert and oriented Pain management: pain level controlled Vital Signs Assessment: post-procedure vital signs reviewed and stable Respiratory status: spontaneous breathing, nonlabored ventilation and respiratory function stable Cardiovascular status: blood pressure returned to baseline and stable Postop Assessment: no signs of nausea or vomiting Anesthetic complications: no     Last Vitals:  Vitals:   11/09/18 1412 11/09/18 1413  BP: 138/74 138/74  Pulse: 66 64  Resp: 16 16  Temp:    SpO2: 96% 97%    Last Pain:  Vitals:   11/09/18 1413  TempSrc:   PainSc: 0-No pain                 Estevan Kersh

## 2018-11-09 NOTE — H&P (Signed)
Primary Care Physician:  Idelle Crouch, MD Primary Gastroenterologist:  Dr. Vira Agar  Pre-Procedure History & Physical: HPI:  Leroy [name] is a 85 y.o. male is here for an endoscopy.  He has frequent dysphagia for many years.   Past Medical History:  Diagnosis Date  . Anemia   . Bursitis   . Cancer ([place])    skin cancer face  . Chronic kidney disease   . Dementia ([place])   . Dyspnea   . GI bleed   . History of hiatal hernia   . Hyperlipidemia   . Hypertension   . IVCD (intraventricular conduction defect)   . Leg weakness, bilateral   . Obesity   . Peripheral vascular disease ([place])   . Stomach ulcer   . Stroke [facility])    feb 2017, weakness in legs,   . Tremor   . Vitamin B 12 deficiency   . Vitamin D deficiency     Past Surgical History:  Procedure Laterality Date  . BACK SURGERY    . EYE SURGERY      bilateral cataracts  . HERNIA REPAIR     inguinal on left  . INGUINAL HERNIA REPAIR Right 12/29/2017   Procedure: HERNIA REPAIR INGUINAL ADULT;  Surgeon: Herbert Pun, MD;  Location: [facility] ORS;  Service: General;  Laterality: Right;  . INSERTION OF MESH Right 12/29/2017   Procedure: INSERTION OF MESH;  Surgeon: Herbert Pun, MD;  Location: [facility] ORS;  Service: General;  Laterality: Right;  . LUMBAR LAMINECTOMY/DECOMPRESSION MICRODISCECTOMY N/A 08/12/2016   Procedure: LAMINECTOMY AND FORAMINOTOMY LUMBAR FOUR - LUMBAR FIVE;  Surgeon: Newman Pies, MD;  Location: [place];  Service: Neurosurgery;  Laterality: N/A;  LAMINECTOMY AND FORAMINOTOMY L4-L5    Prior to Admission medications   Medication Sig Start Date End Date Taking? Authorizing Provider  acetaminophen (TYLENOL) 500 MG tablet Take 500 mg by mouth daily as needed for moderate pain or headache.   Yes [provider]  atorvastatin (LIPITOR) 40 MG tablet Take 1 tablet (40 mg total) by mouth daily. 12/14/15  Yes Rinehuls, Early Chars, PA-C  cholecalciferol (VITAMIN D) 1000 units tablet Take 1,000  Units by mouth daily.   Yes [provider]  lamoTRIgine (LAMICTAL) 25 MG tablet Take 50 mg by mouth daily.   Yes [provider]  losartan-hydrochlorothiazide (HYZAAR) 50-12.5 MG tablet Take 1 tablet by mouth daily. 03/18/17  Yes [provider]    Allergies as of 11/08/2018 - Review Complete 11/08/2018  Allergen Reaction Noted  . Aspirin Other (See Comments) 12/12/2015    Family History  Problem Relation Age of Onset  . CAD Mother   . Cancer Father     Social History   Socioeconomic History  . Marital status: Married    Spouse name: Not on file  . Number of children: Not on file  . Years of education: Not on file  . Highest education level: Not on file  Occupational History  . Not on file  Social Needs  . Financial resource strain: Not on file  . Food insecurity:    Worry: Not on file    Inability: Not on file  . Transportation needs:    Medical: Not on file    Non-medical: Not on file  Tobacco Use  . Smoking status: Never Smoker  . Smokeless tobacco: Current User    Types: Chew  Substance and Sexual Activity  . Alcohol use: No  . Drug use: No  . Sexual activity: Not  on file  Lifestyle  . Physical activity:    Days per week: Not on file    Minutes per session: Not on file  . Stress: Not on file  Relationships  . Social connections:    Talks on phone: Not on file    Gets together: Not on file    Attends religious service: Not on file    Active member of club or organization: Not on file    Attends meetings of clubs or organizations: Not on file    Relationship status: Not on file  . Intimate partner violence:    Fear of current or ex partner: Not on file    Emotionally abused: Not on file    Physically abused: Not on file    Forced sexual activity: Not on file  Other Topics Concern  . Not on file  Social History Narrative  . Not on file    Review of Systems: See HPI, otherwise negative ROS  Physical Exam: BP 124/83    Pulse 75   Temp (!) 96.1 F (35.6 C) (Oral)   Resp 18   Ht 6\' 1"  (1.854 m)   Wt 99.8 kg   SpO2 99%   BMI 29.03 kg/m  General:   Alert,  pleasant and cooperative in NAD Head:  Normocephalic and atraumatic. Neck:  Supple; no masses or thyromegaly. Lungs:  Clear throughout to auscultation.    Heart:  Regular rate and rhythm. Abdomen:  Soft, nontender and nondistended. Normal bowel sounds, without guarding, and without rebound.   Neurologic:  Alert and  oriented x4;  grossly normal neurologically.  Impression/Plan: Leroy [name] is here for an endoscopy to be performed for dysphagia for many years.  Risks, benefits, limitations, and alternatives regarding  endoscopy have been reviewed with the patient.  Questions have been answered.  All parties agreeable.   Gaylyn Cheers, MD  11/09/2018, 1:18 PM

## 2018-11-09 NOTE — Anesthesia Post-op Follow-up Note (Signed)
Anesthesia QCDR form completed.        

## 2018-11-09 NOTE — Anesthesia Preprocedure Evaluation (Signed)
Anesthesia Evaluation  Patient identified by MRN, date of birth, ID band Patient awake    Reviewed: Allergy & Precautions, NPO status , Patient's Chart, lab work & pertinent test results  History of Anesthesia Complications Negative for: history of anesthetic complications  Airway Mallampati: III  TM Distance: >3 FB Neck ROM: Full    Dental  (+) Partial Lower, Partial Upper   Pulmonary neg sleep apnea, neg COPD,    breath sounds clear to auscultation- rhonchi (-) wheezing      Cardiovascular hypertension, Pt. on medications + Peripheral Vascular Disease  (-) CAD, (-) Past MI, (-) Cardiac Stents and (-) CABG  Rhythm:Regular Rate:Normal - Systolic murmurs and - Diastolic murmurs    Neuro/Psych PSYCHIATRIC DISORDERS Dementia CVA, Residual Symptoms    GI/Hepatic Neg liver ROS, hiatal hernia, PUD,   Endo/Other  negative endocrine ROSneg diabetes  Renal/GU Renal InsufficiencyRenal disease     Musculoskeletal negative musculoskeletal ROS (+)   Abdominal (+) + obese,   Peds  Hematology  (+) anemia ,   Anesthesia Other Findings Past Medical History: No date: Anemia No date: Bursitis No date: Cancer ([facility])     Comment:  skin cancer face No date: Chronic kidney disease No date: Dementia ([facility]) No date: Dyspnea No date: GI bleed No date: History of hiatal hernia No date: Hyperlipidemia No date: Hypertension No date: IVCD (intraventricular conduction defect) No date: Leg weakness, bilateral No date: Obesity No date: Peripheral vascular disease ([facility]) No date: Stomach ulcer No date: Stroke [facility])     Comment:  feb 2017, weakness in legs,  No date: Tremor No date: Vitamin B 12 deficiency No date: Vitamin D deficiency   Reproductive/Obstetrics                             Anesthesia Physical Anesthesia Plan  ASA: III  Anesthesia Plan: General   Post-op Pain Management:    Induction:  Intravenous  PONV Risk Score and Plan: 1 and Propofol infusion  Airway Management Planned: Natural Airway  Additional Equipment:   Intra-op Plan:   Post-operative Plan:   Informed Consent: I have reviewed the patients History and Physical, chart, labs and discussed the procedure including the risks, benefits and alternatives for the proposed anesthesia with the patient or authorized representative who has indicated his/her understanding and acceptance.   Dental advisory given  Plan Discussed with: CRNA and Anesthesiologist  Anesthesia Plan Comments:         Anesthesia Quick Evaluation

## 2018-11-10 ENCOUNTER — Encounter: Payer: Self-pay | Admitting: Unknown Physician Specialty

## 2018-12-02 DIAGNOSIS — K222 Esophageal obstruction: Secondary | ICD-10-CM | POA: Insufficient documentation

## 2020-01-09 ENCOUNTER — Ambulatory Visit
Admission: RE | Admit: 2020-01-09 | Discharge: 2020-01-09 | Disposition: A | Discharge status: Home / Self Care | Payer: Medicare HMO | Source: Ambulatory Visit | Attending: Physician Assistant | Admitting: Physician Assistant

## 2020-01-09 ENCOUNTER — Other Ambulatory Visit: Payer: Self-pay

## 2020-01-09 ENCOUNTER — Other Ambulatory Visit: Payer: Self-pay | Admitting: Physician Assistant

## 2020-01-09 DIAGNOSIS — M5441 Lumbago with sciatica, right side: Secondary | ICD-10-CM | POA: Insufficient documentation

## 2020-03-10 ENCOUNTER — Observation Stay (HOSPITAL_COMMUNITY)
Admission: EM | Admit: 2020-03-10 | Discharge: 2020-03-11 | Disposition: A | Discharge status: Home / Self Care | Payer: Medicare HMO | Attending: Internal Medicine | Admitting: Internal Medicine

## 2020-03-10 ENCOUNTER — Other Ambulatory Visit: Payer: Self-pay

## 2020-03-10 ENCOUNTER — Encounter (HOSPITAL_COMMUNITY): Payer: Self-pay

## 2020-03-10 ENCOUNTER — Emergency Department (HOSPITAL_COMMUNITY): Payer: Medicare HMO

## 2020-03-10 DIAGNOSIS — E559 Vitamin D deficiency, unspecified: Secondary | ICD-10-CM | POA: Diagnosis not present

## 2020-03-10 DIAGNOSIS — Z8673 Personal history of transient ischemic attack (TIA), and cerebral infarction without residual deficits: Secondary | ICD-10-CM | POA: Insufficient documentation

## 2020-03-10 DIAGNOSIS — E669 Obesity, unspecified: Secondary | ICD-10-CM | POA: Diagnosis not present

## 2020-03-10 DIAGNOSIS — G459 Transient cerebral ischemic attack, unspecified: Principal | ICD-10-CM | POA: Diagnosis present

## 2020-03-10 DIAGNOSIS — I10 Essential (primary) hypertension: Secondary | ICD-10-CM | POA: Diagnosis present

## 2020-03-10 DIAGNOSIS — I679 Cerebrovascular disease, unspecified: Secondary | ICD-10-CM

## 2020-03-10 DIAGNOSIS — E041 Nontoxic single thyroid nodule: Secondary | ICD-10-CM | POA: Insufficient documentation

## 2020-03-10 DIAGNOSIS — R2681 Unsteadiness on feet: Secondary | ICD-10-CM | POA: Diagnosis not present

## 2020-03-10 DIAGNOSIS — F0390 Unspecified dementia without behavioral disturbance: Secondary | ICD-10-CM | POA: Insufficient documentation

## 2020-03-10 DIAGNOSIS — Z8249 Family history of ischemic heart disease and other diseases of the circulatory system: Secondary | ICD-10-CM | POA: Diagnosis not present

## 2020-03-10 DIAGNOSIS — Z79899 Other long term (current) drug therapy: Secondary | ICD-10-CM | POA: Insufficient documentation

## 2020-03-10 DIAGNOSIS — I1310 Hypertensive heart and chronic kidney disease without heart failure, with stage 1 through stage 4 chronic kidney disease, or unspecified chronic kidney disease: Secondary | ICD-10-CM | POA: Diagnosis not present

## 2020-03-10 DIAGNOSIS — E785 Hyperlipidemia, unspecified: Secondary | ICD-10-CM | POA: Diagnosis present

## 2020-03-10 DIAGNOSIS — I739 Peripheral vascular disease, unspecified: Secondary | ICD-10-CM | POA: Diagnosis not present

## 2020-03-10 DIAGNOSIS — N1831 Chronic kidney disease, stage 3a: Secondary | ICD-10-CM | POA: Diagnosis not present

## 2020-03-10 DIAGNOSIS — D631 Anemia in chronic kidney disease: Secondary | ICD-10-CM | POA: Diagnosis not present

## 2020-03-10 DIAGNOSIS — Z20822 Contact with and (suspected) exposure to covid-19: Secondary | ICD-10-CM | POA: Diagnosis not present

## 2020-03-10 DIAGNOSIS — Z85828 Personal history of other malignant neoplasm of skin: Secondary | ICD-10-CM | POA: Insufficient documentation

## 2020-03-10 LAB — CBC
HCT: 38.4 % — ABNORMAL LOW (ref 39.0–52.0)
Hemoglobin: 12.7 g/dL — ABNORMAL LOW (ref 13.0–17.0)
MCH: 30.3 pg (ref 26.0–34.0)
MCHC: 33.1 g/dL (ref 30.0–36.0)
MCV: 91.6 fL (ref 80.0–100.0)
Platelets: 242 10*3/uL (ref 150–400)
RBC: 4.19 MIL/uL — ABNORMAL LOW (ref 4.22–5.81)
RDW: 12.9 % (ref 11.5–15.5)
WBC: 7.7 10*3/uL (ref 4.0–10.5)
nRBC: 0 % (ref 0.0–0.2)

## 2020-03-10 LAB — DIFFERENTIAL
Abs Immature Granulocytes: 0.02 10*3/uL (ref 0.00–0.07)
Basophils Absolute: 0.1 10*3/uL (ref 0.0–0.1)
Basophils Relative: 1 %
Eosinophils Absolute: 0.1 10*3/uL (ref 0.0–0.5)
Eosinophils Relative: 2 %
Immature Granulocytes: 0 %
Lymphocytes Relative: 30 %
Lymphs Abs: 2.3 10*3/uL (ref 0.7–4.0)
Monocytes Absolute: 0.6 10*3/uL (ref 0.1–1.0)
Monocytes Relative: 8 %
Neutro Abs: 4.6 10*3/uL (ref 1.7–7.7)
Neutrophils Relative %: 59 %

## 2020-03-10 LAB — COMPREHENSIVE METABOLIC PANEL
ALT: 15 U/L (ref 0–44)
AST: 21 U/L (ref 15–41)
Albumin: 3.4 g/dL — ABNORMAL LOW (ref 3.5–5.0)
Alkaline Phosphatase: 73 U/L (ref 38–126)
Anion gap: 10 (ref 5–15)
BUN: 18 mg/dL (ref 8–23)
CO2: 25 mmol/L (ref 22–32)
Calcium: 8.9 mg/dL (ref 8.9–10.3)
Chloride: 107 mmol/L (ref 98–111)
Creatinine, Ser: 1.36 mg/dL — ABNORMAL HIGH (ref 0.61–1.24)
GFR calc Af Amer: 54 mL/min — ABNORMAL LOW (ref >60)
GFR calc non Af Amer: 46 mL/min — ABNORMAL LOW (ref >60)
Glucose, Bld: 97 mg/dL (ref 70–99)
Potassium: 4.2 mmol/L (ref 3.5–5.1)
Sodium: 142 mmol/L (ref 135–145)
Total Bilirubin: 1 mg/dL (ref 0.3–1.2)
Total Protein: 6.8 g/dL (ref 6.5–8.1)

## 2020-03-10 LAB — PROTIME-INR
INR: 1 (ref 0.8–1.2)
Prothrombin Time: 12.5 seconds (ref 11.4–15.2)

## 2020-03-10 LAB — I-STAT CHEM 8, ED
BUN: 21 mg/dL (ref 8–23)
Calcium, Ion: 1.19 mmol/L (ref 1.15–1.40)
Chloride: 103 mmol/L (ref 98–111)
Creatinine, Ser: 1.4 mg/dL — ABNORMAL HIGH (ref 0.61–1.24)
Glucose, Bld: 94 mg/dL (ref 70–99)
HCT: 36 % — ABNORMAL LOW (ref 39.0–52.0)
Hemoglobin: 12.2 g/dL — ABNORMAL LOW (ref 13.0–17.0)
Potassium: 3.7 mmol/L (ref 3.5–5.1)
Sodium: 140 mmol/L (ref 135–145)
TCO2: 28 mmol/L (ref 22–32)

## 2020-03-10 LAB — URINALYSIS, ROUTINE W REFLEX MICROSCOPIC
Bacteria, UA: NONE SEEN
Bilirubin Urine: NEGATIVE
Glucose, UA: NEGATIVE mg/dL
Ketones, ur: NEGATIVE mg/dL
Leukocytes,Ua: NEGATIVE
Nitrite: NEGATIVE
Protein, ur: NEGATIVE mg/dL
Specific Gravity, Urine: 1.009 (ref 1.005–1.030)
pH: 6 (ref 5.0–8.0)

## 2020-03-10 LAB — SARS CORONAVIRUS 2 (TAT 6-24 HRS): SARS Coronavirus 2: NEGATIVE

## 2020-03-10 LAB — APTT: aPTT: 37 seconds — ABNORMAL HIGH (ref 24–36)

## 2020-03-10 LAB — ETHANOL: Alcohol, Ethyl (B): <10 mg/dL (ref <10)

## 2020-03-10 MED ORDER — ACETAMINOPHEN 160 MG/5ML PO SOLN: 650.0000 mg | ORAL | Status: DC | PRN

## 2020-03-10 MED ORDER — HEPARIN SODIUM (PORCINE) 5000 UNIT/ML IJ SOLN
5000.0000 [IU] | Freq: Three times a day (TID) | INTRAMUSCULAR | Status: DC
  Administered 2020-03-10 – 2020-03-11 (×3): 5000 [IU] via SUBCUTANEOUS
  Filled 2020-03-10 (×3): qty 1

## 2020-03-10 MED ORDER — SENNOSIDES-DOCUSATE SODIUM 8.6-50 MG PO TABS: 1.0000 | ORAL_TABLET | Freq: Every evening | ORAL | Status: DC | PRN

## 2020-03-10 MED ORDER — HYDRALAZINE HCL 20 MG/ML IJ SOLN: 5.0000 mg | INTRAMUSCULAR | Status: DC | PRN

## 2020-03-10 MED ORDER — ACETAMINOPHEN 325 MG PO TABS: 650.0000 mg | ORAL_TABLET | ORAL | Status: DC | PRN

## 2020-03-10 MED ORDER — STROKE: EARLY STAGES OF RECOVERY BOOK
Freq: Once | Status: DC
  Filled 2020-03-10: qty 1

## 2020-03-10 MED ORDER — ATORVASTATIN CALCIUM 40 MG PO TABS
40.0000 mg | ORAL_TABLET | Freq: Every day | ORAL | Status: DC
  Administered 2020-03-10: 40 mg via ORAL
  Filled 2020-03-10: qty 1

## 2020-03-10 MED ORDER — SODIUM CHLORIDE 0.9 % IV SOLN: INTRAVENOUS | Status: DC

## 2020-03-10 MED ORDER — ACETAMINOPHEN 650 MG RE SUPP: 650.0000 mg | RECTAL | Status: DC | PRN

## 2020-03-10 MED ORDER — IOHEXOL 350 MG/ML SOLN
50.0000 mL | Freq: Once | INTRAVENOUS | Status: AC | PRN
  Administered 2020-03-10: 50 mL via INTRAVENOUS

## 2020-03-10 NOTE — H&P (Signed)
History and Physical    Leroy [name] [id number] DOB: Oct 16, 1930 DOA: 03/10/2020  PCP: Idelle Crouch, MD   Patient coming from: Home  I have personally briefly reviewed patient's old medical records in [place]  Chief Complaint: Blurred vision  HPI: Leroy [name] is a 86 y.o. male with medical history significant of dementia, ICH in 2017, hypertension, hyperlipidemia, chronic kidney disease stage IIIa presented with complaints of blurred vision that happened an hour prior to arrival to the ED.  Patient states that he had a brief, 1 minute episode of blurry vision in both of his eyes this morning.  After he woke up this morning, he went to feed his dog, clean the house and while he was reading the newspaper, he started experiencing blurry vision.  He apparently tried to look out of the window and everything was blurry; try to wipe his eyes and attempt to help with the blurry vision but it did not help.  He complained of some dizziness at the time.  His symptoms completely resolved after a minute.  He told this to his family and he was brought to the ED.  On 03/06/2020, he had an episode of dizziness when he was walking out of church.  He was dizzy for about a minute when he was walking down the stairs but improved without intervention.  He did not have any blurry vision at the time.  He denies any current numbness in arms or legs, weakness in extremities, headache, vomiting, nausea, chest pain, shortness of breath, abdominal pain, loss of consciousness, seizures, head injury or falls.  ED Course: CT of the head was negative for acute stroke.  MRI was also negative for stroke.  CTA of the head and neck showed several areas of high-grade stenosis.  Neurology recommended observation under hospitalist service with neurology evaluation. Hospitalist service was called to evaluate the patient.  Review of Systems: Full review of systems could not be obtained because patient is a poor  historian because of his dementia.  Past Medical History:  Diagnosis Date  . Anemia   . Bursitis   . Cancer ([place])    skin cancer face  . Chronic kidney disease   . Dementia ([place])   . Dyspnea   . GI bleed   . History of hiatal hernia   . Hyperlipidemia   . Hypertension   . IVCD (intraventricular conduction defect)   . Leg weakness, bilateral   . Obesity   . Peripheral vascular disease ([place])   . Stomach ulcer   . Stroke [facility])    feb 2017, weakness in legs,   . Tremor   . Vitamin B 12 deficiency   . Vitamin D deficiency     Past Surgical History:  Procedure Laterality Date  . BACK SURGERY    . ESOPHAGOGASTRODUODENOSCOPY (EGD) WITH PROPOFOL N/A 11/09/2018   Procedure: ESOPHAGOGASTRODUODENOSCOPY (EGD) WITH PROPOFOL;  Surgeon: Manya Silvas, MD;  Location: [facility] ENDOSCOPY;  Service: Endoscopy;  Laterality: N/A;  . EYE SURGERY      bilateral cataracts  . HERNIA REPAIR     inguinal on left  . INGUINAL HERNIA REPAIR Right 12/29/2017   Procedure: HERNIA REPAIR INGUINAL ADULT;  Surgeon: Herbert Pun, MD;  Location: [facility] ORS;  Service: General;  Laterality: Right;  . INSERTION OF MESH Right 12/29/2017   Procedure: INSERTION OF MESH;  Surgeon: Herbert Pun, MD;  Location: [facility] ORS;  Service: General;  Laterality: Right;  . LUMBAR LAMINECTOMY/DECOMPRESSION MICRODISCECTOMY N/A  08/12/2016   Procedure: LAMINECTOMY AND FORAMINOTOMY LUMBAR FOUR - LUMBAR FIVE;  Surgeon: Newman Pies, MD;  Location: [place];  Service: Neurosurgery;  Laterality: N/A;  LAMINECTOMY AND FORAMINOTOMY L4-L5   Social history  reports that he has never smoked. His smokeless tobacco use includes chew. He reports that he does not drink alcohol or use drugs.  Allergies  Allergen Reactions  . Aspirin Other (See Comments)    He has bleeding ulcers    Family History  Problem Relation Age of Onset  . CAD Mother   . Cancer Father     Prior to Admission medications   Medication Sig Start Date  End Date Taking? Authorizing Provider  acetaminophen (TYLENOL) 500 MG tablet Take 500 mg by mouth daily as needed for moderate pain or headache.   Yes [provider]  atorvastatin (LIPITOR) 40 MG tablet Take 1 tablet (40 mg total) by mouth daily. Patient taking differently: Take 20 mg by mouth every evening.  12/14/15  Yes Rinehuls, Early Chars, PA-C  cholecalciferol (VITAMIN D) 1000 units tablet Take 2,000 Units by mouth daily.    Yes [provider]  metoprolol succinate (TOPROL-XL) 25 MG 24 hr tablet Take 12.5 mg by mouth in the morning.  02/27/20 02/26/21 Yes [provider]    Physical Exam: Vitals:   03/10/20 1158 03/10/20 1242  BP: (!) 209/115 (!) 180/88  Pulse:  (!) 58  Resp: 13 12  Temp: 98.7 F (37.1 C)   TempSrc: Oral   SpO2: 98% 99%    Constitutional: NAD, calm, comfortable.  Elderly male lying in bed.  Poor historian. Vitals:   03/10/20 1158 03/10/20 1242  BP: (!) 209/115 (!) 180/88  Pulse:  (!) 58  Resp: 13 12  Temp: 98.7 F (37.1 C)   TempSrc: Oral   SpO2: 98% 99%   Eyes: PERRL, lids and conjunctivae normal ENMT: Mucous membranes are moist. Posterior pharynx clear of any exudate or lesions. Neck: normal, supple, no masses, no thyromegaly Respiratory: bilateral decreased breath sounds at bases, no wheezing, no crackles. Normal respiratory effort. No accessory muscle use.  Cardiovascular: S1 S2 positive, intermittently bradycardic. No extremity edema. 2+ pedal pulses.  Abdomen: no tenderness, no masses palpated. No hepatosplenomegaly. Bowel sounds positive.  Musculoskeletal: no clubbing / cyanosis. No joint deformity upper and lower extremities.  Skin: no rashes, lesions, ulcers. No induration Neurologic: Poor historian.  Slightly confused to time.  CN 2-12 grossly intact. Moving extremities.  Strength is 5 of 5 in all extremities no focal neurologic deficits.  Psychiatric: Could not be properly assessed because of patient being slightly  confused   Labs on Admission: I have personally reviewed following labs and imaging studies  CBC: Recent Labs  Lab 03/10/20 1246 03/10/20 1257  WBC 7.7  --   NEUTROABS 4.6  --   HGB 12.7* 12.2*  HCT 38.4* 36.0*  MCV 91.6  --   PLT 242  --    Basic Metabolic Panel: Recent Labs  Lab 03/10/20 1246 03/10/20 1257  NA 142 140  K 4.2 3.7  CL 107 103  CO2 25  --   GLUCOSE 97 94  BUN 18 21  CREATININE 1.36* 1.40*  CALCIUM 8.9  --    GFR: CrCl cannot be calculated (Unknown ideal weight.). Liver Function Tests: Recent Labs  Lab 03/10/20 1246  AST 21  ALT 15  ALKPHOS 73  BILITOT 1.0  PROT 6.8  ALBUMIN 3.4*   No results for input(s): LIPASE, AMYLASE in  the last 168 hours. No results for input(s): AMMONIA in the last 168 hours. Coagulation Profile: Recent Labs  Lab 03/10/20 1246  INR 1.0   Cardiac Enzymes: No results for input(s): CKTOTAL, CKMB, CKMBINDEX, TROPONINI in the last 168 hours. BNP (last 3 results) No results for input(s): PROBNP in the last 8760 hours. HbA1C: No results for input(s): HGBA1C in the last 72 hours. CBG: No results for input(s): GLUCAP in the last 168 hours. Lipid Profile: No results for input(s): CHOL, HDL, LDLCALC, TRIG, CHOLHDL, LDLDIRECT in the last 72 hours. Thyroid Function Tests: No results for input(s): TSH, T4TOTAL, FREET4, T3FREE, THYROIDAB in the last 72 hours. Anemia Panel: No results for input(s): VITAMINB12, FOLATE, FERRITIN, TIBC, IRON, RETICCTPCT in the last 72 hours. Urine analysis:    Component Value Date/Time   COLORURINE STRAW (A) 03/10/2020 1250   APPEARANCEUR CLEAR 03/10/2020 1250   LABSPEC 1.009 03/10/2020 1250   PHURINE 6.0 03/10/2020 1250   GLUCOSEU NEGATIVE 03/10/2020 1250   HGBUR SMALL (A) 03/10/2020 1250   BILIRUBINUR NEGATIVE 03/10/2020 1250   KETONESUR NEGATIVE 03/10/2020 1250   PROTEINUR NEGATIVE 03/10/2020 1250   NITRITE NEGATIVE 03/10/2020 1250   LEUKOCYTESUR NEGATIVE 03/10/2020 1250     Radiological Exams on Admission: CT Angio Head W or Wo Contrast  Result Date: 03/10/2020 CLINICAL DATA:  Transient dizziness and blurry vision. Additional provided: Transient dizziness and blurred vision since Wednesday. EXAM: CT ANGIOGRAPHY HEAD AND NECK TECHNIQUE: Multidetector CT imaging of the head and neck was performed using the standard protocol during bolus administration of intravenous contrast. Multiplanar CT image reconstructions and MIPs were obtained to evaluate the vascular anatomy. Carotid stenosis measurements (when applicable) are obtained utilizing NASCET criteria, using the distal internal carotid diameter as the denominator. CONTRAST:  57mL OMNIPAQUE IOHEXOL 350 MG/ML SOLN COMPARISON:  Brain MRI and head CT performed earlier the same day 03/10/2020 FINDINGS: CTA NECK FINDINGS Aortic arch: Standard aortic branching. Atherosclerotic plaque within the visualized aortic arch and proximal major branch vessels of the neck. No hemodynamically significant innominate or proximal subclavian artery stenosis. Right carotid system: CCA and ICA patent within the neck without significant stenosis (50% or greater). Mild calcified plaque within the CCA, carotid bifurcation and proximal ICA. Left carotid system: CCA and ICA patent within the neck without significant stenosis (50% or greater). Mild to moderate mixed plaque within the distal CCA, carotid bifurcation and proximal ICA. Vertebral arteries: Mixed plaque at the origin the right vertebral artery. There is little, if any, opacification of the V1 and V2 right vertebral artery to the C2 level. There is reconstitution at the C2 and more cranial levels. The dominant left vertebral artery is patent throughout the neck without significant stenosis. Minimal calcified plaque at the origin of this vessel. Skeleton: No acute bony abnormality or aggressive osseous lesion. Cervical spondylosis with multilevel disc degeneration, posterior disc osteophytes,  uncovertebral and facet hypertrophy. Prominent multilevel cervical anterior osteophytes, most notably at C3-C4. Multilevel degenerative osseous fusion Other neck: No neck mass or cervical lymphadenopathy. Subcentimeter right thyroid lobe nodule not meeting consensus criteria for ultrasound follow-up. Upper chest: No consolidation within the imaged lung apices. Review of the MIP images confirms the above findings CTA HEAD FINDINGS Anterior circulation: The intracranial internal carotid arteries are patent. Mild calcified plaque within these vessels without significant stenosis. The M1 middle cerebral arteries are patent without significant stenosis. No M2 proximal branch occlusion is identified. Moderate focal stenosis within a proximal to mid M2 left MCA branch vessel (series 12, image  24). The anterior cerebral arteries are patent without significant proximal stenosis. There is a 1-2 mm inferiorly projecting vascular protrusion arising from the supraclinoid right ICA which may reflect an infundibulum or tiny aneurysm (series 9, image 75). Posterior circulation: Multifocal high-grade stenoses within the V4 right vertebral artery. The proximal right PICA is poorly delineated suggestive of high-grade stenosis within this vessel. The intracranial left vertebral artery is patent without significant stenosis, as is the basilar artery. The posterior cerebral arteries are patent bilaterally without significant proximal stenosis. Atherosclerotic irregularity of the P3 and more distal posterior cerebral arteries. Posterior communicating arteries are poorly delineated and may be hypoplastic or absent bilaterally. Venous sinuses: Within limitations of contrast timing, no convincing thrombus. Anatomic variants: As described. Review of the MIP images confirms the above findings IMPRESSION: CTA neck: 1. There is little, if any, opacification of the right vertebral artery to the C2 level. Findings may be secondary to  atherosclerotic disease. Dissection cannot be excluded. There is reconstitution of flow at the C2 and more cranial levels. Multifocal high-grade stenoses within the V4 right vertebral artery. Additionally, the proximal right PICA is poorly delineated suggestive of high-grade stenosis. 2. The bilateral common and internal carotid arteries are patent within the neck without hemodynamically significant stenosis. Atherosclerotic plaque within the carotid systems as described. CTA head: 1. Multifocal high-grade stenoses within the V4 right vertebral artery and suspected high-grade stenosis of the proximal right PICA as described above. 2. Moderate focal stenosis within a proximal-to-mid M2 left MCA branch vessel. 3. No other significant proximal intracranial arterial stenosis is identified. 4. 1-2 mm infundibulum versus tiny aneurysm arising from the supraclinoid right ICA. Electronically Signed   By: Kellie Simmering DO   On: 03/10/2020 16:24   CT Head Wo Contrast  Result Date: 03/10/2020 CLINICAL DATA:  Blurry vision, history of hemorrhage. Additional history provided: Blurred vision and dizziness onset this morning. EXAM: CT HEAD WITHOUT CONTRAST TECHNIQUE: Contiguous axial images were obtained from the base of the skull through the vertex without intravenous contrast. COMPARISON:  Brain MRI 09/01/2017, head CT 01/06/2016 FINDINGS: Brain: Ill-defined hypoattenuation within the cerebral white matter is nonspecific, but consistent with chronic small vessel ischemic disease. Known small chronic lacunar infarct within the left thalamus/internal capsule better appreciated on prior MRI 09/01/2017. Stable, moderate generalized parenchymal atrophy. There is no acute intracranial hemorrhage. No demarcated cortical infarct. No extra-axial fluid collection. No evidence of intracranial mass. No midline shift. Vascular: No hyperdense vessel.  Atherosclerotic calcifications Skull: Normal. Negative for fracture or focal lesion.  Sinuses/Orbits: Visualized orbits show no acute finding. Mild ethmoid sinus mucosal thickening. No significant mastoid effusion. IMPRESSION: 1. No evidence of acute intracranial abnormality. 2. Moderate generalized parenchymal atrophy with chronic small vessel ischemic disease. Known remote left thalamic/internal capsule lacunar infarct. 3. Mild ethmoid sinus mucosal thickening. Electronically Signed   By: Kellie Simmering DO   On: 03/10/2020 12:55   CT Angio Neck W and/or Wo Contrast  Result Date: 03/10/2020 CLINICAL DATA:  Transient dizziness and blurry vision. Additional provided: Transient dizziness and blurred vision since Wednesday. EXAM: CT ANGIOGRAPHY HEAD AND NECK TECHNIQUE: Multidetector CT imaging of the head and neck was performed using the standard protocol during bolus administration of intravenous contrast. Multiplanar CT image reconstructions and MIPs were obtained to evaluate the vascular anatomy. Carotid stenosis measurements (when applicable) are obtained utilizing NASCET criteria, using the distal internal carotid diameter as the denominator. CONTRAST:  37mL OMNIPAQUE IOHEXOL 350 MG/ML SOLN COMPARISON:  Brain MRI and head  CT performed earlier the same day 03/10/2020 FINDINGS: CTA NECK FINDINGS Aortic arch: Standard aortic branching. Atherosclerotic plaque within the visualized aortic arch and proximal major branch vessels of the neck. No hemodynamically significant innominate or proximal subclavian artery stenosis. Right carotid system: CCA and ICA patent within the neck without significant stenosis (50% or greater). Mild calcified plaque within the CCA, carotid bifurcation and proximal ICA. Left carotid system: CCA and ICA patent within the neck without significant stenosis (50% or greater). Mild to moderate mixed plaque within the distal CCA, carotid bifurcation and proximal ICA. Vertebral arteries: Mixed plaque at the origin the right vertebral artery. There is little, if any, opacification of  the V1 and V2 right vertebral artery to the C2 level. There is reconstitution at the C2 and more cranial levels. The dominant left vertebral artery is patent throughout the neck without significant stenosis. Minimal calcified plaque at the origin of this vessel. Skeleton: No acute bony abnormality or aggressive osseous lesion. Cervical spondylosis with multilevel disc degeneration, posterior disc osteophytes, uncovertebral and facet hypertrophy. Prominent multilevel cervical anterior osteophytes, most notably at C3-C4. Multilevel degenerative osseous fusion Other neck: No neck mass or cervical lymphadenopathy. Subcentimeter right thyroid lobe nodule not meeting consensus criteria for ultrasound follow-up. Upper chest: No consolidation within the imaged lung apices. Review of the MIP images confirms the above findings CTA HEAD FINDINGS Anterior circulation: The intracranial internal carotid arteries are patent. Mild calcified plaque within these vessels without significant stenosis. The M1 middle cerebral arteries are patent without significant stenosis. No M2 proximal branch occlusion is identified. Moderate focal stenosis within a proximal to mid M2 left MCA branch vessel (series 12, image 24). The anterior cerebral arteries are patent without significant proximal stenosis. There is a 1-2 mm inferiorly projecting vascular protrusion arising from the supraclinoid right ICA which may reflect an infundibulum or tiny aneurysm (series 9, image 75). Posterior circulation: Multifocal high-grade stenoses within the V4 right vertebral artery. The proximal right PICA is poorly delineated suggestive of high-grade stenosis within this vessel. The intracranial left vertebral artery is patent without significant stenosis, as is the basilar artery. The posterior cerebral arteries are patent bilaterally without significant proximal stenosis. Atherosclerotic irregularity of the P3 and more distal posterior cerebral arteries.  Posterior communicating arteries are poorly delineated and may be hypoplastic or absent bilaterally. Venous sinuses: Within limitations of contrast timing, no convincing thrombus. Anatomic variants: As described. Review of the MIP images confirms the above findings IMPRESSION: CTA neck: 1. There is little, if any, opacification of the right vertebral artery to the C2 level. Findings may be secondary to atherosclerotic disease. Dissection cannot be excluded. There is reconstitution of flow at the C2 and more cranial levels. Multifocal high-grade stenoses within the V4 right vertebral artery. Additionally, the proximal right PICA is poorly delineated suggestive of high-grade stenosis. 2. The bilateral common and internal carotid arteries are patent within the neck without hemodynamically significant stenosis. Atherosclerotic plaque within the carotid systems as described. CTA head: 1. Multifocal high-grade stenoses within the V4 right vertebral artery and suspected high-grade stenosis of the proximal right PICA as described above. 2. Moderate focal stenosis within a proximal-to-mid M2 left MCA branch vessel. 3. No other significant proximal intracranial arterial stenosis is identified. 4. 1-2 mm infundibulum versus tiny aneurysm arising from the supraclinoid right ICA. Electronically Signed   By: Kellie Simmering DO   On: 03/10/2020 16:24   MR BRAIN WO CONTRAST  Result Date: 03/10/2020 CLINICAL DATA:  Transient dizziness and  blurry vision. EXAM: MRI HEAD WITHOUT CONTRAST TECHNIQUE: Multiplanar, multiecho pulse sequences of the brain and surrounding structures were obtained without intravenous contrast. COMPARISON:  Head CT 03/10/2020, brain MRI 09/01/2017 FINDINGS: Brain: Moderate scattered T2/FLAIR hyperintensity within the cerebral white matter is nonspecific, but consistent with chronic small vessel ischemic disease. Findings have not significantly changed since MRI 09/01/2017. Redemonstrated chronic lacunar  infarcts within the left thalamocapsular junction and right cerebellar hemisphere. Multiple supratentorial and infratentorial chronic microhemorrhages within the cerebral hemispheres, basal ganglia, left thalamus and cerebellum. Stable, moderate generalized parenchymal atrophy. There is no acute infarct. No evidence of intracranial mass. No extra-axial fluid collection. No midline shift. Vascular: Loss of the expected flow void within portions of the V4 right vertebral artery (series 10, image 1). Skull and upper cervical spine: No focal marrow lesion. Sinuses/Orbits: Bilateral lens replacements. Small right maxillary sinus mucous retention cyst. Mild ethmoid sinus mucosal thickening. IMPRESSION: 1. No evidence of acute infarct. 2. Signal abnormality within portions of the V4 right vertebral artery suggestive of high-grade stenosis or occlusion. Consider CTA or MRA for further evaluation. 3. Stable moderate generalized parenchymal and chronic small vessel ischemic disease. 4. Redemonstrated chronic lacunar infarcts within the left thalamocapsular junction and right cerebellum. 5. 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 Electronically Signed   By: Kellie Simmering DO   On: 03/10/2020 15:12    EKG: Independently reviewed.  Sinus rhythm with no ST elevations or depressions.  Assessment/Plan  Probable TIA causing blurred vision and vertigo -CT of the brain and MRI of the brain did not show any evidence of acute infarct. -CTA of the head and neck showed multifocal high-grade stenosis within the V4 right vertebral artery along with probable proximal right PICA high-grade stenosis -Neurology evaluation -PT/OT/SLP evaluation.  Fall precautions. -2D echo.  Hemoglobin A1c.  Lipid profile. -Continue Lipitor. -Allow for permissive hypertension for now.  Hypertension -Blood pressure elevated.  Allow  for permissive hypertension for now.  Use IV antihypertensives if systolic blood pressure above [id number] and diastolic blood pressure [id number]  Hyperlipidemia  -continue Lipitor  Chronic any disease stage IIIa -Creatinine at baseline.  Monitor  Probable anemia of chronic disease -From chronic renal disease.  Hemoglobin stable.  Monitor  Dementia -Monitor mental status.  Fall precaution.  Delirium precautions   DVT prophylaxis: Heparin subcutaneous Code Status: Full Family Communication: Spoke to wife at bedside and daughter on phone Disposition Plan: Home in 1 to 2 days once clinically improved and cleared by neurology Consults called: Neurology by ED provider Admission status: Observation/telemetry  Severity of Illness: The appropriate patient status for this patient is OBSERVATION. Observation status is judged to be reasonable and necessary in order to provide the required intensity of service to ensure the patient's safety. The patient's presenting symptoms, physical exam findings, and initial radiographic and laboratory data in the context of their medical condition is felt to place them at decreased risk for further clinical deterioration. Furthermore, it is anticipated that the patient will be medically stable for discharge from the hospital within 2 midnights of admission. The following factors support the patient status of observation.   " The patient's presenting symptoms include blurred vision. " The physical exam findings include slightly confused elderly male with elevated blood pressure. " The initial radiographic and laboratory data are multifocal high-grade stenosis within the V4 right vertebral artery.      Aline August MD [facility]  03/10/2020, 5:40 PM

## 2020-03-10 NOTE — ED Provider Notes (Signed)
[facility] EMERGENCY DEPARTMENT Provider Note   CSN: [id number] Arrival date & time: 03/10/20  1152     History Chief Complaint  Patient presents with   Blurred Vision    Leroy [name] is a 86 y.o. male with a past medical history of dementia, history of ICH in 2017, hypertension, hyperlipidemia presenting to the ED with a chief complaint of blurry vision.  Just prior to arrival patient states that he had a brief, 1 minute episode of blurry vision in both of his eyes.  States that he woke up this morning in his usual state of health.  He went to feed his dog, clean the house and while he was reading the newspaper started experiencing blurry vision.  States that "when I tried to look at the windows, everything was blurry."  He tried to wipe his eyes and attempt to help with the blurry vision but it did not help.  He did endorse some vague dizziness at the time.  His symptoms completely resolved without intervention after a minute.  He told his wife about what happened and she instead called his grandson who works for the [employer], who called for someone to evaluate him. Patient states that last week on 03/06/2020, had an episode of dizziness when he was walking out of church.  States that he was walking down the stairs when he felt dizzy.  This episode lasted for about 1 minute and improved without intervention.  He did not have blurry vision at the time. He denies any current numbness in arms or legs, headache, vision changes, vomiting, chest pain, abdominal pain, fever or neck stiffness, injuries or falls.  HPI     Past Medical History:  Diagnosis Date   Anemia    Bursitis    Cancer ([place])    skin cancer face   Chronic kidney disease    Dementia ([place])    Dyspnea    GI bleed    History of hiatal hernia    Hyperlipidemia    Hypertension    IVCD (intraventricular conduction defect)    Leg weakness, bilateral    Obesity    Peripheral vascular  disease ([place])    Stomach ulcer    Stroke ([place])    feb 2017, weakness in legs,    Tremor    Vitamin B 12 deficiency    Vitamin D deficiency     Patient Active Problem List   Diagnosis Date Noted   Diarrhea 04/20/2017   Lumbar stenosis with neurogenic claudication 08/12/2016   Hypertensive emergency 12/13/2015   Nontraumatic intracerebral hemorrhage ([place]) 12/12/2015   Acute intracerebral hemorrhage ([place]) 12/12/2015    Past Surgical History:  Procedure Laterality Date   BACK SURGERY     ESOPHAGOGASTRODUODENOSCOPY (EGD) WITH PROPOFOL N/A 11/09/2018   Procedure: ESOPHAGOGASTRODUODENOSCOPY (EGD) WITH PROPOFOL;  Surgeon: Manya Silvas, MD;  Location: [facility] ENDOSCOPY;  Service: Endoscopy;  Laterality: N/A;   EYE SURGERY      bilateral cataracts   HERNIA REPAIR     inguinal on left   INGUINAL HERNIA REPAIR Right 12/29/2017   Procedure: HERNIA REPAIR INGUINAL ADULT;  Surgeon: Herbert Pun, MD;  Location: [facility] ORS;  Service: General;  Laterality: Right;   INSERTION OF MESH Right 12/29/2017   Procedure: INSERTION OF MESH;  Surgeon: Herbert Pun, MD;  Location: [facility] ORS;  Service: General;  Laterality: Right;   LUMBAR LAMINECTOMY/DECOMPRESSION MICRODISCECTOMY N/A 08/12/2016   Procedure: LAMINECTOMY AND FORAMINOTOMY LUMBAR FOUR - LUMBAR FIVE;  Surgeon: Newman Pies, MD;  Location: [facility];  Service: Neurosurgery;  Laterality: N/A;  LAMINECTOMY AND FORAMINOTOMY L4-L5       Family History  Problem Relation Age of Onset   CAD Mother    Cancer Father     Social History   Tobacco Use   Smoking status: Never Smoker   Smokeless tobacco: Current User    Types: Chew  Substance Use Topics   Alcohol use: No   Drug use: No    Home Medications Prior to Admission medications   Medication Sig Start Date End Date Taking? Authorizing Provider  acetaminophen (TYLENOL) 500 MG tablet Take 500 mg by mouth daily as needed for moderate pain or headache.    Yes [provider]  atorvastatin (LIPITOR) 40 MG tablet Take 1 tablet (40 mg total) by mouth daily. Patient taking differently: Take 20 mg by mouth every evening.  12/14/15  Yes Rinehuls, Early Chars, PA-C  cholecalciferol (VITAMIN D) 1000 units tablet Take 2,000 Units by mouth daily.    Yes [provider]  metoprolol succinate (TOPROL-XL) 25 MG 24 hr tablet Take 12.5 mg by mouth in the morning.  02/27/20 02/26/21 Yes [provider]    Allergies    Aspirin  Review of Systems   Review of Systems  Constitutional: Negative for appetite change, chills and fever.  HENT: Negative for ear pain, rhinorrhea, sneezing and sore throat.   Eyes: Positive for visual disturbance (resolved). Negative for photophobia.  Respiratory: Negative for cough, chest tightness, shortness of breath and wheezing.   Cardiovascular: Negative for chest pain and palpitations.  Gastrointestinal: Negative for abdominal pain, blood in stool, constipation, diarrhea, nausea and vomiting.  Genitourinary: Negative for dysuria, hematuria and urgency.  Musculoskeletal: Negative for myalgias.  Skin: Negative for rash.  Neurological: Positive for dizziness (resolved). Negative for weakness and light-headedness.    Physical Exam Updated Vital Signs BP (!) 180/88    Pulse (!) 58    Temp 98.7 F (37.1 C) (Oral)    Resp 12    SpO2 99%   Physical Exam Vitals and nursing note reviewed.  Constitutional:      General: He is not in acute distress.    Appearance: He is well-developed.  HENT:     Head: Normocephalic and atraumatic.     Nose: Nose normal.  Eyes:     General: No scleral icterus.       Right eye: No discharge.        Left eye: No discharge.     Conjunctiva/sclera: Conjunctivae normal.     Pupils: Pupils are equal, round, and reactive to light.  Cardiovascular:     Rate and Rhythm: Normal rate and regular rhythm.     Heart sounds: Normal heart sounds. No murmur. No friction rub. No gallop.    Pulmonary:     Effort: Pulmonary effort is normal. No respiratory distress.     Breath sounds: Normal breath sounds.  Abdominal:     General: Bowel sounds are normal. There is no distension.     Palpations: Abdomen is soft.     Tenderness: There is no abdominal tenderness. There is no guarding.  Musculoskeletal:        General: Normal range of motion.     Cervical back: Normal range of motion and neck supple.  Skin:    General: Skin is warm and dry.     Findings: No rash.  Neurological:     General: No focal deficit present.  Mental Status: He is alert. Mental status is at baseline.     Cranial Nerves: No cranial nerve deficit.     Sensory: No sensory deficit.     Motor: No weakness or abnormal muscle tone.     Coordination: Coordination normal.     Comments: Pupils reactive. No facial asymmetry noted. Cranial nerves appear grossly intact. Sensation intact to light touch on face, BUE and BLE. Strength 5/5 in BUE and BLE. Normal finger to nose coordination bilaterally. Patient alert, oriented to self, family members.  Unsure of time.  Knows he is in [place].     ED Results / Procedures / Treatments   Labs (all labs ordered are listed, but only abnormal results are displayed) Labs Reviewed  APTT - Abnormal; Notable for the following components:      Result Value   aPTT 37 (*)    All other components within normal limits  CBC - Abnormal; Notable for the following components:   RBC 4.19 (*)    Hemoglobin 12.7 (*)    HCT 38.4 (*)    All other components within normal limits  COMPREHENSIVE METABOLIC PANEL - Abnormal; Notable for the following components:   Creatinine, Ser 1.36 (*)    Albumin 3.4 (*)    GFR calc non Af Amer 46 (*)    GFR calc Af Amer 54 (*)    All other components within normal limits  URINALYSIS, ROUTINE W REFLEX MICROSCOPIC - Abnormal; Notable for the following components:   Color, Urine STRAW (*)    Hgb urine dipstick SMALL (*)    All other  components within normal limits  I-STAT CHEM 8, ED - Abnormal; Notable for the following components:   Creatinine, Ser 1.40 (*)    Hemoglobin 12.2 (*)    HCT 36.0 (*)    All other components within normal limits  ETHANOL  PROTIME-INR  DIFFERENTIAL    EKG EKG Interpretation  Date/Time:  Sunday Mar 10 2020 11:59:52 EDT Ventricular Rate:  60 PR Interval:    QRS Duration: 142 QT Interval:  450 QTC Calculation: 450 R Axis:   -48 Text Interpretation: Sinus rhythm Borderline prolonged PR interval Left bundle branch block Confirmed by Gerlene Fee [phone]) on 03/10/2020 12:46:13 PM   Radiology CT Angio Head W or Wo Contrast  Result Date: 03/10/2020 CLINICAL DATA:  Transient dizziness and blurry vision. Additional provided: Transient dizziness and blurred vision since Wednesday. EXAM: CT ANGIOGRAPHY HEAD AND NECK TECHNIQUE: Multidetector CT imaging of the head and neck was performed using the standard protocol during bolus administration of intravenous contrast. Multiplanar CT image reconstructions and MIPs were obtained to evaluate the vascular anatomy. Carotid stenosis measurements (when applicable) are obtained utilizing NASCET criteria, using the distal internal carotid diameter as the denominator. CONTRAST:  45mL OMNIPAQUE IOHEXOL 350 MG/ML SOLN COMPARISON:  Brain MRI and head CT performed earlier the same day 03/10/2020 FINDINGS: CTA NECK FINDINGS Aortic arch: Standard aortic branching. Atherosclerotic plaque within the visualized aortic arch and proximal major branch vessels of the neck. No hemodynamically significant innominate or proximal subclavian artery stenosis. Right carotid system: CCA and ICA patent within the neck without significant stenosis (50% or greater). Mild calcified plaque within the CCA, carotid bifurcation and proximal ICA. Left carotid system: CCA and ICA patent within the neck without significant stenosis (50% or greater). Mild to moderate mixed plaque within the distal  CCA, carotid bifurcation and proximal ICA. Vertebral arteries: Mixed plaque at the origin the right vertebral artery. There is  little, if any, opacification of the V1 and V2 right vertebral artery to the C2 level. There is reconstitution at the C2 and more cranial levels. The dominant left vertebral artery is patent throughout the neck without significant stenosis. Minimal calcified plaque at the origin of this vessel. Skeleton: No acute bony abnormality or aggressive osseous lesion. Cervical spondylosis with multilevel disc degeneration, posterior disc osteophytes, uncovertebral and facet hypertrophy. Prominent multilevel cervical anterior osteophytes, most notably at C3-C4. Multilevel degenerative osseous fusion Other neck: No neck mass or cervical lymphadenopathy. Subcentimeter right thyroid lobe nodule not meeting consensus criteria for ultrasound follow-up. Upper chest: No consolidation within the imaged lung apices. Review of the MIP images confirms the above findings CTA HEAD FINDINGS Anterior circulation: The intracranial internal carotid arteries are patent. Mild calcified plaque within these vessels without significant stenosis. The M1 middle cerebral arteries are patent without significant stenosis. No M2 proximal branch occlusion is identified. Moderate focal stenosis within a proximal to mid M2 left MCA branch vessel (series 12, image 24). The anterior cerebral arteries are patent without significant proximal stenosis. There is a 1-2 mm inferiorly projecting vascular protrusion arising from the supraclinoid right ICA which may reflect an infundibulum or tiny aneurysm (series 9, image 75). Posterior circulation: Multifocal high-grade stenoses within the V4 right vertebral artery. The proximal right PICA is poorly delineated suggestive of high-grade stenosis within this vessel. The intracranial left vertebral artery is patent without significant stenosis, as is the basilar artery. The posterior cerebral  arteries are patent bilaterally without significant proximal stenosis. Atherosclerotic irregularity of the P3 and more distal posterior cerebral arteries. Posterior communicating arteries are poorly delineated and may be hypoplastic or absent bilaterally. Venous sinuses: Within limitations of contrast timing, no convincing thrombus. Anatomic variants: As described. Review of the MIP images confirms the above findings IMPRESSION: CTA neck: 1. There is little, if any, opacification of the right vertebral artery to the C2 level. Findings may be secondary to atherosclerotic disease. Dissection cannot be excluded. There is reconstitution of flow at the C2 and more cranial levels. Multifocal high-grade stenoses within the V4 right vertebral artery. Additionally, the proximal right PICA is poorly delineated suggestive of high-grade stenosis. 2. The bilateral common and internal carotid arteries are patent within the neck without hemodynamically significant stenosis. Atherosclerotic plaque within the carotid systems as described. CTA head: 1. Multifocal high-grade stenoses within the V4 right vertebral artery and suspected high-grade stenosis of the proximal right PICA as described above. 2. Moderate focal stenosis within a proximal-to-mid M2 left MCA branch vessel. 3. No other significant proximal intracranial arterial stenosis is identified. 4. 1-2 mm infundibulum versus tiny aneurysm arising from the supraclinoid right ICA. Electronically Signed   By: Kellie Simmering DO   On: 03/10/2020 16:24   CT Head Wo Contrast  Result Date: 03/10/2020 CLINICAL DATA:  Blurry vision, history of hemorrhage. Additional history provided: Blurred vision and dizziness onset this morning. EXAM: CT HEAD WITHOUT CONTRAST TECHNIQUE: Contiguous axial images were obtained from the base of the skull through the vertex without intravenous contrast. COMPARISON:  Brain MRI 09/01/2017, head CT 01/06/2016 FINDINGS: Brain: Ill-defined hypoattenuation  within the cerebral white matter is nonspecific, but consistent with chronic small vessel ischemic disease. Known small chronic lacunar infarct within the left thalamus/internal capsule better appreciated on prior MRI 09/01/2017. Stable, moderate generalized parenchymal atrophy. There is no acute intracranial hemorrhage. No demarcated cortical infarct. No extra-axial fluid collection. No evidence of intracranial mass. No midline shift. Vascular: No hyperdense vessel.  Atherosclerotic calcifications Skull: Normal. Negative for fracture or focal lesion. Sinuses/Orbits: Visualized orbits show no acute finding. Mild ethmoid sinus mucosal thickening. No significant mastoid effusion. IMPRESSION: 1. No evidence of acute intracranial abnormality. 2. Moderate generalized parenchymal atrophy with chronic small vessel ischemic disease. Known remote left thalamic/internal capsule lacunar infarct. 3. Mild ethmoid sinus mucosal thickening. Electronically Signed   By: Kellie Simmering DO   On: 03/10/2020 12:55   CT Angio Neck W and/or Wo Contrast  Result Date: 03/10/2020 CLINICAL DATA:  Transient dizziness and blurry vision. Additional provided: Transient dizziness and blurred vision since Wednesday. EXAM: CT ANGIOGRAPHY HEAD AND NECK TECHNIQUE: Multidetector CT imaging of the head and neck was performed using the standard protocol during bolus administration of intravenous contrast. Multiplanar CT image reconstructions and MIPs were obtained to evaluate the vascular anatomy. Carotid stenosis measurements (when applicable) are obtained utilizing NASCET criteria, using the distal internal carotid diameter as the denominator. CONTRAST:  33mL OMNIPAQUE IOHEXOL 350 MG/ML SOLN COMPARISON:  Brain MRI and head CT performed earlier the same day 03/10/2020 FINDINGS: CTA NECK FINDINGS Aortic arch: Standard aortic branching. Atherosclerotic plaque within the visualized aortic arch and proximal major branch vessels of the neck. No  hemodynamically significant innominate or proximal subclavian artery stenosis. Right carotid system: CCA and ICA patent within the neck without significant stenosis (50% or greater). Mild calcified plaque within the CCA, carotid bifurcation and proximal ICA. Left carotid system: CCA and ICA patent within the neck without significant stenosis (50% or greater). Mild to moderate mixed plaque within the distal CCA, carotid bifurcation and proximal ICA. Vertebral arteries: Mixed plaque at the origin the right vertebral artery. There is little, if any, opacification of the V1 and V2 right vertebral artery to the C2 level. There is reconstitution at the C2 and more cranial levels. The dominant left vertebral artery is patent throughout the neck without significant stenosis. Minimal calcified plaque at the origin of this vessel. Skeleton: No acute bony abnormality or aggressive osseous lesion. Cervical spondylosis with multilevel disc degeneration, posterior disc osteophytes, uncovertebral and facet hypertrophy. Prominent multilevel cervical anterior osteophytes, most notably at C3-C4. Multilevel degenerative osseous fusion Other neck: No neck mass or cervical lymphadenopathy. Subcentimeter right thyroid lobe nodule not meeting consensus criteria for ultrasound follow-up. Upper chest: No consolidation within the imaged lung apices. Review of the MIP images confirms the above findings CTA HEAD FINDINGS Anterior circulation: The intracranial internal carotid arteries are patent. Mild calcified plaque within these vessels without significant stenosis. The M1 middle cerebral arteries are patent without significant stenosis. No M2 proximal branch occlusion is identified. Moderate focal stenosis within a proximal to mid M2 left MCA branch vessel (series 12, image 24). The anterior cerebral arteries are patent without significant proximal stenosis. There is a 1-2 mm inferiorly projecting vascular protrusion arising from the  supraclinoid right ICA which may reflect an infundibulum or tiny aneurysm (series 9, image 75). Posterior circulation: Multifocal high-grade stenoses within the V4 right vertebral artery. The proximal right PICA is poorly delineated suggestive of high-grade stenosis within this vessel. The intracranial left vertebral artery is patent without significant stenosis, as is the basilar artery. The posterior cerebral arteries are patent bilaterally without significant proximal stenosis. Atherosclerotic irregularity of the P3 and more distal posterior cerebral arteries. Posterior communicating arteries are poorly delineated and may be hypoplastic or absent bilaterally. Venous sinuses: Within limitations of contrast timing, no convincing thrombus. Anatomic variants: As described. Review of the MIP images confirms the above findings IMPRESSION: CTA  neck: 1. There is little, if any, opacification of the right vertebral artery to the C2 level. Findings may be secondary to atherosclerotic disease. Dissection cannot be excluded. There is reconstitution of flow at the C2 and more cranial levels. Multifocal high-grade stenoses within the V4 right vertebral artery. Additionally, the proximal right PICA is poorly delineated suggestive of high-grade stenosis. 2. The bilateral common and internal carotid arteries are patent within the neck without hemodynamically significant stenosis. Atherosclerotic plaque within the carotid systems as described. CTA head: 1. Multifocal high-grade stenoses within the V4 right vertebral artery and suspected high-grade stenosis of the proximal right PICA as described above. 2. Moderate focal stenosis within a proximal-to-mid M2 left MCA branch vessel. 3. No other significant proximal intracranial arterial stenosis is identified. 4. 1-2 mm infundibulum versus tiny aneurysm arising from the supraclinoid right ICA. Electronically Signed   By: Kellie Simmering DO   On: 03/10/2020 16:24   MR BRAIN WO  CONTRAST  Result Date: 03/10/2020 CLINICAL DATA:  Transient dizziness and blurry vision. EXAM: MRI HEAD WITHOUT CONTRAST TECHNIQUE: Multiplanar, multiecho pulse sequences of the brain and surrounding structures were obtained without intravenous contrast. COMPARISON:  Head CT 03/10/2020, brain MRI 09/01/2017 FINDINGS: Brain: Moderate scattered T2/FLAIR hyperintensity within the cerebral white matter is nonspecific, but consistent with chronic small vessel ischemic disease. Findings have not significantly changed since MRI 09/01/2017. Redemonstrated chronic lacunar infarcts within the left thalamocapsular junction and right cerebellar hemisphere. Multiple supratentorial and infratentorial chronic microhemorrhages within the cerebral hemispheres, basal ganglia, left thalamus and cerebellum. Stable, moderate generalized parenchymal atrophy. There is no acute infarct. No evidence of intracranial mass. No extra-axial fluid collection. No midline shift. Vascular: Loss of the expected flow void within portions of the V4 right vertebral artery (series 10, image 1). Skull and upper cervical spine: No focal marrow lesion. Sinuses/Orbits: Bilateral lens replacements. Small right maxillary sinus mucous retention cyst. Mild ethmoid sinus mucosal thickening. IMPRESSION: 1. No evidence of acute infarct. 2. Signal abnormality within portions of the V4 right vertebral artery suggestive of high-grade stenosis or occlusion. Consider CTA or MRA for further evaluation. 3. Stable moderate generalized parenchymal and chronic small vessel ischemic disease. 4. Redemonstrated chronic lacunar infarcts within the left thalamocapsular junction and right cerebellum. 5. 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 Electronically Signed   By: Kellie Simmering DO   On: 03/10/2020 15:12    Procedures Procedures (including critical care  time)  Medications Ordered in ED Medications  iohexol (OMNIPAQUE) 350 MG/ML injection 50 mL (50 mLs Intravenous Contrast Given 03/10/20 1535)    ED Course  I have reviewed the triage vital signs and the nursing notes.  Pertinent labs & imaging results that were available during my care of the patient were reviewed by me and considered in my medical decision making (see chart for details).  Clinical Course as of Mar 10 1717  Sun Mar 10, 2020  1318 CT of the head is unremarkable.  Will order MRI, CT angio of the head and neck per neurology recommendations.   [HK]    Clinical Course User Index [HK] Delia Heady, PA-C   MDM Rules/Calculators/A&P                      86 year old male with a past medical history of ICH in 2017, hypertension, hyperlipidemia presenting to the ED with a chief complaint of blurry vision.  States that he was in his usual  state of health when he woke up this morning, prior to arrival he was reading the newspaper when he started experiencing blurry vision in both of his eyes as well as dizziness.  Symptoms lasted for about 1 minute before spontaneously resolving. He notes an episode of dizziness earlier in the week while walking back from church on 03/06/20. He did not have blurry vision at that time. He denies any prior or current headache, numbness in arms or legs, injuries or falls ,chest pain, shortness of breath. On my exam patient is alert, oriented to baseline 2/2 his dementia. He has no weakness, numbness or changes to coordination noted. He is hypertensive here which could be because he has not taken his home antihypertensive yet. CT of the head negative for bleed. Labwork is unremarkable. Will need to obtain MRI of the brain as well as CTA head/neck for further evaluation.   5:18 PM CTA of the head and neck shows several areas of high-grade stenoses.  Case reviewed with Dr. Cheral Marker of neurology.  He recommends neuro consult and admission to the medicine service.   Patient and family updated on plan.   All imaging, if done today, including plain films, CT scans, and ultrasounds, independently reviewed by me, and interpretations confirmed via formal radiology reads.  Portions of this note were generated with [employer]. Dictation errors may occur despite best attempts at proofreading.  Final Clinical Impression(s) / ED Diagnoses Final diagnoses:  Intracranial vascular stenosis    Rx / DC Orders ED Discharge Orders    None       Delia Heady, PA-C 03/10/20 1718    Maudie Flakes, MD 03/14/20 [phone]

## 2020-03-10 NOTE — Consult Note (Addendum)
Referring Physician: Dr. Sedonia Small    Chief Complaint: Blurred vision and vertigo today after having a spell of vertigo 4 days ago.   HPI: Leroy [name] is an 86 y.o. male with a PMHx of dementia, CKD, HLD, HTN, intraventricular conduction defect, PVD, prior stroke in 2017 with weakness in legs, tremor and lumbar laminectomy, who presented to the ED today via EMS after onset of blurred vision and dizziness while reading the newspaper about 0730 this AM. Blurred vision was in both eyes and was described as "white and fuzzy". He could not see half of the comic page. There was no accompanying headache or eye pain. He had an episode of vertigo 4 days ago while standing at church, lasting 5 minutes.  Patient does not take daily ASA d/t previous history of bleeding ulcer in which he was vomiting blood.  He was hypertensive with EMS at 180/98, HR 60, normal O2 sats and afebrile. CBG was 187.   At the time of Neurological evaluation, patient's symptoms had resolved.   MRI brain obtained in the ED showed right vertebral artery high-grade stenosis versus occlusion, but no acute infarction. Chronic microhemorrhages and chronic lacunar infarctions were also noted.   CTA neck revealed right vertebral artery disease most consistent with atherosclerosis, with dissection not excludable however. There were findings consistent with high grade stenosis of the right PICA as well as atherosclerotic plaque in the carotid systems.   CTA of head revealed multifocal high-grade stenoses within the V4 right vertebral artery and suspected high-grade stenosis of the proximal right PICA. Also noted was moderate focal stenosis within a proximal-to-mid M2 left MCA branch vessel.  MRI brain: 1. No evidence of acute infarct. 2. Signal abnormality within portions of the V4 right vertebral artery suggestive of high-grade stenosis or occlusion. Consider CTA or MRA for further evaluation. 3. Stable moderate generalized parenchymal and  chronic small vessel ischemic disease. 4. Redemonstrated chronic lacunar infarcts within the left thalamocapsular junction and right cerebellum. 5. 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  CTA neck: 1. There is little, if any, opacification of the right vertebral artery to the C2 level. Findings may be secondary to atherosclerotic disease. Dissection cannot be excluded. There is reconstitution of flow at the C2 and more cranial levels. Multifocal high-grade stenoses within the V4 right vertebral artery. Additionally, the proximal right PICA is poorly delineated suggestive of high-grade stenosis. 2. The bilateral common and internal carotid arteries are patent within the neck without hemodynamically significant stenosis. Atherosclerotic plaque within the carotid systems as described.  CTA head: 1. Multifocal high-grade stenoses within the V4 right vertebral artery and suspected high-grade stenosis of the proximal right PICA as described above. 2. Moderate focal stenosis within a proximal-to-mid M2 left MCA branch vessel. 3. No other significant proximal intracranial arterial stenosis is identified. 4. 1-2 mm infundibulum versus tiny aneurysm arising from the supraclinoid right ICA.  LSN: [id number] tPA Given: No: outside of window  Past Medical History:  Diagnosis Date  . Anemia   . Bursitis   . Cancer ([place])    skin cancer face  . Chronic kidney disease   . Dementia ([place])   . Dyspnea   . GI bleed   . History of hiatal hernia   . Hyperlipidemia   . Hypertension   . IVCD (intraventricular conduction defect)   . Leg weakness, bilateral   . Obesity   . Peripheral vascular disease ([place])   . Stomach  ulcer   . Stroke [facility])    feb 2017, weakness in legs,   . Tremor   . Vitamin B 12 deficiency   . Vitamin D deficiency     Past Surgical History:   Procedure Laterality Date  . BACK SURGERY    . ESOPHAGOGASTRODUODENOSCOPY (EGD) WITH PROPOFOL N/A 11/09/2018   Procedure: ESOPHAGOGASTRODUODENOSCOPY (EGD) WITH PROPOFOL;  Surgeon: Manya Silvas, MD;  Location: [facility] ENDOSCOPY;  Service: Endoscopy;  Laterality: N/A;  . EYE SURGERY      bilateral cataracts  . HERNIA REPAIR     inguinal on left  . INGUINAL HERNIA REPAIR Right 12/29/2017   Procedure: HERNIA REPAIR INGUINAL ADULT;  Surgeon: Herbert Pun, MD;  Location: [facility] ORS;  Service: General;  Laterality: Right;  . INSERTION OF MESH Right 12/29/2017   Procedure: INSERTION OF MESH;  Surgeon: Herbert Pun, MD;  Location: [facility] ORS;  Service: General;  Laterality: Right;  . LUMBAR LAMINECTOMY/DECOMPRESSION MICRODISCECTOMY N/A 08/12/2016   Procedure: LAMINECTOMY AND FORAMINOTOMY LUMBAR FOUR - LUMBAR FIVE;  Surgeon: Newman Pies, MD;  Location: [facility];  Service: Neurosurgery;  Laterality: N/A;  LAMINECTOMY AND FORAMINOTOMY L4-L5    Family History  Problem Relation Age of Onset  . CAD Mother   . Cancer Father    Social History:  reports that he has never smoked. His smokeless tobacco use includes chew. He reports that he does not drink alcohol or use drugs.  Allergies:  Allergies  Allergen Reactions  . Aspirin Other (See Comments)    He has bleeding ulcers    Medications:  Current Facility-Administered Medications  Medication Dose Route Frequency Provider Last Rate Last Admin  .  stroke: mapping our early stages of recovery book   Does not apply Once Starla Link, Kshitiz, MD      . 0.9 %  sodium chloride infusion   Intravenous Continuous Alekh, Kshitiz, MD      . acetaminophen (TYLENOL) tablet 650 mg  650 mg Oral Q4H PRN Aline August, MD       Or  . acetaminophen (TYLENOL) 160 MG/5ML solution 650 mg  650 mg Per Tube Q4H PRN Aline August, MD       Or  . acetaminophen (TYLENOL) suppository 650 mg  650 mg Rectal Q4H PRN Alekh, Kshitiz, MD      . atorvastatin (LIPITOR)  tablet 40 mg  40 mg Oral QHS Alekh, Kshitiz, MD      . heparin injection 5,000 Units  5,000 Units Subcutaneous Q8H Alekh, Kshitiz, MD      . hydrALAZINE (APRESOLINE) injection 5 mg  5 mg Intravenous Q4H PRN Alekh, Kshitiz, MD      . senna-docusate (Senokot-S) tablet 1 tablet  1 tablet Oral QHS PRN Aline August, MD       Current Outpatient Medications  Medication Sig Dispense Refill  . acetaminophen (TYLENOL) 500 MG tablet Take 500 mg by mouth daily as needed for moderate pain or headache.    Marland Kitchen atorvastatin (LIPITOR) 40 MG tablet Take 1 tablet (40 mg total) by mouth daily. (Patient taking differently: Take 20 mg by mouth every evening. ) 30 tablet 2  . cholecalciferol (VITAMIN D) 1000 units tablet Take 2,000 Units by mouth daily.     . metoprolol succinate (TOPROL-XL) 25 MG 24 hr tablet Take 12.5 mg by mouth in the morning.        ROS: ROS was performed and is negative except as noted in HPI   Physical Examination: Blood pressure (!) 180/88, pulse Marland Kitchen)  58, temperature 98.7 F (37.1 C), temperature source Oral, resp. rate 12, SpO2 99 %.  HEENT: [place]/AT Lungs: Respirations unlabored Ext: No edema  Neurologic Examination: Ment: Awake and alert. Speech fluent with intact naming and comprehension. No dysarthria. Oriented to name/age/month/year, city and county but not state. Mild confusion to complex questions.  CN: No visual field cut with all fields tested individually in each eye. No visual extinction to DSS. PERRL, EOMI, face symmetric, tongue protrudes midline, shoulder shrug equal. Facial LT sensation intact. Motor: 5/5 in all 4 extremities DTR: patellae 2+ bilaterally Cerebellar: FNF without ataxia bilaterally Gait: Deferred  Results for orders placed or performed during the hospital encounter of 03/10/20 (from the past 48 hour(s))  Ethanol     Status: None   Collection Time: 03/10/20 12:46 PM  Result Value Ref Range   Alcohol, Ethyl (B) <10 <10 mg/dL    Comment: (NOTE) Lowest  detectable limit for serum alcohol is 10 mg/dL. For medical purposes only. Performed at [facility], [place] [address], [place], [place] [address]   Protime-INR     Status: None   Collection Time: 03/10/20 12:46 PM  Result Value Ref Range   Prothrombin Time 12.5 11.4 - 15.2 seconds   INR 1.0 0.8 - 1.2    Comment: (NOTE) INR goal varies based on device and disease states. Performed at [facility], [place] [address] [place], [place] [address]   APTT     Status: Abnormal   Collection Time: 03/10/20 12:46 PM  Result Value Ref Range   aPTT 37 (H) 24 - 36 seconds    Comment:        IF BASELINE aPTT IS ELEVATED, SUGGEST PATIENT RISK ASSESSMENT BE USED TO DETERMINE APPROPRIATE ANTICOAGULANT THERAPY. Performed at [facility], [place] [address], [place], [place] [address]   CBC     Status: Abnormal   Collection Time: 03/10/20 12:46 PM  Result Value Ref Range   WBC 7.7 4.0 - 10.5 K/uL   RBC 4.19 (L) 4.22 - 5.81 MIL/uL   Hemoglobin 12.7 (L) 13.0 - 17.0 g/dL   HCT 38.4 (L) 39.0 - 52.0 %   MCV 91.6 80.0 - 100.0 fL   MCH 30.3 26.0 - 34.0 pg   MCHC 33.1 30.0 - 36.0 g/dL   RDW 12.9 11.5 - 15.5 %   Platelets 242 150 - 400 K/uL   nRBC 0.0 0.0 - 0.2 %    Comment: Performed at [facility], [place] [address] [place], [place] [address]  Differential     Status: None   Collection Time: 03/10/20 12:46 PM  Result Value Ref Range   Neutrophils Relative % 59 %   Neutro Abs 4.6 1.7 - 7.7 K/uL   Lymphocytes Relative 30 %   Lymphs Abs 2.3 0.7 - 4.0 K/uL   Monocytes Relative 8 %   Monocytes Absolute 0.6 0.1 - 1.0 K/uL   Eosinophils Relative 2 %   Eosinophils Absolute 0.1 0.0 - 0.5 K/uL   Basophils Relative 1 %   Basophils Absolute 0.1 0.0 - 0.1 K/uL   Immature Granulocytes 0 %   Abs Immature Granulocytes 0.02 0.00 - 0.07 K/uL    Comment: Performed at [facility] [address], [place], [place] [address]  Comprehensive metabolic panel     Status: Abnormal    Collection Time: 03/10/20 12:46 PM  Result Value Ref Range   Sodium 142 135 - 145 mmol/L   Potassium  4.2 3.5 - 5.1 mmol/L   Chloride 107 98 - 111 mmol/L   CO2 25 22 - 32 mmol/L   Glucose, Bld 97 70 - 99 mg/dL    Comment: Glucose reference range applies only to samples taken after fasting for at least 8 hours.   BUN 18 8 - 23 mg/dL   Creatinine, Ser 1.36 (H) 0.61 - 1.24 mg/dL   Calcium 8.9 8.9 - 10.3 mg/dL   Total Protein 6.8 6.5 - 8.1 g/dL   Albumin 3.4 (L) 3.5 - 5.0 g/dL   AST 21 15 - 41 U/L   ALT 15 0 - 44 U/L   Alkaline Phosphatase 73 38 - 126 U/L   Total Bilirubin 1.0 0.3 - 1.2 mg/dL   GFR calc non Af Amer 46 (L) >60 mL/min   GFR calc Af Amer 54 (L) >60 mL/min   Anion gap 10 5 - 15    Comment: Performed at [facility] [address], [place], [place] [address]  Urinalysis, Routine w reflex microscopic     Status: Abnormal   Collection Time: 03/10/20 12:50 PM  Result Value Ref Range   Color, Urine STRAW (A) YELLOW   APPearance CLEAR CLEAR   Specific Gravity, Urine 1.009 1.005 - 1.030   pH 6.0 5.0 - 8.0   Glucose, UA NEGATIVE NEGATIVE mg/dL   Hgb urine dipstick SMALL (A) NEGATIVE   Bilirubin Urine NEGATIVE NEGATIVE   Ketones, ur NEGATIVE NEGATIVE mg/dL   Protein, ur NEGATIVE NEGATIVE mg/dL   Nitrite NEGATIVE NEGATIVE   Leukocytes,Ua NEGATIVE NEGATIVE   RBC / HPF 0-5 0 - 5 RBC/hpf   WBC, UA 0-5 0 - 5 WBC/hpf   Bacteria, UA NONE SEEN NONE SEEN    Comment: Performed at [facility] [address] [place], [place] [address]  I-stat chem 8, ED     Status: Abnormal   Collection Time: 03/10/20 12:57 PM  Result Value Ref Range   Sodium 140 135 - 145 mmol/L   Potassium 3.7 3.5 - 5.1 mmol/L   Chloride 103 98 - 111 mmol/L   BUN 21 8 - 23 mg/dL   Creatinine, Ser 1.40 (H) 0.61 - 1.24 mg/dL   Glucose, Bld 94 70 - 99 mg/dL    Comment: Glucose reference range applies only to samples taken after fasting for at least 8 hours.   Calcium, Ion 1.19 1.15 - 1.40 mmol/L    TCO2 28 22 - 32 mmol/L   Hemoglobin 12.2 (L) 13.0 - 17.0 g/dL   HCT 36.0 (L) 39.0 - 52.0 %   CT Angio Head W or Wo Contrast  Result Date: 03/10/2020 CLINICAL DATA:  Transient dizziness and blurry vision. Additional provided: Transient dizziness and blurred vision since Wednesday. EXAM: CT ANGIOGRAPHY HEAD AND NECK TECHNIQUE: Multidetector CT imaging of the head and neck was performed using the standard protocol during bolus administration of intravenous contrast. Multiplanar CT image reconstructions and MIPs were obtained to evaluate the vascular anatomy. Carotid stenosis measurements (when applicable) are obtained utilizing NASCET criteria, using the distal internal carotid diameter as the denominator. CONTRAST:  78mL OMNIPAQUE IOHEXOL 350 MG/ML SOLN COMPARISON:  Brain MRI and head CT performed earlier the same day 03/10/2020 FINDINGS: CTA NECK FINDINGS Aortic arch: Standard aortic branching. Atherosclerotic plaque within the visualized aortic arch and proximal major branch vessels of the neck. No hemodynamically significant innominate or proximal subclavian artery stenosis. Right carotid system: CCA and ICA patent within the neck without significant stenosis (50%  or greater). Mild calcified plaque within the CCA, carotid bifurcation and proximal ICA. Left carotid system: CCA and ICA patent within the neck without significant stenosis (50% or greater). Mild to moderate mixed plaque within the distal CCA, carotid bifurcation and proximal ICA. Vertebral arteries: Mixed plaque at the origin the right vertebral artery. There is little, if any, opacification of the V1 and V2 right vertebral artery to the C2 level. There is reconstitution at the C2 and more cranial levels. The dominant left vertebral artery is patent throughout the neck without significant stenosis. Minimal calcified plaque at the origin of this vessel. Skeleton: No acute bony abnormality or aggressive osseous lesion. Cervical spondylosis with  multilevel disc degeneration, posterior disc osteophytes, uncovertebral and facet hypertrophy. Prominent multilevel cervical anterior osteophytes, most notably at C3-C4. Multilevel degenerative osseous fusion Other neck: No neck mass or cervical lymphadenopathy. Subcentimeter right thyroid lobe nodule not meeting consensus criteria for ultrasound follow-up. Upper chest: No consolidation within the imaged lung apices. Review of the MIP images confirms the above findings CTA HEAD FINDINGS Anterior circulation: The intracranial internal carotid arteries are patent. Mild calcified plaque within these vessels without significant stenosis. The M1 middle cerebral arteries are patent without significant stenosis. No M2 proximal branch occlusion is identified. Moderate focal stenosis within a proximal to mid M2 left MCA branch vessel (series 12, image 24). The anterior cerebral arteries are patent without significant proximal stenosis. There is a 1-2 mm inferiorly projecting vascular protrusion arising from the supraclinoid right ICA which may reflect an infundibulum or tiny aneurysm (series 9, image 75). Posterior circulation: Multifocal high-grade stenoses within the V4 right vertebral artery. The proximal right PICA is poorly delineated suggestive of high-grade stenosis within this vessel. The intracranial left vertebral artery is patent without significant stenosis, as is the basilar artery. The posterior cerebral arteries are patent bilaterally without significant proximal stenosis. Atherosclerotic irregularity of the P3 and more distal posterior cerebral arteries. Posterior communicating arteries are poorly delineated and may be hypoplastic or absent bilaterally. Venous sinuses: Within limitations of contrast timing, no convincing thrombus. Anatomic variants: As described. Review of the MIP images confirms the above findings IMPRESSION: CTA neck: 1. There is little, if any, opacification of the right vertebral artery  to the C2 level. Findings may be secondary to atherosclerotic disease. Dissection cannot be excluded. There is reconstitution of flow at the C2 and more cranial levels. Multifocal high-grade stenoses within the V4 right vertebral artery. Additionally, the proximal right PICA is poorly delineated suggestive of high-grade stenosis. 2. The bilateral common and internal carotid arteries are patent within the neck without hemodynamically significant stenosis. Atherosclerotic plaque within the carotid systems as described. CTA head: 1. Multifocal high-grade stenoses within the V4 right vertebral artery and suspected high-grade stenosis of the proximal right PICA as described above. 2. Moderate focal stenosis within a proximal-to-mid M2 left MCA branch vessel. 3. No other significant proximal intracranial arterial stenosis is identified. 4. 1-2 mm infundibulum versus tiny aneurysm arising from the supraclinoid right ICA. Electronically Signed   By: Kellie Simmering DO   On: 03/10/2020 16:24   CT Head Wo Contrast  Result Date: 03/10/2020 CLINICAL DATA:  Blurry vision, history of hemorrhage. Additional history provided: Blurred vision and dizziness onset this morning. EXAM: CT HEAD WITHOUT CONTRAST TECHNIQUE: Contiguous axial images were obtained from the base of the skull through the vertex without intravenous contrast. COMPARISON:  Brain MRI 09/01/2017, head CT 01/06/2016 FINDINGS: Brain: Ill-defined hypoattenuation within the cerebral white matter is nonspecific,  but consistent with chronic small vessel ischemic disease. Known small chronic lacunar infarct within the left thalamus/internal capsule better appreciated on prior MRI 09/01/2017. Stable, moderate generalized parenchymal atrophy. There is no acute intracranial hemorrhage. No demarcated cortical infarct. No extra-axial fluid collection. No evidence of intracranial mass. No midline shift. Vascular: No hyperdense vessel.  Atherosclerotic calcifications Skull:  Normal. Negative for fracture or focal lesion. Sinuses/Orbits: Visualized orbits show no acute finding. Mild ethmoid sinus mucosal thickening. No significant mastoid effusion. IMPRESSION: 1. No evidence of acute intracranial abnormality. 2. Moderate generalized parenchymal atrophy with chronic small vessel ischemic disease. Known remote left thalamic/internal capsule lacunar infarct. 3. Mild ethmoid sinus mucosal thickening. Electronically Signed   By: Kellie Simmering DO   On: 03/10/2020 12:55   CT Angio Neck W and/or Wo Contrast  Result Date: 03/10/2020 CLINICAL DATA:  Transient dizziness and blurry vision. Additional provided: Transient dizziness and blurred vision since Wednesday. EXAM: CT ANGIOGRAPHY HEAD AND NECK TECHNIQUE: Multidetector CT imaging of the head and neck was performed using the standard protocol during bolus administration of intravenous contrast. Multiplanar CT image reconstructions and MIPs were obtained to evaluate the vascular anatomy. Carotid stenosis measurements (when applicable) are obtained utilizing NASCET criteria, using the distal internal carotid diameter as the denominator. CONTRAST:  48mL OMNIPAQUE IOHEXOL 350 MG/ML SOLN COMPARISON:  Brain MRI and head CT performed earlier the same day 03/10/2020 FINDINGS: CTA NECK FINDINGS Aortic arch: Standard aortic branching. Atherosclerotic plaque within the visualized aortic arch and proximal major branch vessels of the neck. No hemodynamically significant innominate or proximal subclavian artery stenosis. Right carotid system: CCA and ICA patent within the neck without significant stenosis (50% or greater). Mild calcified plaque within the CCA, carotid bifurcation and proximal ICA. Left carotid system: CCA and ICA patent within the neck without significant stenosis (50% or greater). Mild to moderate mixed plaque within the distal CCA, carotid bifurcation and proximal ICA. Vertebral arteries: Mixed plaque at the origin the right vertebral  artery. There is little, if any, opacification of the V1 and V2 right vertebral artery to the C2 level. There is reconstitution at the C2 and more cranial levels. The dominant left vertebral artery is patent throughout the neck without significant stenosis. Minimal calcified plaque at the origin of this vessel. Skeleton: No acute bony abnormality or aggressive osseous lesion. Cervical spondylosis with multilevel disc degeneration, posterior disc osteophytes, uncovertebral and facet hypertrophy. Prominent multilevel cervical anterior osteophytes, most notably at C3-C4. Multilevel degenerative osseous fusion Other neck: No neck mass or cervical lymphadenopathy. Subcentimeter right thyroid lobe nodule not meeting consensus criteria for ultrasound follow-up. Upper chest: No consolidation within the imaged lung apices. Review of the MIP images confirms the above findings CTA HEAD FINDINGS Anterior circulation: The intracranial internal carotid arteries are patent. Mild calcified plaque within these vessels without significant stenosis. The M1 middle cerebral arteries are patent without significant stenosis. No M2 proximal branch occlusion is identified. Moderate focal stenosis within a proximal to mid M2 left MCA branch vessel (series 12, image 24). The anterior cerebral arteries are patent without significant proximal stenosis. There is a 1-2 mm inferiorly projecting vascular protrusion arising from the supraclinoid right ICA which may reflect an infundibulum or tiny aneurysm (series 9, image 75). Posterior circulation: Multifocal high-grade stenoses within the V4 right vertebral artery. The proximal right PICA is poorly delineated suggestive of high-grade stenosis within this vessel. The intracranial left vertebral artery is patent without significant stenosis, as is the basilar artery. The posterior cerebral arteries  are patent bilaterally without significant proximal stenosis. Atherosclerotic irregularity of the P3  and more distal posterior cerebral arteries. Posterior communicating arteries are poorly delineated and may be hypoplastic or absent bilaterally. Venous sinuses: Within limitations of contrast timing, no convincing thrombus. Anatomic variants: As described. Review of the MIP images confirms the above findings IMPRESSION: CTA neck: 1. There is little, if any, opacification of the right vertebral artery to the C2 level. Findings may be secondary to atherosclerotic disease. Dissection cannot be excluded. There is reconstitution of flow at the C2 and more cranial levels. Multifocal high-grade stenoses within the V4 right vertebral artery. Additionally, the proximal right PICA is poorly delineated suggestive of high-grade stenosis. 2. The bilateral common and internal carotid arteries are patent within the neck without hemodynamically significant stenosis. Atherosclerotic plaque within the carotid systems as described. CTA head: 1. Multifocal high-grade stenoses within the V4 right vertebral artery and suspected high-grade stenosis of the proximal right PICA as described above. 2. Moderate focal stenosis within a proximal-to-mid M2 left MCA branch vessel. 3. No other significant proximal intracranial arterial stenosis is identified. 4. 1-2 mm infundibulum versus tiny aneurysm arising from the supraclinoid right ICA. Electronically Signed   By: Kellie Simmering DO   On: 03/10/2020 16:24   MR BRAIN WO CONTRAST  Result Date: 03/10/2020 CLINICAL DATA:  Transient dizziness and blurry vision. EXAM: MRI HEAD WITHOUT CONTRAST TECHNIQUE: Multiplanar, multiecho pulse sequences of the brain and surrounding structures were obtained without intravenous contrast. COMPARISON:  Head CT 03/10/2020, brain MRI 09/01/2017 FINDINGS: Brain: Moderate scattered T2/FLAIR hyperintensity within the cerebral white matter is nonspecific, but consistent with chronic small vessel ischemic disease. Findings have not significantly changed since MRI  09/01/2017. Redemonstrated chronic lacunar infarcts within the left thalamocapsular junction and right cerebellar hemisphere. Multiple supratentorial and infratentorial chronic microhemorrhages within the cerebral hemispheres, basal ganglia, left thalamus and cerebellum. Stable, moderate generalized parenchymal atrophy. There is no acute infarct. No evidence of intracranial mass. No extra-axial fluid collection. No midline shift. Vascular: Loss of the expected flow void within portions of the V4 right vertebral artery (series 10, image 1). Skull and upper cervical spine: No focal marrow lesion. Sinuses/Orbits: Bilateral lens replacements. Small right maxillary sinus mucous retention cyst. Mild ethmoid sinus mucosal thickening. IMPRESSION: 1. No evidence of acute infarct. 2. Signal abnormality within portions of the V4 right vertebral artery suggestive of high-grade stenosis or occlusion. Consider CTA or MRA for further evaluation. 3. Stable moderate generalized parenchymal and chronic small vessel ischemic disease. 4. Redemonstrated chronic lacunar infarcts within the left thalamocapsular junction and right cerebellum. 5. 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 Electronically Signed   By: Kellie Simmering DO   On: 03/10/2020 15:12    Assessment: 86 y.o. male with a PMHx of dementia, CKD, HLD, HTN and CVA (2017) who presented with sudden onset of blurred vision.  1. MRI brain obtained in the ED showed right vertebral artery high-grade stenosis versus occlusion, but no acute infarction. Chronic microhemorrhages and chronic lacunar infarctions were also noted.  2. CTA neck revealed right vertebral artery disease most consistent with atherosclerosis, with dissection not excludable however. There were findings consistent with high grade stenosis of the right PICA as well as atherosclerotic plaque in the  carotid systems.  3. CTA of head revealed multifocal high-grade stenoses within the V4 right vertebral artery and suspected high-grade stenosis of the proximal right PICA. Also noted was moderate focal stenosis within a  proximal-to-mid M2 left MCA branch vessel. 4. Complete stroke work-up needed. 5. Stroke Risk Factors - hyperlipidemia and hypertension, PVD and prior stroke   Recommendations: 1. HgbA1c, fasting lipid panel 2. PT consult, OT consult, Speech consult 3. Echocardiogram 4. Prophylactic therapy-start Plavix. The patient has an allergy to ASA. 5. Risk factor modification 6. Telemetry monitoring 7. Frequent neuro checks 8. IVF.  9. Modified permissive HTN protocol given advanced age. Treat SBP if > 180, then reduce by 20% per day to goal of 120-140.   @Electronically  signed: Dr. Kerney Elbe  03/10/2020, 4:53 PM

## 2020-03-10 NOTE — ED Triage Notes (Signed)
To room via EMS.  Onset this morning, while reading the newspaper, blurred vision and dizziness.  Described blurry vision as white and fuzzy.  No eye pain or headache.  Lasted 5 min.  Pt had episode of dizziness, head spinning, 4 days ago while standing at church, lasted 15 min.  EMS BP 180/98 HR 60 SpO2 97% temp 98.4 CBG 187

## 2020-03-11 ENCOUNTER — Observation Stay (HOSPITAL_BASED_OUTPATIENT_CLINIC_OR_DEPARTMENT_OTHER): Payer: Medicare HMO

## 2020-03-11 DIAGNOSIS — G459 Transient cerebral ischemic attack, unspecified: Secondary | ICD-10-CM

## 2020-03-11 DIAGNOSIS — I10 Essential (primary) hypertension: Secondary | ICD-10-CM | POA: Diagnosis not present

## 2020-03-11 DIAGNOSIS — E785 Hyperlipidemia, unspecified: Secondary | ICD-10-CM | POA: Diagnosis not present

## 2020-03-11 LAB — CBC WITH DIFFERENTIAL/PLATELET
Abs Immature Granulocytes: 0.02 10*3/uL (ref 0.00–0.07)
Basophils Absolute: 0.1 10*3/uL (ref 0.0–0.1)
Basophils Relative: 1 %
Eosinophils Absolute: 0.2 10*3/uL (ref 0.0–0.5)
Eosinophils Relative: 2 %
HCT: 36.9 % — ABNORMAL LOW (ref 39.0–52.0)
Hemoglobin: 12.3 g/dL — ABNORMAL LOW (ref 13.0–17.0)
Immature Granulocytes: 0 %
Lymphocytes Relative: 31 %
Lymphs Abs: 2.8 10*3/uL (ref 0.7–4.0)
MCH: 30.1 pg (ref 26.0–34.0)
MCHC: 33.3 g/dL (ref 30.0–36.0)
MCV: 90.2 fL (ref 80.0–100.0)
Monocytes Absolute: 0.8 10*3/uL (ref 0.1–1.0)
Monocytes Relative: 9 %
Neutro Abs: 5.2 10*3/uL (ref 1.7–7.7)
Neutrophils Relative %: 57 %
Platelets: 233 10*3/uL (ref 150–400)
RBC: 4.09 MIL/uL — ABNORMAL LOW (ref 4.22–5.81)
RDW: 12.9 % (ref 11.5–15.5)
WBC: 9.1 10*3/uL (ref 4.0–10.5)
nRBC: 0 % (ref 0.0–0.2)

## 2020-03-11 LAB — HEMOGLOBIN A1C
Hgb A1c MFr Bld: 5.8 % — ABNORMAL HIGH (ref 4.8–5.6)
Mean Plasma Glucose: 119.76 mg/dL

## 2020-03-11 LAB — MAGNESIUM: Magnesium: 1.8 mg/dL (ref 1.7–2.4)

## 2020-03-11 LAB — LIPID PANEL
Cholesterol: 165 mg/dL (ref 0–200)
HDL: 63 mg/dL (ref >40)
LDL Cholesterol: 86 mg/dL (ref 0–99)
Total CHOL/HDL Ratio: 2.6 RATIO
Triglycerides: 80 mg/dL (ref <150)
VLDL: 16 mg/dL (ref 0–40)

## 2020-03-11 LAB — ECHOCARDIOGRAM COMPLETE
Height: 74 in
Weight: 3365.1 oz

## 2020-03-11 LAB — COMPREHENSIVE METABOLIC PANEL
ALT: 14 U/L (ref 0–44)
AST: 18 U/L (ref 15–41)
Albumin: 3.3 g/dL — ABNORMAL LOW (ref 3.5–5.0)
Alkaline Phosphatase: 65 U/L (ref 38–126)
Anion gap: 11 (ref 5–15)
BUN: 23 mg/dL (ref 8–23)
CO2: 24 mmol/L (ref 22–32)
Calcium: 9.1 mg/dL (ref 8.9–10.3)
Chloride: 103 mmol/L (ref 98–111)
Creatinine, Ser: 1.59 mg/dL — ABNORMAL HIGH (ref 0.61–1.24)
GFR calc Af Amer: 45 mL/min — ABNORMAL LOW (ref >60)
GFR calc non Af Amer: 38 mL/min — ABNORMAL LOW (ref >60)
Glucose, Bld: 92 mg/dL (ref 70–99)
Potassium: 3.6 mmol/L (ref 3.5–5.1)
Sodium: 138 mmol/L (ref 135–145)
Total Bilirubin: 0.9 mg/dL (ref 0.3–1.2)
Total Protein: 6.4 g/dL — ABNORMAL LOW (ref 6.5–8.1)

## 2020-03-11 MED ORDER — CLOPIDOGREL BISULFATE 75 MG PO TABS
75.0000 mg | ORAL_TABLET | Freq: Every day | ORAL | Status: DC
  Administered 2020-03-11: 75 mg via ORAL
  Filled 2020-03-11: qty 1

## 2020-03-11 MED ORDER — CLOPIDOGREL BISULFATE 75 MG PO TABS: 75.0000 mg | ORAL_TABLET | Freq: Every day | ORAL | 0 refills | Status: AC

## 2020-03-11 MED ORDER — PANTOPRAZOLE SODIUM 40 MG PO TBEC
40.0000 mg | DELAYED_RELEASE_TABLET | Freq: Every day | ORAL | Status: DC
  Administered 2020-03-11: 40 mg via ORAL
  Filled 2020-03-11: qty 1

## 2020-03-11 MED ORDER — METOPROLOL SUCCINATE ER 25 MG PO TB24
12.5000 mg | ORAL_TABLET | Freq: Every day | ORAL | Status: DC
  Administered 2020-03-11: 12.5 mg via ORAL
  Filled 2020-03-11: qty 1

## 2020-03-11 MED ORDER — ASPIRIN EC 81 MG PO TBEC
81.0000 mg | DELAYED_RELEASE_TABLET | Freq: Every day | ORAL | Status: DC
  Administered 2020-03-11: 81 mg via ORAL
  Filled 2020-03-11: qty 1

## 2020-03-11 MED ORDER — ASPIRIN 81 MG PO TBEC: 81.0000 mg | DELAYED_RELEASE_TABLET | Freq: Every day | ORAL | 0 refills | Status: AC

## 2020-03-11 MED ORDER — ATORVASTATIN CALCIUM 40 MG PO TABS: 40.0000 mg | ORAL_TABLET | Freq: Every day | ORAL | 0 refills | Status: DC

## 2020-03-11 MED ORDER — PANTOPRAZOLE SODIUM 40 MG PO TBEC: 40.0000 mg | DELAYED_RELEASE_TABLET | Freq: Every day | ORAL | 0 refills | Status: DC

## 2020-03-11 NOTE — Discharge Summary (Addendum)
Physician Discharge Summary  Leroy [name] [id number] DOB: 1930-10-24 DOA: 03/10/2020  PCP: Idelle Crouch, MD  Admit date: 03/10/2020 Discharge date: 03/11/2020  Admitted From: Home  Disposition:  Home   Recommendations for Outpatient Follow-up and new medication changes:  1. Follow up with Dr. Doy Hutching in 7 days.  2. Patient has been placed on aspirin and clopidogrel per neurology recommendations.  3. Added pantoprazole for GI prophylaxis.  4. Patient will continue aspirin and clopidogrel for 3 weeks, then continue clopidogrel alone.  5. Late entry: echocardiogram with preserved LV systolic function and no signs of embolic source.   Home Health: no   Equipment/Devices: no    Discharge Condition: stable  CODE STATUS: full  Diet recommendation: heart healthy   Brief/Interim Summary: Patient admitted to the hospital with a working diagnosis of TIA intracranial arterial atherosclerosis.   86 year old male who presented with blurred vision. He does have significant past medical history for dementia, hypertension, dyslipidemia, chronic kidney disease stage IIIa and history of intracerebral hemorrhage 2017. Patient reported blurry vision that started on the morning of the day of admission, intermittent episodes, about 1 minute in duration, associated with dizziness.  On his initial physical examination blood pressure 209/115, 180/88, heart rate 58, respiratory rate 13, temperature 98.7, oxygen saturation 98%.  He was awake and alert, nonfocal, lungs clear to auscultation bilaterally, heart S1-S2 present and rhythmic, soft abdomen, no lower extremity edema.  Further work-up with brain MRI negative for acute infarct, CT angiography head and neck showed multifocal high-grade stenosis within the V4 right vertebral artery along with probably proximal right PICA high-grade stenosis.  Patient has been placed on dual antiplatelet therapy and continue blood pressure and lipid management.   1.  TIA, in the setting of intracranial atherosclerosis. Patient was admitted to the medical ward, he was placed on double antiplatelet therapy with aspirin and clopidogrel. Continue blood pressure control with metoprolol.  Further work-up with: CT angiography with multifocal high-grade stenosis within the V4 right vertebral artery, suspected high-grade stenosis proximal right PICA, moderate focal stenosis proximal to mid M2 left MCA.  Brain MRI with chronic lacunar infarcts within the left thalamocapsular junction and right cerebellum. Supratentorial and infratentorial chronic microhemorrhages, suggestive hypertensive microangiopathy.  Echocardiogram still pending. Patient will continue dual antiplatelet therapy for 3 weeks, then continue clopidogrel alone.   2. Uncontrolled hypertension. Patient will continue blood pressure control with metoprolol. His discharge blood pressure systolic is 123XX123 mmHg.   3. Dyslipidemia. Will continue with atorvastatin. LDL 86, Cholesterol 165 and triglycerides 80.   4. CKD stage 3a. His renal function remained stable, will continue blood pressure control.   5. Chronic anemia. Hgb and Hct remained stable.   6. Dementia. No confusion or agitation.     Discharge Diagnoses:  Principal Problem:   TIA (transient ischemic attack) Active Problems:   Hyperlipidemia   HTN (hypertension)    Discharge Instructions   Allergies as of 03/11/2020      Reactions   Aspirin Other (See Comments)   He has bleeding ulcers      Medication List    TAKE these medications   acetaminophen 500 MG tablet Commonly known as: TYLENOL Take 500 mg by mouth daily as needed for moderate pain or headache.   aspirin 81 MG EC tablet Take 1 tablet (81 mg total) by mouth daily for 21 days.   atorvastatin 40 MG tablet Commonly known as: LIPITOR Take 1 tablet (40 mg total) by mouth at bedtime. What changed:  when to take this   cholecalciferol 1000 units tablet Commonly known  as: VITAMIN D Take 2,000 Units by mouth daily.   clopidogrel 75 MG tablet Commonly known as: PLAVIX Take 1 tablet (75 mg total) by mouth daily.   metoprolol succinate 25 MG 24 hr tablet Commonly known as: TOPROL-XL Take 12.5 mg by mouth in the morning.   pantoprazole 40 MG tablet Commonly known as: PROTONIX Take 1 tablet (40 mg total) by mouth daily.       Allergies  Allergen Reactions  . Aspirin Other (See Comments)    He has bleeding ulcers    Consultations:  Neurology    Procedures/Studies: CT Angio Head W or Wo Contrast  Result Date: 03/10/2020 CLINICAL DATA:  Transient dizziness and blurry vision. Additional provided: Transient dizziness and blurred vision since Wednesday. EXAM: CT ANGIOGRAPHY HEAD AND NECK TECHNIQUE: Multidetector CT imaging of the head and neck was performed using the standard protocol during bolus administration of intravenous contrast. Multiplanar CT image reconstructions and MIPs were obtained to evaluate the vascular anatomy. Carotid stenosis measurements (when applicable) are obtained utilizing NASCET criteria, using the distal internal carotid diameter as the denominator. CONTRAST:  92mL OMNIPAQUE IOHEXOL 350 MG/ML SOLN COMPARISON:  Brain MRI and head CT performed earlier the same day 03/10/2020 FINDINGS: CTA NECK FINDINGS Aortic arch: Standard aortic branching. Atherosclerotic plaque within the visualized aortic arch and proximal major branch vessels of the neck. No hemodynamically significant innominate or proximal subclavian artery stenosis. Right carotid system: CCA and ICA patent within the neck without significant stenosis (50% or greater). Mild calcified plaque within the CCA, carotid bifurcation and proximal ICA. Left carotid system: CCA and ICA patent within the neck without significant stenosis (50% or greater). Mild to moderate mixed plaque within the distal CCA, carotid bifurcation and proximal ICA. Vertebral arteries: Mixed plaque at the  origin the right vertebral artery. There is little, if any, opacification of the V1 and V2 right vertebral artery to the C2 level. There is reconstitution at the C2 and more cranial levels. The dominant left vertebral artery is patent throughout the neck without significant stenosis. Minimal calcified plaque at the origin of this vessel. Skeleton: No acute bony abnormality or aggressive osseous lesion. Cervical spondylosis with multilevel disc degeneration, posterior disc osteophytes, uncovertebral and facet hypertrophy. Prominent multilevel cervical anterior osteophytes, most notably at C3-C4. Multilevel degenerative osseous fusion Other neck: No neck mass or cervical lymphadenopathy. Subcentimeter right thyroid lobe nodule not meeting consensus criteria for ultrasound follow-up. Upper chest: No consolidation within the imaged lung apices. Review of the MIP images confirms the above findings CTA HEAD FINDINGS Anterior circulation: The intracranial internal carotid arteries are patent. Mild calcified plaque within these vessels without significant stenosis. The M1 middle cerebral arteries are patent without significant stenosis. No M2 proximal branch occlusion is identified. Moderate focal stenosis within a proximal to mid M2 left MCA branch vessel (series 12, image 24). The anterior cerebral arteries are patent without significant proximal stenosis. There is a 1-2 mm inferiorly projecting vascular protrusion arising from the supraclinoid right ICA which may reflect an infundibulum or tiny aneurysm (series 9, image 75). Posterior circulation: Multifocal high-grade stenoses within the V4 right vertebral artery. The proximal right PICA is poorly delineated suggestive of high-grade stenosis within this vessel. The intracranial left vertebral artery is patent without significant stenosis, as is the basilar artery. The posterior cerebral arteries are patent bilaterally without significant proximal stenosis.  Atherosclerotic irregularity of the P3 and more distal posterior  cerebral arteries. Posterior communicating arteries are poorly delineated and may be hypoplastic or absent bilaterally. Venous sinuses: Within limitations of contrast timing, no convincing thrombus. Anatomic variants: As described. Review of the MIP images confirms the above findings IMPRESSION: CTA neck: 1. There is little, if any, opacification of the right vertebral artery to the C2 level. Findings may be secondary to atherosclerotic disease. Dissection cannot be excluded. There is reconstitution of flow at the C2 and more cranial levels. Multifocal high-grade stenoses within the V4 right vertebral artery. Additionally, the proximal right PICA is poorly delineated suggestive of high-grade stenosis. 2. The bilateral common and internal carotid arteries are patent within the neck without hemodynamically significant stenosis. Atherosclerotic plaque within the carotid systems as described. CTA head: 1. Multifocal high-grade stenoses within the V4 right vertebral artery and suspected high-grade stenosis of the proximal right PICA as described above. 2. Moderate focal stenosis within a proximal-to-mid M2 left MCA branch vessel. 3. No other significant proximal intracranial arterial stenosis is identified. 4. 1-2 mm infundibulum versus tiny aneurysm arising from the supraclinoid right ICA. Electronically Signed   By: Kellie Simmering DO   On: 03/10/2020 16:24   CT Head Wo Contrast  Result Date: 03/10/2020 CLINICAL DATA:  Blurry vision, history of hemorrhage. Additional history provided: Blurred vision and dizziness onset this morning. EXAM: CT HEAD WITHOUT CONTRAST TECHNIQUE: Contiguous axial images were obtained from the base of the skull through the vertex without intravenous contrast. COMPARISON:  Brain MRI 09/01/2017, head CT 01/06/2016 FINDINGS: Brain: Ill-defined hypoattenuation within the cerebral white matter is nonspecific, but consistent with  chronic small vessel ischemic disease. Known small chronic lacunar infarct within the left thalamus/internal capsule better appreciated on prior MRI 09/01/2017. Stable, moderate generalized parenchymal atrophy. There is no acute intracranial hemorrhage. No demarcated cortical infarct. No extra-axial fluid collection. No evidence of intracranial mass. No midline shift. Vascular: No hyperdense vessel.  Atherosclerotic calcifications Skull: Normal. Negative for fracture or focal lesion. Sinuses/Orbits: Visualized orbits show no acute finding. Mild ethmoid sinus mucosal thickening. No significant mastoid effusion. IMPRESSION: 1. No evidence of acute intracranial abnormality. 2. Moderate generalized parenchymal atrophy with chronic small vessel ischemic disease. Known remote left thalamic/internal capsule lacunar infarct. 3. Mild ethmoid sinus mucosal thickening. Electronically Signed   By: Kellie Simmering DO   On: 03/10/2020 12:55   CT Angio Neck W and/or Wo Contrast  Result Date: 03/10/2020 CLINICAL DATA:  Transient dizziness and blurry vision. Additional provided: Transient dizziness and blurred vision since Wednesday. EXAM: CT ANGIOGRAPHY HEAD AND NECK TECHNIQUE: Multidetector CT imaging of the head and neck was performed using the standard protocol during bolus administration of intravenous contrast. Multiplanar CT image reconstructions and MIPs were obtained to evaluate the vascular anatomy. Carotid stenosis measurements (when applicable) are obtained utilizing NASCET criteria, using the distal internal carotid diameter as the denominator. CONTRAST:  75mL OMNIPAQUE IOHEXOL 350 MG/ML SOLN COMPARISON:  Brain MRI and head CT performed earlier the same day 03/10/2020 FINDINGS: CTA NECK FINDINGS Aortic arch: Standard aortic branching. Atherosclerotic plaque within the visualized aortic arch and proximal major branch vessels of the neck. No hemodynamically significant innominate or proximal subclavian artery stenosis.  Right carotid system: CCA and ICA patent within the neck without significant stenosis (50% or greater). Mild calcified plaque within the CCA, carotid bifurcation and proximal ICA. Left carotid system: CCA and ICA patent within the neck without significant stenosis (50% or greater). Mild to moderate mixed plaque within the distal CCA, carotid bifurcation and proximal ICA. Vertebral  arteries: Mixed plaque at the origin the right vertebral artery. There is little, if any, opacification of the V1 and V2 right vertebral artery to the C2 level. There is reconstitution at the C2 and more cranial levels. The dominant left vertebral artery is patent throughout the neck without significant stenosis. Minimal calcified plaque at the origin of this vessel. Skeleton: No acute bony abnormality or aggressive osseous lesion. Cervical spondylosis with multilevel disc degeneration, posterior disc osteophytes, uncovertebral and facet hypertrophy. Prominent multilevel cervical anterior osteophytes, most notably at C3-C4. Multilevel degenerative osseous fusion Other neck: No neck mass or cervical lymphadenopathy. Subcentimeter right thyroid lobe nodule not meeting consensus criteria for ultrasound follow-up. Upper chest: No consolidation within the imaged lung apices. Review of the MIP images confirms the above findings CTA HEAD FINDINGS Anterior circulation: The intracranial internal carotid arteries are patent. Mild calcified plaque within these vessels without significant stenosis. The M1 middle cerebral arteries are patent without significant stenosis. No M2 proximal branch occlusion is identified. Moderate focal stenosis within a proximal to mid M2 left MCA branch vessel (series 12, image 24). The anterior cerebral arteries are patent without significant proximal stenosis. There is a 1-2 mm inferiorly projecting vascular protrusion arising from the supraclinoid right ICA which may reflect an infundibulum or tiny aneurysm (series 9,  image 75). Posterior circulation: Multifocal high-grade stenoses within the V4 right vertebral artery. The proximal right PICA is poorly delineated suggestive of high-grade stenosis within this vessel. The intracranial left vertebral artery is patent without significant stenosis, as is the basilar artery. The posterior cerebral arteries are patent bilaterally without significant proximal stenosis. Atherosclerotic irregularity of the P3 and more distal posterior cerebral arteries. Posterior communicating arteries are poorly delineated and may be hypoplastic or absent bilaterally. Venous sinuses: Within limitations of contrast timing, no convincing thrombus. Anatomic variants: As described. Review of the MIP images confirms the above findings IMPRESSION: CTA neck: 1. There is little, if any, opacification of the right vertebral artery to the C2 level. Findings may be secondary to atherosclerotic disease. Dissection cannot be excluded. There is reconstitution of flow at the C2 and more cranial levels. Multifocal high-grade stenoses within the V4 right vertebral artery. Additionally, the proximal right PICA is poorly delineated suggestive of high-grade stenosis. 2. The bilateral common and internal carotid arteries are patent within the neck without hemodynamically significant stenosis. Atherosclerotic plaque within the carotid systems as described. CTA head: 1. Multifocal high-grade stenoses within the V4 right vertebral artery and suspected high-grade stenosis of the proximal right PICA as described above. 2. Moderate focal stenosis within a proximal-to-mid M2 left MCA branch vessel. 3. No other significant proximal intracranial arterial stenosis is identified. 4. 1-2 mm infundibulum versus tiny aneurysm arising from the supraclinoid right ICA. Electronically Signed   By: Kellie Simmering DO   On: 03/10/2020 16:24   MR BRAIN WO CONTRAST  Result Date: 03/10/2020 CLINICAL DATA:  Transient dizziness and blurry vision.  EXAM: MRI HEAD WITHOUT CONTRAST TECHNIQUE: Multiplanar, multiecho pulse sequences of the brain and surrounding structures were obtained without intravenous contrast. COMPARISON:  Head CT 03/10/2020, brain MRI 09/01/2017 FINDINGS: Brain: Moderate scattered T2/FLAIR hyperintensity within the cerebral white matter is nonspecific, but consistent with chronic small vessel ischemic disease. Findings have not significantly changed since MRI 09/01/2017. Redemonstrated chronic lacunar infarcts within the left thalamocapsular junction and right cerebellar hemisphere. Multiple supratentorial and infratentorial chronic microhemorrhages within the cerebral hemispheres, basal ganglia, left thalamus and cerebellum. Stable, moderate generalized parenchymal atrophy. There is no acute  infarct. No evidence of intracranial mass. No extra-axial fluid collection. No midline shift. Vascular: Loss of the expected flow void within portions of the V4 right vertebral artery (series 10, image 1). Skull and upper cervical spine: No focal marrow lesion. Sinuses/Orbits: Bilateral lens replacements. Small right maxillary sinus mucous retention cyst. Mild ethmoid sinus mucosal thickening. IMPRESSION: 1. No evidence of acute infarct. 2. Signal abnormality within portions of the V4 right vertebral artery suggestive of high-grade stenosis or occlusion. Consider CTA or MRA for further evaluation. 3. Stable moderate generalized parenchymal and chronic small vessel ischemic disease. 4. Redemonstrated chronic lacunar infarcts within the left thalamocapsular junction and right cerebellum. 5. 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 Electronically Signed   By: Kellie Simmering DO   On: 03/10/2020 15:12       Subjective: Patient with no further blurry vision, no focal neuro deficits, no dyspnea or chest pain, no nausea or vomiting.    Discharge Exam: Vitals:   03/11/20 0840 03/11/20 1343  BP: 136/79 126/64  Pulse: 63 65  Resp: 13 16  Temp: 97.7 F (36.5 C) 98.4 F (36.9 C)  SpO2: 98% 98%   Vitals:   03/10/20 2308 03/11/20 0835 03/11/20 0840 03/11/20 1343  BP: (!) 159/90 136/79 136/79 126/64  Pulse: 75 65 63 65  Resp: 12 16 13 16   Temp: 99.3 F (37.4 C) 97.7 F (36.5 C) 97.7 F (36.5 C) 98.4 F (36.9 C)  TempSrc: Oral  Oral   SpO2: 98% 98% 98% 98%  Weight: 95.4 kg     Height: 6\' 2"  (1.88 m)       General: Not in pain or dyspnea.  Neurology: Awake and alert, non focal  E ENT: no pallor, no icterus, oral mucosa moist Cardiovascular: No JVD. S1-S2 present, rhythmic, no gallops, rubs, or murmurs. No lower extremity edema. Pulmonary: positive breath sounds bilaterally, adequate air movement, no wheezing, rhonchi or rales. Gastrointestinal. Abdomen with no organomegaly, non tender, no rebound or guarding Skin. No rashes Musculoskeletal: no joint deformities   The results of significant diagnostics from this hospitalization (including imaging, microbiology, ancillary and laboratory) are listed below for reference.     Microbiology: Recent Results (from the past 240 hour(s))  SARS CORONAVIRUS 2 (TAT 6-24 HRS) Nasopharyngeal Nasopharyngeal Swab     Status: None   Collection Time: 03/10/20  6:00 PM   Specimen: Nasopharyngeal Swab  Result Value Ref Range Status   SARS Coronavirus 2 NEGATIVE NEGATIVE Final    Comment: (NOTE) SARS-CoV-2 target nucleic acids are NOT DETECTED. The SARS-CoV-2 RNA is generally detectable in upper and lower respiratory specimens during the acute phase of infection. Negative results do not preclude SARS-CoV-2 infection, do not rule out co-infections with other pathogens, and should not be used as the sole basis for treatment or other patient management decisions. Negative results must be combined with clinical observations, patient history, and epidemiological information.  The expected result is Negative. Fact Sheet for Patients: SugarRoll.be Fact Sheet for Healthcare Providers: https://www.woods-mathews.com/ This test is not yet approved or cleared by the [place] FDA and  has been authorized for detection and/or diagnosis of SARS-CoV-2 by FDA under an Emergency Use Authorization (EUA). This EUA will remain  in effect (meaning this test can be used) for the duration of the COVID-19 declaration under Section 56 4(b)(1) of the Act, 21 U.S.C. section 360bbb-3(b)(1), unless the authorization is terminated or revoked sooner. Performed at [facility],  [address] [place], [place] [address]      Labs: BNP (last 3 results) No results for input(s): BNP in the last 8760 hours. Basic Metabolic Panel: Recent Labs  Lab 03/10/20 1246 03/10/20 1257 03/11/20 0217  NA 142 140 138  K 4.2 3.7 3.6  CL 107 103 103  CO2 25  --  24  GLUCOSE 97 94 92  BUN 18 21 23   CREATININE 1.36* 1.40* 1.59*  CALCIUM 8.9  --  9.1  MG  --   --  1.8   Liver Function Tests: Recent Labs  Lab 03/10/20 1246 03/11/20 0217  AST 21 18  ALT 15 14  ALKPHOS 73 65  BILITOT 1.0 0.9  PROT 6.8 6.4*  ALBUMIN 3.4* 3.3*   No results for input(s): LIPASE, AMYLASE in the last 168 hours. No results for input(s): AMMONIA in the last 168 hours. CBC: Recent Labs  Lab 03/10/20 1246 03/10/20 1257 03/11/20 0217  WBC 7.7  --  9.1  NEUTROABS 4.6  --  5.2  HGB 12.7* 12.2* 12.3*  HCT 38.4* 36.0* 36.9*  MCV 91.6  --  90.2  PLT 242  --  233   Cardiac Enzymes: No results for input(s): CKTOTAL, CKMB, CKMBINDEX, TROPONINI in the last 168 hours. BNP: Invalid input(s): POCBNP CBG: No results for input(s): GLUCAP in the last 168 hours. D-Dimer No results for input(s): DDIMER in the last 72 hours. Hgb A1c Recent Labs    03/11/20 0217  HGBA1C 5.8*   Lipid Profile Recent Labs    03/11/20 0217  CHOL 165  HDL 63  LDLCALC 86   TRIG 80  CHOLHDL 2.6   Thyroid function studies No results for input(s): TSH, T4TOTAL, T3FREE, THYROIDAB in the last 72 hours.  Invalid input(s): FREET3 Anemia work up No results for input(s): VITAMINB12, FOLATE, FERRITIN, TIBC, IRON, RETICCTPCT in the last 72 hours. Urinalysis    Component Value Date/Time   COLORURINE STRAW (A) 03/10/2020 1250   APPEARANCEUR CLEAR 03/10/2020 1250   LABSPEC 1.009 03/10/2020 1250   PHURINE 6.0 03/10/2020 1250   GLUCOSEU NEGATIVE 03/10/2020 1250   HGBUR SMALL (A) 03/10/2020 1250   BILIRUBINUR NEGATIVE 03/10/2020 1250   KETONESUR NEGATIVE 03/10/2020 1250   PROTEINUR NEGATIVE 03/10/2020 1250   NITRITE NEGATIVE 03/10/2020 1250   LEUKOCYTESUR NEGATIVE 03/10/2020 1250   Sepsis Labs Invalid input(s): PROCALCITONIN,  WBC,  LACTICIDVEN Microbiology Recent Results (from the past 240 hour(s))  SARS CORONAVIRUS 2 (TAT 6-24 HRS) Nasopharyngeal Nasopharyngeal Swab     Status: None   Collection Time: 03/10/20  6:00 PM   Specimen: Nasopharyngeal Swab  Result Value Ref Range Status   SARS Coronavirus 2 NEGATIVE NEGATIVE Final    Comment: (NOTE) SARS-CoV-2 target nucleic acids are NOT DETECTED. The SARS-CoV-2 RNA is generally detectable in upper and lower respiratory specimens during the acute phase of infection. Negative results do not preclude SARS-CoV-2 infection, do not rule out co-infections with other pathogens, and should not be used as the sole basis for treatment or other patient management decisions. Negative results must be combined with clinical observations, patient history, and epidemiological information. The expected result is Negative. Fact Sheet for Patients: SugarRoll.be Fact Sheet for Healthcare Providers: https://www.woods-mathews.com/ This test is not yet approved or cleared by the [place] FDA and  has been authorized for detection and/or diagnosis of SARS-CoV-2 by FDA under an  Emergency Use Authorization (EUA). This EUA will remain  in effect (meaning this test can be used) for the duration  of the COVID-19 declaration under Section 56 4(b)(1) of the Act, 21 U.S.C. section 360bbb-3(b)(1), unless the authorization is terminated or revoked sooner. Performed at [facility], [place] [address], [place], [place] [address]      Time coordinating discharge: 45 minutes  SIGNED:   Tawni Millers, MD  [facility] 03/11/2020, 2:05 PM

## 2020-03-11 NOTE — Progress Notes (Signed)
Pt given discharge instructions with understanding. Pt's daughter at bedside. Pt has no questions at this time. IV and monitor d/c. Pt does not want to wait for the stroke information book to arrive from pharmacy. Pt taken out via wheelchair.

## 2020-03-11 NOTE — Progress Notes (Signed)
OT Evaluation:  This 86 y/o male presents with the below. PTA pt reports independence with ADL and functional mobility, completing household iADL tasks including lawn care. Today, pt completing room level mobility without AD and ADL tasks at supervision level. Pt reports intermittent blurred vision but reports has improved since initial admit. He reports he lives with spouse, daughter present and supportive during session as well. Pt will benefit from continued acute OT services to maximize his overall safety and independence with ADL and mobility; do not anticipate pt will require follow up OT services after discharge.    03/11/20 0800  OT Visit Information  Last OT Received On 03/11/20  Assistance Needed +1  History of Present Illness Pt is an 86 y.o. male with a PMHx of dementia, CKD, HLD, HTN, intraventricular conduction defect, PVD, prior stroke (ICH) in 2017, tremor and hx of lumbar laminectomy, who presented to the ED on 5/9 via EMS after onset of blurred vision and dizziness. 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 Chronic microhemorrhages and chronic lacunar infarctions were also noted.   Precautions  Precautions Fall  Restrictions  Weight Bearing Restrictions No  Home Living  Family/patient expects to be discharged to: Private residence  Living Arrangements Spouse/significant other  Available Help at Discharge Family  Type of [place] to enter  Entrance Stairs-Number of Steps 3  Entrance Stairs-Rails Right  Lawnside One level;Laundry or work area in basement (flight of steps to basement (16) / R side handrail)  Company secretary (one of each)  [place] held Tourist information centre manager - 2 wheels  Prior Function  Level of Independence Independent  Comments mows lawn   Communication  Communication HOH  Pain Assessment  Pain  Assessment No/denies pain  Cognition  Arousal/Alertness Awake/alert  Behavior During Therapy WFL for tasks assessed/performed  Overall Cognitive Status History of cognitive impairments - at baseline  General Comments hx of dementia per chart, pt with some memory deficits noted, requires increased time to answer home setup/PLOF but appears accurate (dtr in room and confirming accuracy)   Upper Extremity Assessment  Upper Extremity Assessment Overall WFL for tasks assessed  Lower Extremity Assessment  Lower Extremity Assessment Defer to PT evaluation  Cervical / Trunk Assessment  Cervical / Trunk Assessment Normal  ADL  Overall ADL's  Needs assistance/impaired  Eating/Feeding Modified independent;Sitting  Eating/Feeding Details (indicate cue type and reason) breakfast tray arriving and placed in front of pt end of session  Grooming Wash/dry hands;Supervision/safety;Standing  Upper Body Bathing Supervision/ safety;Sitting  Lower Body Bathing Supervison/ safety;Sit to/from stand  Upper Body Dressing  Modified independent;Sitting  Lower Body Dressing Supervision/safety;Sit to/from stand  Lower Body Dressing Details (indicate cue type and reason) pt using figure 4 to adjust socks while seated EOB, supervision for sit<>stand and standing balance  Toilet Transfer Supervision/safety;Ambulation  Toilet Transfer Details (indicate cue type and reason) simulated via transfer to recliner, room level mobility   Toileting- Clothing Manipulation and Hygiene Supervision/safety;Sit to/from stand  Functional mobility during ADLs Supervision/safety  Vision- History  Baseline Vision/History Wears glasses  Wears Glasses Reading only  Patient Visual Report Blurring of vision (intermittent)  Vision- Assessment  Vision Assessment? Yes  Eye Alignment [facility]  Ocular Range of Motion [facility]  Tracking/Visual Pursuits Decreased smoothness of horizontal tracking;Decreased smoothness of vertical tracking;Impaired - to be  further tested in functional context  Additional Comments pt able to  read clock in room and large font on board - difficulty reading smaller font   Bed Mobility  Overal bed mobility Needs Assistance  Bed Mobility Supine to Sit  Supine to sit Supervision  General bed mobility comments for safety  Transfers  Overall transfer level Needs assistance  Equipment used None  Transfers Sit to/from Stand  Sit to Stand Supervision  General transfer comment for safety and balance, no assist required  Balance  Overall balance assessment Mild deficits observed, not formally tested  General Comments  General comments (skin integrity, edema, etc.) educated pt/pt's daughter re: signs/symptoms of stroke including BEFAST acronym   OT - End of Session  Equipment Utilized During Treatment Gait belt  Activity Tolerance Patient tolerated treatment well  Patient left in chair;with call bell/phone within reach;with family/visitor present  Nurse Communication Mobility status  OT Assessment  OT Recommendation/Assessment Patient needs continued OT Services  OT Visit Diagnosis Other abnormalities of gait and mobility (R26.89);Other symptoms and signs involving the nervous system (R29.898);Other symptoms and signs involving cognitive function  OT Problem List Decreased strength;Decreased range of motion;Decreased activity tolerance;Impaired balance (sitting and/or standing);Decreased knowledge of use of DME or AE;Impaired vision/perception;Decreased cognition  OT Plan  OT Frequency (ACUTE ONLY) Min 2X/week  OT Treatment/Interventions (ACUTE ONLY) Self-care/ADL training;Therapeutic exercise;Energy conservation;DME and/or AE instruction;Therapeutic activities;Visual/perceptual remediation/compensation;Patient/family education;Balance training;Cognitive remediation/compensation  AM-PAC OT "6 Clicks" Daily Activity Outcome Measure (Version 2)  Help from another person eating meals? 4  Help from another person taking  care of personal grooming? 3  Help from another person toileting, which includes using toliet, bedpan, or urinal? 3  Help from another person bathing (including washing, rinsing, drying)? 3  Help from another person to put on and taking off regular upper body clothing? 4  Help from another person to put on and taking off regular lower body clothing? 4  6 Click Score 21  OT Recommendation  Follow Up Recommendations No OT follow up;Supervision/Assistance - 24 hour (24hr initially)  OT Equipment None recommended by OT  Individuals Consulted  Consulted and Agree with Results and Recommendations Patient  Acute Rehab OT Goals  Patient Stated Goal home when able  OT Goal Formulation With patient  Time For Goal Achievement 03/25/20  Potential to Achieve Goals Good  OT Time Calculation  OT Start Time (ACUTE ONLY) 0847  OT Stop Time (ACUTE ONLY) 0914  OT Time Calculation (min) 27 min  OT General Charges  $OT Visit 1 Visit  OT Evaluation  $OT Eval Moderate Complexity 1 Mod  OT Treatments  $Self Care/Home Management  8-22 mins  Written Expression  Dominant Hand Right   Lou Cal, OT Acute Rehabilitation Services Pager [phone] Office [phone]

## 2020-03-11 NOTE — Progress Notes (Signed)
Physical Therapy Evaluation Patient Details Name: Leroy [name] MRN: [id number] DOB: 01-Dec-1929 Today's Date: 03/11/2020   History of Present Illness  Pt is an 86 y.o. male with a PMHx of dementia, CKD, HLD, HTN, intraventricular conduction defect, PVD, prior stroke (ICH) in 2017, tremor and hx of lumbar laminectomy, who presented to the ED on 5/9 via EMS after onset of blurred vision and dizziness. 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 Chronic microhemorrhages and chronic lacunar infarctions were also noted.   Clinical Impression  Pt is at or close to baseline functioning and should be safe at home with available assist of his wife. There are no further acute PT needs.  Will sign off at this time.     Follow Up Recommendations No PT follow up    Equipment Recommendations  None recommended by PT    Recommendations for Other Services       Precautions / Restrictions Precautions Precautions: Fall Restrictions Weight Bearing Restrictions: No      Mobility  Bed Mobility Overal bed mobility: Needs Assistance Bed Mobility: Supine to Sit     Supine to sit: Supervision     General bed mobility comments: for safety  Transfers Overall transfer level: Needs assistance Equipment used: None Transfers: Sit to/from Stand Sit to Stand: Supervision         General transfer comment: for safety and balance, no assist required  Ambulation/Gait Ambulation/Gait assistance: Supervision;Modified independent (Device/Increase time) Gait Distance (Feet): 400 Feet Assistive device: None Gait Pattern/deviations: Step-through pattern Gait velocity: age approp Gait velocity interpretation: >2.62 ft/sec, indicative of community ambulatory General Gait Details: short, somewhat flat-footed steps, left foot appeard to roll out more without shoes than with.  minor deviation noted, but pt able to easily recover.  Stairs Stairs:  Yes Stairs assistance: Supervision Stair Management: One rail Right;Two rails;Alternating pattern;Sideways;Forwards Number of Stairs: 5 General stair comments: safe with the rail  Wheelchair Mobility    Modified Rankin (Stroke Patients Only) Modified Rankin (Stroke Patients Only) Pre-Morbid Rankin Score: No symptoms Modified Rankin: No significant disability     Balance Overall balance assessment: Mild deficits observed, not formally tested                               Standardized Balance Assessment Standardized Balance Assessment : Dynamic Gait Index   Dynamic Gait Index Level Surface: Mild Impairment Change in Gait Speed: Normal Gait with Horizontal Head Turns: Mild Impairment Gait with Vertical Head Turns: Normal Gait and Pivot Turn: Normal Step Over Obstacle: Mild Impairment Step Around Obstacles: Mild Impairment Steps: Mild Impairment Total Score: 19       Pertinent Vitals/Pain Pain Assessment: No/denies pain    Home Living Family/patient expects to be discharged to:: Private residence Living Arrangements: Spouse/significant other Available Help at Discharge: Family Type of Home: House Home Access: Stairs to enter Entrance Stairs-Rails: Right Entrance Stairs-Number of Steps: 3 Home Layout: One level;Laundry or work area in basement(flight of steps to basement (16) / R side handrail) Home Equipment: Shower seat;Hand held Tourist information centre manager - 2 wheels      Prior Function Level of Independence: Independent         Comments: mows lawn      Hand Dominance   Dominant Hand: Right    Extremity/Trunk Assessment   Upper Extremity Assessment Upper Extremity Assessment: Overall WFL for tasks assessed    Lower Extremity Assessment  Lower Extremity Assessment: Overall WFL for tasks assessed    Cervical / Trunk Assessment Cervical / Trunk Assessment: Normal  Communication   Communication: HOH  Cognition Arousal/Alertness:  Awake/alert Behavior During Therapy: WFL for tasks assessed/performed Overall Cognitive Status: History of cognitive impairments - at baseline                                 General Comments: hx of dementia per chart, pt with some memory deficits noted, requires increased time to answer home setup/PLOF but appears accurate (dtr in room and confirming accuracy)       General Comments General comments (skin integrity, edema, etc.): educated pt/pt's daughter re: signs/symptoms of stroke including BEFAST acronym     Exercises     Assessment/Plan    PT Assessment Patent does not need any further PT services  PT Problem List         PT Treatment Interventions      PT Goals (Current goals can be found in the Care Plan section)  Acute Rehab PT Goals Patient Stated Goal: home when able PT Goal Formulation: All assessment and education complete, DC therapy Potential to Achieve Goals: Good    Frequency     Barriers to discharge        Co-evaluation               AM-PAC PT "6 Clicks" Mobility  Outcome Measure Help needed turning from your back to your side while in a flat bed without using bedrails?: None Help needed moving from lying on your back to sitting on the side of a flat bed without using bedrails?: None Help needed moving to and from a bed to a chair (including a wheelchair)?: None Help needed standing up from a chair using your arms (e.g., wheelchair or bedside chair)?: None Help needed to walk in hospital room?: None Help needed climbing 3-5 steps with a railing? : None 6 Click Score: 24    End of Session   Activity Tolerance: Patient tolerated treatment well Patient left: in bed;with call bell/phone within reach;Other (comment);with family/visitor present(sitting EOB) Nurse Communication: Mobility status PT Visit Diagnosis: Unsteadiness on feet (R26.81)    Time: 1206-1228 PT Time Calculation (min) (ACUTE ONLY): 22 min   Charges:   PT  Evaluation $PT Eval Low Complexity: 1 Low          03/11/2020  Ginger Carne., PT Acute Rehabilitation Services [phone]  (pager) [phone]  (office)  Tessie Fass Jora Galluzzo 03/11/2020, 12:34 PM

## 2020-03-11 NOTE — Social Work (Signed)
CSW met with pt at bedside. CSW introduced self and explained her role. CSW completed sbirt with pt.  Pt scored a 0 on the sbirt scale. Pt denied alcohol use. Pt denied substance use. Pt did not need resources at this time.  [name], Latanya Presser, [place] Social Worker [phone]

## 2020-03-11 NOTE — Progress Notes (Signed)
STROKE TEAM PROGRESS NOTE   INTERVAL HISTORY  Patient's wife and daughter are at the bedside.  He began with transient blurred vision, dizziness and gait ataxia few days ago and transient recurrence.  He denies any headache, double vision, focal extremity weakness or numbness.  MRI scan shows no acute infarct but does show high-grade distal right vertebral artery stenosis versus occlusion.  CT angiogram of the neck shows significant atherosclerotic changes of terminal right vertebral artery with high-grade stenosis of right PICA.  LDL cholesterol is 86 mg percent.  Hemoglobin A1c is 5.8.  Transthoracic echo is pending  Vitals:   03/10/20 2130 03/10/20 2200 03/10/20 2308 03/11/20 0840  BP: (!) 152/95 (!) 157/96 (!) 159/90 136/79  Pulse: 73 71 75 63  Resp: 14 17 12 13   Temp:   99.3 F (37.4 C) 97.7 F (36.5 C)  TempSrc:   Oral Oral  SpO2: 96% 95% 98% 98%  Weight:   95.4 kg   Height:   6\' 2"  (1.88 m)     CBC:  Recent Labs  Lab 03/10/20 1246 03/10/20 1246 03/10/20 1257 03/11/20 0217  WBC 7.7  --   --  9.1  NEUTROABS 4.6  --   --  5.2  HGB 12.7*   < > 12.2* 12.3*  HCT 38.4*   < > 36.0* 36.9*  MCV 91.6  --   --  90.2  PLT 242  --   --  233   < > = values in this interval not displayed.    Basic Metabolic Panel:  Recent Labs  Lab 03/10/20 1246 03/10/20 1246 03/10/20 1257 03/11/20 0217  NA 142   < > 140 138  K 4.2   < > 3.7 3.6  CL 107   < > 103 103  CO2 25  --   --  24  GLUCOSE 97   < > 94 92  BUN 18   < > 21 23  CREATININE 1.36*   < > 1.40* 1.59*  CALCIUM 8.9  --   --  9.1  MG  --   --   --  1.8   < > = values in this interval not displayed.   Lipid Panel:     Component Value Date/Time   CHOL 165 03/11/2020 0217   TRIG 80 03/11/2020 0217   HDL 63 03/11/2020 0217   CHOLHDL 2.6 03/11/2020 0217   VLDL 16 03/11/2020 0217   LDLCALC 86 03/11/2020 0217   HgbA1c:  Lab Results  Component Value Date   HGBA1C 5.8 (H) 03/11/2020   Urine Drug Screen: No results found  for: LABOPIA, COCAINSCRNUR, LABBENZ, AMPHETMU, THCU, LABBARB  Alcohol Level     Component Value Date/Time   ETH <10 03/10/2020 1246    IMAGING past 24 hours CT Angio Head W or Wo Contrast  Result Date: 03/10/2020 CLINICAL DATA:  Transient dizziness and blurry vision. Additional provided: Transient dizziness and blurred vision since Wednesday. EXAM: CT ANGIOGRAPHY HEAD AND NECK TECHNIQUE: Multidetector CT imaging of the head and neck was performed using the standard protocol during bolus administration of intravenous contrast. Multiplanar CT image reconstructions and MIPs were obtained to evaluate the vascular anatomy. Carotid stenosis measurements (when applicable) are obtained utilizing NASCET criteria, using the distal internal carotid diameter as the denominator. CONTRAST:  39mL OMNIPAQUE IOHEXOL 350 MG/ML SOLN COMPARISON:  Brain MRI and head CT performed earlier the same day 03/10/2020 FINDINGS: CTA NECK FINDINGS Aortic arch: Standard aortic branching. Atherosclerotic plaque within the  visualized aortic arch and proximal major branch vessels of the neck. No hemodynamically significant innominate or proximal subclavian artery stenosis. Right carotid system: CCA and ICA patent within the neck without significant stenosis (50% or greater). Mild calcified plaque within the CCA, carotid bifurcation and proximal ICA. Left carotid system: CCA and ICA patent within the neck without significant stenosis (50% or greater). Mild to moderate mixed plaque within the distal CCA, carotid bifurcation and proximal ICA. Vertebral arteries: Mixed plaque at the origin the right vertebral artery. There is little, if any, opacification of the V1 and V2 right vertebral artery to the C2 level. There is reconstitution at the C2 and more cranial levels. The dominant left vertebral artery is patent throughout the neck without significant stenosis. Minimal calcified plaque at the origin of this vessel. Skeleton: No acute bony  abnormality or aggressive osseous lesion. Cervical spondylosis with multilevel disc degeneration, posterior disc osteophytes, uncovertebral and facet hypertrophy. Prominent multilevel cervical anterior osteophytes, most notably at C3-C4. Multilevel degenerative osseous fusion Other neck: No neck mass or cervical lymphadenopathy. Subcentimeter right thyroid lobe nodule not meeting consensus criteria for ultrasound follow-up. Upper chest: No consolidation within the imaged lung apices. Review of the MIP images confirms the above findings CTA HEAD FINDINGS Anterior circulation: The intracranial internal carotid arteries are patent. Mild calcified plaque within these vessels without significant stenosis. The M1 middle cerebral arteries are patent without significant stenosis. No M2 proximal branch occlusion is identified. Moderate focal stenosis within a proximal to mid M2 left MCA branch vessel (series 12, image 24). The anterior cerebral arteries are patent without significant proximal stenosis. There is a 1-2 mm inferiorly projecting vascular protrusion arising from the supraclinoid right ICA which may reflect an infundibulum or tiny aneurysm (series 9, image 75). Posterior circulation: Multifocal high-grade stenoses within the V4 right vertebral artery. The proximal right PICA is poorly delineated suggestive of high-grade stenosis within this vessel. The intracranial left vertebral artery is patent without significant stenosis, as is the basilar artery. The posterior cerebral arteries are patent bilaterally without significant proximal stenosis. Atherosclerotic irregularity of the P3 and more distal posterior cerebral arteries. Posterior communicating arteries are poorly delineated and may be hypoplastic or absent bilaterally. Venous sinuses: Within limitations of contrast timing, no convincing thrombus. Anatomic variants: As described. Review of the MIP images confirms the above findings IMPRESSION: CTA neck: 1.  There is little, if any, opacification of the right vertebral artery to the C2 level. Findings may be secondary to atherosclerotic disease. Dissection cannot be excluded. There is reconstitution of flow at the C2 and more cranial levels. Multifocal high-grade stenoses within the V4 right vertebral artery. Additionally, the proximal right PICA is poorly delineated suggestive of high-grade stenosis. 2. The bilateral common and internal carotid arteries are patent within the neck without hemodynamically significant stenosis. Atherosclerotic plaque within the carotid systems as described. CTA head: 1. Multifocal high-grade stenoses within the V4 right vertebral artery and suspected high-grade stenosis of the proximal right PICA as described above. 2. Moderate focal stenosis within a proximal-to-mid M2 left MCA branch vessel. 3. No other significant proximal intracranial arterial stenosis is identified. 4. 1-2 mm infundibulum versus tiny aneurysm arising from the supraclinoid right ICA. Electronically Signed   By: Kellie Simmering DO   On: 03/10/2020 16:24   CT Head Wo Contrast  Result Date: 03/10/2020 CLINICAL DATA:  Blurry vision, history of hemorrhage. Additional history provided: Blurred vision and dizziness onset this morning. EXAM: CT HEAD WITHOUT CONTRAST TECHNIQUE: Contiguous axial  images were obtained from the base of the skull through the vertex without intravenous contrast. COMPARISON:  Brain MRI 09/01/2017, head CT 01/06/2016 FINDINGS: Brain: Ill-defined hypoattenuation within the cerebral white matter is nonspecific, but consistent with chronic small vessel ischemic disease. Known small chronic lacunar infarct within the left thalamus/internal capsule better appreciated on prior MRI 09/01/2017. Stable, moderate generalized parenchymal atrophy. There is no acute intracranial hemorrhage. No demarcated cortical infarct. No extra-axial fluid collection. No evidence of intracranial mass. No midline shift.  Vascular: No hyperdense vessel.  Atherosclerotic calcifications Skull: Normal. Negative for fracture or focal lesion. Sinuses/Orbits: Visualized orbits show no acute finding. Mild ethmoid sinus mucosal thickening. No significant mastoid effusion. IMPRESSION: 1. No evidence of acute intracranial abnormality. 2. Moderate generalized parenchymal atrophy with chronic small vessel ischemic disease. Known remote left thalamic/internal capsule lacunar infarct. 3. Mild ethmoid sinus mucosal thickening. Electronically Signed   By: Kellie Simmering DO   On: 03/10/2020 12:55   CT Angio Neck W and/or Wo Contrast  Result Date: 03/10/2020 CLINICAL DATA:  Transient dizziness and blurry vision. Additional provided: Transient dizziness and blurred vision since Wednesday. EXAM: CT ANGIOGRAPHY HEAD AND NECK TECHNIQUE: Multidetector CT imaging of the head and neck was performed using the standard protocol during bolus administration of intravenous contrast. Multiplanar CT image reconstructions and MIPs were obtained to evaluate the vascular anatomy. Carotid stenosis measurements (when applicable) are obtained utilizing NASCET criteria, using the distal internal carotid diameter as the denominator. CONTRAST:  77mL OMNIPAQUE IOHEXOL 350 MG/ML SOLN COMPARISON:  Brain MRI and head CT performed earlier the same day 03/10/2020 FINDINGS: CTA NECK FINDINGS Aortic arch: Standard aortic branching. Atherosclerotic plaque within the visualized aortic arch and proximal major branch vessels of the neck. No hemodynamically significant innominate or proximal subclavian artery stenosis. Right carotid system: CCA and ICA patent within the neck without significant stenosis (50% or greater). Mild calcified plaque within the CCA, carotid bifurcation and proximal ICA. Left carotid system: CCA and ICA patent within the neck without significant stenosis (50% or greater). Mild to moderate mixed plaque within the distal CCA, carotid bifurcation and proximal ICA.  Vertebral arteries: Mixed plaque at the origin the right vertebral artery. There is little, if any, opacification of the V1 and V2 right vertebral artery to the C2 level. There is reconstitution at the C2 and more cranial levels. The dominant left vertebral artery is patent throughout the neck without significant stenosis. Minimal calcified plaque at the origin of this vessel. Skeleton: No acute bony abnormality or aggressive osseous lesion. Cervical spondylosis with multilevel disc degeneration, posterior disc osteophytes, uncovertebral and facet hypertrophy. Prominent multilevel cervical anterior osteophytes, most notably at C3-C4. Multilevel degenerative osseous fusion Other neck: No neck mass or cervical lymphadenopathy. Subcentimeter right thyroid lobe nodule not meeting consensus criteria for ultrasound follow-up. Upper chest: No consolidation within the imaged lung apices. Review of the MIP images confirms the above findings CTA HEAD FINDINGS Anterior circulation: The intracranial internal carotid arteries are patent. Mild calcified plaque within these vessels without significant stenosis. The M1 middle cerebral arteries are patent without significant stenosis. No M2 proximal branch occlusion is identified. Moderate focal stenosis within a proximal to mid M2 left MCA branch vessel (series 12, image 24). The anterior cerebral arteries are patent without significant proximal stenosis. There is a 1-2 mm inferiorly projecting vascular protrusion arising from the supraclinoid right ICA which may reflect an infundibulum or tiny aneurysm (series 9, image 75). Posterior circulation: Multifocal high-grade stenoses within the V4 right vertebral  artery. The proximal right PICA is poorly delineated suggestive of high-grade stenosis within this vessel. The intracranial left vertebral artery is patent without significant stenosis, as is the basilar artery. The posterior cerebral arteries are patent bilaterally without  significant proximal stenosis. Atherosclerotic irregularity of the P3 and more distal posterior cerebral arteries. Posterior communicating arteries are poorly delineated and may be hypoplastic or absent bilaterally. Venous sinuses: Within limitations of contrast timing, no convincing thrombus. Anatomic variants: As described. Review of the MIP images confirms the above findings IMPRESSION: CTA neck: 1. There is little, if any, opacification of the right vertebral artery to the C2 level. Findings may be secondary to atherosclerotic disease. Dissection cannot be excluded. There is reconstitution of flow at the C2 and more cranial levels. Multifocal high-grade stenoses within the V4 right vertebral artery. Additionally, the proximal right PICA is poorly delineated suggestive of high-grade stenosis. 2. The bilateral common and internal carotid arteries are patent within the neck without hemodynamically significant stenosis. Atherosclerotic plaque within the carotid systems as described. CTA head: 1. Multifocal high-grade stenoses within the V4 right vertebral artery and suspected high-grade stenosis of the proximal right PICA as described above. 2. Moderate focal stenosis within a proximal-to-mid M2 left MCA branch vessel. 3. No other significant proximal intracranial arterial stenosis is identified. 4. 1-2 mm infundibulum versus tiny aneurysm arising from the supraclinoid right ICA. Electronically Signed   By: Kellie Simmering DO   On: 03/10/2020 16:24   MR BRAIN WO CONTRAST  Result Date: 03/10/2020 CLINICAL DATA:  Transient dizziness and blurry vision. EXAM: MRI HEAD WITHOUT CONTRAST TECHNIQUE: Multiplanar, multiecho pulse sequences of the brain and surrounding structures were obtained without intravenous contrast. COMPARISON:  Head CT 03/10/2020, brain MRI 09/01/2017 FINDINGS: Brain: Moderate scattered T2/FLAIR hyperintensity within the cerebral white matter is nonspecific, but consistent with chronic small vessel  ischemic disease. Findings have not significantly changed since MRI 09/01/2017. Redemonstrated chronic lacunar infarcts within the left thalamocapsular junction and right cerebellar hemisphere. Multiple supratentorial and infratentorial chronic microhemorrhages within the cerebral hemispheres, basal ganglia, left thalamus and cerebellum. Stable, moderate generalized parenchymal atrophy. There is no acute infarct. No evidence of intracranial mass. No extra-axial fluid collection. No midline shift. Vascular: Loss of the expected flow void within portions of the V4 right vertebral artery (series 10, image 1). Skull and upper cervical spine: No focal marrow lesion. Sinuses/Orbits: Bilateral lens replacements. Small right maxillary sinus mucous retention cyst. Mild ethmoid sinus mucosal thickening. IMPRESSION: 1. No evidence of acute infarct. 2. Signal abnormality within portions of the V4 right vertebral artery suggestive of high-grade stenosis or occlusion. Consider CTA or MRA for further evaluation. 3. Stable moderate generalized parenchymal and chronic small vessel ischemic disease. 4. Redemonstrated chronic lacunar infarcts within the left thalamocapsular junction and right cerebellum. 5. 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 Electronically Signed   By: Kellie Simmering DO   On: 03/10/2020 15:12    PHYSICAL EXAM Obese elderly Caucasian male not in distress. . Afebrile. Head is nontraumatic. Neck is supple without bruit.    Cardiac exam no murmur or gallop. Lungs are clear to auscultation. Distal pulses are well felt. Neurological Exam ;  Awake  Alert oriented x 3. Normal speech and language.eye movements full without nystagmus.fundi were not visualized. Vision acuity and fields appear normal. Hearing is normal. Palatal movements are normal. Face symmetric. Tongue midline. Normal strength, tone,  reflexes and coordination. Normal sensation. Gait deferred.  ASSESSMENT/PLAN Mr.  Leroy [name] is a 86 y.o. male with history of dementia, CKD, HLD, HTN, intraventricular conduction defect, PVD, prior stroke in 2017 with weakness in legs, tremor and lumbar laminectomy presenting with blurred vision and dizziness following vertigo 4 days previous.   Vertebrobasilar insufficiency, symptomatic  CT head No acute abnormality. Small vessel disease. Atrophy. Old L thalamic lacune. sinuz dz  MRI  No acute infarct. R V4 high-grade vs occlusion. Small vessel disease. Atrophy. Old L thalamocapsular jxn and R cerebellar infarcts. Chronic microhemorrhages.   CTA head multifocal high-grade R V4 stenoses. Proximal R PICA high-grade stenosis. Moderate proximal to mid L M2 stenosis. Supraclinoid R ICA infundibulum vs tiny aneurysm.  CTA neck tiny R VA opacification - ? Atherosclerosis vs dissection. Multifocal high-grade stenosis R V4. R PICA high-grade stenosis.   2D Echo pending.  LDL 86  HgbA1c 5.8  Heparin 5000 units sq tid for VTE prophylaxis  No antithrombotic prior to admission, now on No antithrombotic. Given high-grade internal capsule atherosclerosis and taking into acct multiple microhemorrhages, will put on aspirin 81 EC and plavix x 3 weeks then plavix alone. Orders placed.    Therapy recommendations:  No OT, no PT   Disposition:  pending   Hypertensive Uregency  BP as high as 209/115  Stable  Ok for permissive HTN as an IP . Long-term BP goal < 140   Hyperlipidemia  Home meds:  lipitor 40, resumed in hospital  LDL 86, goal < 70  Continue statin at discharge  Other Stroke Risk Factors  Advanced age  Smokeless tobacco use, cher  Hx stroke/TIA  12/2015 -  Left basal ganglia ICH involving the posterior limb internal capsule and lateral thalamus    Other Active Problems  CKD stage IIIa  Probable anemia of chronic dz  Baseline dementia  Hospital day # 0 I have  personally obtained history,examined this patient, reviewed notes, independently viewed imaging studies, participated in medical decision making and plan of care.ROS completed by me personally and pertinent positives fully documented  I have made any additions or clarifications directly to the above note.  Patient presented with symptoms of posterior circulation TIA due to symptomatic terminal high-grade right vertebral artery stenosis.  Recommend aspirin and Plavix for 3 weeks only given history of peptic ulcer and bleeding and followed by Plavix alone.  Aggressive risk factor modification.  Check echocardiogram results.  Discussed with patient and family and answered questions.  Greater than 50% time during this 35-minute visit was spent on counseling and coordination of care about his TIA and posterior circulation occlusive disease and stroke prevention and answering questions Antony Contras, MD Medical Director [facility] Pager: [phone] 03/11/2020 3:58 PM   To contact Stroke Continuity provider, please refer to [email or web address] After hours, contact General Neurology

## 2020-03-11 NOTE — Progress Notes (Signed)
  Echocardiogram 2D Echocardiogram has been performed.  Leroy [name] 03/11/2020, 12:08 PM

## 2020-04-19 ENCOUNTER — Ambulatory Visit (INDEPENDENT_AMBULATORY_CARE_PROVIDER_SITE_OTHER): Payer: Medicare HMO | Admitting: Vascular Surgery

## 2020-04-19 ENCOUNTER — Encounter (INDEPENDENT_AMBULATORY_CARE_PROVIDER_SITE_OTHER): Payer: Self-pay | Admitting: Vascular Surgery

## 2020-04-19 ENCOUNTER — Other Ambulatory Visit: Payer: Self-pay

## 2020-04-19 VITALS — BP 160/82 | HR 82 | Ht 73.0 in | Wt 220.0 lb

## 2020-04-19 DIAGNOSIS — K449 Diaphragmatic hernia without obstruction or gangrene: Secondary | ICD-10-CM | POA: Insufficient documentation

## 2020-04-19 DIAGNOSIS — E669 Obesity, unspecified: Secondary | ICD-10-CM | POA: Insufficient documentation

## 2020-04-19 DIAGNOSIS — I6529 Occlusion and stenosis of unspecified carotid artery: Secondary | ICD-10-CM | POA: Insufficient documentation

## 2020-04-19 DIAGNOSIS — E785 Hyperlipidemia, unspecified: Secondary | ICD-10-CM

## 2020-04-19 DIAGNOSIS — R1084 Generalized abdominal pain: Secondary | ICD-10-CM | POA: Diagnosis not present

## 2020-04-19 DIAGNOSIS — M79605 Pain in left leg: Secondary | ICD-10-CM

## 2020-04-19 DIAGNOSIS — M5136 Other intervertebral disc degeneration, lumbar region: Secondary | ICD-10-CM | POA: Insufficient documentation

## 2020-04-19 DIAGNOSIS — G459 Transient cerebral ischemic attack, unspecified: Secondary | ICD-10-CM | POA: Diagnosis not present

## 2020-04-19 DIAGNOSIS — I10 Essential (primary) hypertension: Secondary | ICD-10-CM

## 2020-04-19 DIAGNOSIS — I6523 Occlusion and stenosis of bilateral carotid arteries: Secondary | ICD-10-CM

## 2020-04-19 DIAGNOSIS — M79609 Pain in unspecified limb: Secondary | ICD-10-CM | POA: Insufficient documentation

## 2020-04-19 DIAGNOSIS — K922 Gastrointestinal hemorrhage, unspecified: Secondary | ICD-10-CM | POA: Insufficient documentation

## 2020-04-19 DIAGNOSIS — R109 Unspecified abdominal pain: Secondary | ICD-10-CM | POA: Insufficient documentation

## 2020-04-19 DIAGNOSIS — M79604 Pain in right leg: Secondary | ICD-10-CM

## 2020-04-19 NOTE — Assessment & Plan Note (Signed)
The patient has lower extremity pain and weakness worsened with activity.  He certainly has multiple atherosclerotic risk factors including his advanced age.  He did have his perfusion checked but it has been about 6 years since that was done, and I think it would be prudent to check his arterial status of his lower extremities in the near future at his convenience.  He is try to elevate his legs and use compression for the swelling.  We will see him back following his noninvasive studies.

## 2020-04-19 NOTE — Assessment & Plan Note (Signed)
lipid control important in reducing the progression of atherosclerotic disease. Continue statin therapy  

## 2020-04-19 NOTE — Assessment & Plan Note (Signed)
blood pressure control important in reducing the progression of atherosclerotic disease. On appropriate oral medications.  

## 2020-04-19 NOTE — Assessment & Plan Note (Signed)
The patient's abdominal pain is not typical for chronic visceral ischemia, but with his multiple atherosclerotic issues I am happy to check a mesenteric duplex if his abdominal pain work-up is unrevealing and his primary care physician would like to have this performed.

## 2020-04-19 NOTE — Assessment & Plan Note (Signed)
Discussed the situation with the patient and his daughters.  This was likely predominantly from small vessel disease.  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

## 2020-04-19 NOTE — Progress Notes (Signed)
Patient ID: Leroy [name], male   DOB: 1930/03/11, 86 y.o.   MRN: [id number]  Chief Complaint  Patient presents with   New Patient (Initial Visit)    sparks .intacranial stenosis is 2016    HPI Leroy [name] is a 86 y.o. male.  I am asked to see the patient by Dr. Doy Hutching for evaluation of carotid stenosis and previous TIA earlier this year.  The patient does not provide much of the history, most of this is obtained from his 2 daughters today.  All he is complaining about today is his leg pain.  This has been a problem for him for some time.  We apparently assessed this 5 to 6 years ago at which time his perfusion was good and no vascular intervention was done.  He says he can only walk very short distances before his legs start to ache and burn.  He denies ulceration or infection.  He does have some swelling in his legs.  Both legs are affected about the same.  There is no clear cause or inciting event that started the symptoms. He also complains of abdominal pain over the past week or 2.  This is not necessarily postprandial.  This seems to be after his change in medication somewhat recently.  It involves the left upper quadrant and epigastrium predominantly. The primary reason for his referral is cerebrovascular disease.  When he was seen at the hospital, he was told that no intervention will be performed after his work-up.  He had confusion and some weakness but within a day or 2 he was back to his baseline with no obvious residual deficits.  He was found to have old strokes and was told he had a TIA this time.  As part of his work-up he underwent imaging. I have independently reviewed his CT angiogram of the head and neck.  The patient does have some cervical carotid stenosis, but it is fairly mild and less than 50%.  I would interpret the left side in the 30 to 40% range in the right side in the 20% range.  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  He has intracranial disease at multiple levels.   Past Medical History:  Diagnosis Date   Anemia    Bursitis    Cancer ([place])    skin cancer face   Chronic kidney disease    Dementia ([place])    Dyspnea    GI bleed    History of hiatal hernia    Hyperlipidemia    Hypertension    IVCD (intraventricular conduction defect)    Leg weakness, bilateral    Obesity    Peripheral vascular disease ([place])    Stomach ulcer    Stroke ([place])    feb 2017, weakness in legs,    Tremor    Vitamin B 12 deficiency    Vitamin D deficiency     Past Surgical History:  Procedure Laterality Date   BACK SURGERY     ESOPHAGOGASTRODUODENOSCOPY (EGD) WITH PROPOFOL N/A 11/09/2018   Procedure: ESOPHAGOGASTRODUODENOSCOPY (EGD) WITH PROPOFOL;  Surgeon: Manya Silvas, MD;  Location: [facility] ENDOSCOPY;  Service: Endoscopy;  Laterality: N/A;   EYE SURGERY      bilateral cataracts   HERNIA REPAIR     inguinal on left   INGUINAL HERNIA REPAIR Right  12/29/2017   Procedure: HERNIA REPAIR INGUINAL ADULT;  Surgeon: Herbert Pun, MD;  Location: [facility] ORS;  Service: General;  Laterality: Right;   INSERTION OF MESH Right 12/29/2017   Procedure: INSERTION OF MESH;  Surgeon: Herbert Pun, MD;  Location: [facility] ORS;  Service: General;  Laterality: Right;   LUMBAR LAMINECTOMY/DECOMPRESSION MICRODISCECTOMY N/A 08/12/2016   Procedure: LAMINECTOMY AND FORAMINOTOMY LUMBAR FOUR - LUMBAR FIVE;  Surgeon: Newman Pies, MD;  Location: [place];  Service: Neurosurgery;  Laterality: N/A;  LAMINECTOMY AND FORAMINOTOMY L4-L5    Family History  Problem Relation Age of Onset   CAD Mother    Cancer Father   no bleeding or clotting disorders. No aneurysms   Social History   Tobacco Use   Smoking status: Never Smoker   Smokeless tobacco: Current User    Types: Chew  Vaping  Use   Vaping Use: Never used  Substance Use Topics   Alcohol use: No   Drug use: No     Allergies  Allergen Reactions   Aspirin Other (See Comments)    He has bleeding ulcers    Current Outpatient Medications  Medication Sig Dispense Refill   cholecalciferol (VITAMIN D) 1000 units tablet Take 2,000 Units by mouth daily.      clopidogrel (PLAVIX) 75 MG tablet Take by mouth.     metoprolol succinate (TOPROL-XL) 25 MG 24 hr tablet Take 12.5 mg by mouth in the morning.      acetaminophen (TYLENOL) 500 MG tablet Take 500 mg by mouth daily as needed for moderate pain or headache. (Patient not taking: Reported on 04/19/2020)     atorvastatin (LIPITOR) 40 MG tablet Take 1 tablet (40 mg total) by mouth at bedtime. 30 tablet 0   pantoprazole (PROTONIX) 40 MG tablet Take 1 tablet (40 mg total) by mouth daily. 30 tablet 0   No current facility-administered medications for this visit.      REVIEW OF SYSTEMS (Negative unless checked)  Constitutional: [] Weight loss  [] Fever  [] Chills Cardiac: [] Chest pain   [] Chest pressure   [] Palpitations   [] Shortness of breath when laying flat   [] Shortness of breath at rest   [] Shortness of breath with exertion. Vascular:  [x] Pain in legs with walking   [] Pain in legs at rest   [] Pain in legs when laying flat   [x] Claudication   [] Pain in feet when walking  [] Pain in feet at rest  [] Pain in feet when laying flat   [] History of DVT   [] Phlebitis   [x] Swelling in legs   [] Varicose veins   [] Non-healing ulcers Pulmonary:   [] Uses home oxygen   [] Productive cough   [] Hemoptysis   [] Wheeze  [] COPD   [] Asthma Neurologic:  [] Dizziness  [] Blackouts   [] Seizures   [x] History of stroke   [x] History of TIA  [] Aphasia   [] Temporary blindness   [] Dysphagia   [] Weakness or numbness in arms   [] Weakness or numbness in legs Musculoskeletal:  [x] Arthritis   [] Joint swelling   [x] Joint pain   [] Low back pain Hematologic:  [] Easy bruising  [] Easy bleeding    [] Hypercoagulable state   [] Anemic  [] Hepatitis Gastrointestinal:  [] Blood in stool   [] Vomiting blood  [x] Gastroesophageal reflux/heartburn   [x] Abdominal pain Genitourinary:  [] Chronic kidney disease   [] Difficult urination  [] Frequent urination  [] Burning with urination   [] Hematuria Skin:  [] Rashes   [] Ulcers   [] Wounds Psychological:  [] History of anxiety   []  History of major depression.    Physical Exam BP Marland Kitchen)  160/82    Pulse 82    Ht 6\' 1"  (1.854 m)    Wt 220 lb (99.8 kg)    BMI 29.03 kg/m  Gen:  WD/WN, NAD. Appears younger than stated age. Head: [place]/AT, No temporalis wasting.  Ear/Nose/Throat: Hearing grossly intact, nares w/o erythema or drainage, oropharynx w/o Erythema/Exudate Eyes: Conjunctiva clear, sclera non-icteric  Neck: trachea midline.  No bruit or JVD.  Pulmonary:  Good air movement, clear to auscultation bilaterally.  Cardiac: RRR, normal S1, S2 Vascular:  Vessel Right Left  Radial Palpable Palpable                          PT Trace Palpable Trace Palpable  DP 1+ Palpable 1+Palpable   Gastrointestinal: soft, non-tender/non-distended. No guarding/reflex. No masses, surgical incisions, or scars. Musculoskeletal: M/S 5/5 throughout.  Extremities without ischemic changes.  No deformity or atrophy. Mild BLE edema. Neurologic: Sensation grossly intact in extremities.  Symmetrical.  Speech is fluent. Motor exam as listed above. Psychiatric: Judgment intact, Mood & affect appropriate for pt's clinical situation. Dermatologic: No rashes or ulcers noted.  No cellulitis or open wounds. Lymph : No Cervical, Axillary, or Inguinal lymphadenopathy.   Radiology No results found.  Labs Recent Results (from the past 2160 hour(s))  Ethanol     Status: None   Collection Time: 03/10/20 12:46 PM  Result Value Ref Range   Alcohol, Ethyl (B) <10 <10 mg/dL    Comment: (NOTE) Lowest detectable limit for serum alcohol is 10 mg/dL. For medical purposes only. Performed at  [facility], [place] [address], [place], [place] [address]   Protime-INR     Status: None   Collection Time: 03/10/20 12:46 PM  Result Value Ref Range   Prothrombin Time 12.5 11.4 - 15.2 seconds   INR 1.0 0.8 - 1.2    Comment: (NOTE) INR goal varies based on device and disease states. Performed at [facility], [place] [address], [place], [place] [address]   APTT     Status: Abnormal   Collection Time: 03/10/20 12:46 PM  Result Value Ref Range   aPTT 37 (H) 24 - 36 seconds    Comment:        IF BASELINE aPTT IS ELEVATED, SUGGEST PATIENT RISK ASSESSMENT BE USED TO DETERMINE APPROPRIATE ANTICOAGULANT THERAPY. Performed at [facility], [place] [address], [place], [place] [address]   CBC     Status: Abnormal   Collection Time: 03/10/20 12:46 PM  Result Value Ref Range   WBC 7.7 4.0 - 10.5 K/uL   RBC 4.19 (L) 4.22 - 5.81 MIL/uL   Hemoglobin 12.7 (L) 13.0 - 17.0 g/dL   HCT 38.4 (L) 39 - 52 %   MCV 91.6 80.0 - 100.0 fL   MCH 30.3 26.0 - 34.0 pg   MCHC 33.1 30.0 - 36.0 g/dL   RDW 12.9 11.5 - 15.5 %   Platelets 242 150 - 400 K/uL   nRBC 0.0 0.0 - 0.2 %    Comment: Performed at [facility], [place] [address], [place], [place] [address]  Differential     Status: None   Collection Time: 03/10/20 12:46 PM  Result Value Ref Range   Neutrophils Relative % 59 %   Neutro Abs 4.6 1.7 - 7.7 K/uL   Lymphocytes Relative 30 %   Lymphs Abs 2.3 0.7 - 4.0 K/uL   Monocytes Relative 8 %   Monocytes Absolute 0.6  0 - 1 K/uL   Eosinophils Relative 2 %   Eosinophils Absolute 0.1 0 - 0 K/uL   Basophils Relative 1 %   Basophils Absolute 0.1 0 - 0 K/uL   Immature Granulocytes 0 %   Abs Immature Granulocytes 0.02 0.00 - 0.07 K/uL    Comment: Performed at [facility], [place] [address] [place], [place] [address]  Comprehensive metabolic panel     Status: Abnormal   Collection Time: 03/10/20 12:46 PM  Result Value Ref Range   Sodium 142 135 - 145 mmol/L   Potassium 4.2 3.5 -  5.1 mmol/L   Chloride 107 98 - 111 mmol/L   CO2 25 22 - 32 mmol/L   Glucose, Bld 97 70 - 99 mg/dL    Comment: Glucose reference range applies only to samples taken after fasting for at least 8 hours.   BUN 18 8 - 23 mg/dL   Creatinine, Ser 1.36 (H) 0.61 - 1.24 mg/dL   Calcium 8.9 8.9 - 10.3 mg/dL   Total Protein 6.8 6.5 - 8.1 g/dL   Albumin 3.4 (L) 3.5 - 5.0 g/dL   AST 21 15 - 41 U/L   ALT 15 0 - 44 U/L   Alkaline Phosphatase 73 38 - 126 U/L   Total Bilirubin 1.0 0.3 - 1.2 mg/dL   GFR calc non Af Amer 46 (L) >60 mL/min   GFR calc Af Amer 54 (L) >60 mL/min   Anion gap 10 5 - 15    Comment: Performed at [facility] [address] [place], [place] [address]  Urinalysis, Routine w reflex microscopic     Status: Abnormal   Collection Time: 03/10/20 12:50 PM  Result Value Ref Range   Color, Urine STRAW (A) YELLOW   APPearance CLEAR CLEAR   Specific Gravity, Urine 1.009 1.005 - 1.030   pH 6.0 5.0 - 8.0   Glucose, UA NEGATIVE NEGATIVE mg/dL   Hgb urine dipstick SMALL (A) NEGATIVE   Bilirubin Urine NEGATIVE NEGATIVE   Ketones, ur NEGATIVE NEGATIVE mg/dL   Protein, ur NEGATIVE NEGATIVE mg/dL   Nitrite NEGATIVE NEGATIVE   Leukocytes,Ua NEGATIVE NEGATIVE   RBC / HPF 0-5 0 - 5 RBC/hpf   WBC, UA 0-5 0 - 5 WBC/hpf   Bacteria, UA NONE SEEN NONE SEEN    Comment: Performed at [facility] [address] [place], [place] [address]  I-stat chem 8, ED     Status: Abnormal   Collection Time: 03/10/20 12:57 PM  Result Value Ref Range   Sodium 140 135 - 145 mmol/L   Potassium 3.7 3.5 - 5.1 mmol/L   Chloride 103 98 - 111 mmol/L   BUN 21 8 - 23 mg/dL   Creatinine, Ser 1.40 (H) 0.61 - 1.24 mg/dL   Glucose, Bld 94 70 - 99 mg/dL    Comment: Glucose reference range applies only to samples taken after fasting for at least 8 hours.   Calcium, Ion 1.19 1.15 - 1.40 mmol/L   TCO2 28 22 - 32 mmol/L   Hemoglobin 12.2 (L) 13.0 - 17.0 g/dL   HCT 36.0 (L) 39 - 52 %  SARS CORONAVIRUS 2 (TAT  6-24 HRS) Nasopharyngeal Nasopharyngeal Swab     Status: None   Collection Time: 03/10/20  6:00 PM   Specimen: Nasopharyngeal Swab  Result Value Ref Range   SARS Coronavirus 2 NEGATIVE NEGATIVE    Comment: (NOTE) SARS-CoV-2 target nucleic acids are NOT DETECTED. The SARS-CoV-2 RNA  is generally detectable in upper and lower respiratory specimens during the acute phase of infection. Negative results do not preclude SARS-CoV-2 infection, do not rule out co-infections with other pathogens, and should not be used as the sole basis for treatment or other patient management decisions. Negative results must be combined with clinical observations, patient history, and epidemiological information. The expected result is Negative. Fact Sheet for Patients: SugarRoll.be Fact Sheet for Healthcare Providers: https://www.woods-mathews.com/ This test is not yet approved or cleared by the [place] FDA and  has been authorized for detection and/or diagnosis of SARS-CoV-2 by FDA under an Emergency Use Authorization (EUA). This EUA will remain  in effect (meaning this test can be used) for the duration of the COVID-19 declaration under Section 56 4(b)(1) of the Act, 21 U.S.C. section 360bbb-3(b)(1), unless the authorization is terminated or revoked sooner. Performed at [facility], [place] [address], [place], [place] [address]   Hemoglobin A1c     Status: Abnormal   Collection Time: 03/11/20  2:17 AM  Result Value Ref Range   Hgb A1c MFr Bld 5.8 (H) 4.8 - 5.6 %    Comment: (NOTE) Pre diabetes:          5.7%-6.4% Diabetes:              >6.4% Glycemic control for   <7.0% adults with diabetes    Mean Plasma Glucose 119.76 mg/dL    Comment: Performed at [facility] [address], [place], [place] [address]  Lipid panel     Status: None   Collection Time: 03/11/20  2:17 AM  Result Value Ref Range   Cholesterol 165 0 - 200 mg/dL    Triglycerides 80 <150 mg/dL   HDL 63 >40 mg/dL   Total CHOL/HDL Ratio 2.6 RATIO   VLDL 16 0 - 40 mg/dL   LDL Cholesterol 86 0 - 99 mg/dL    Comment:        Total Cholesterol/HDL:CHD Risk Coronary Heart Disease Risk Table                     Men   Women  1/2 Average Risk   3.4   3.3  Average Risk       5.0   4.4  2 X Average Risk   9.6   7.1  3 X Average Risk  23.4   11.0        Use the calculated Patient Ratio above and the CHD Risk Table to determine the patient's CHD Risk.        ATP III CLASSIFICATION (LDL):  <100     mg/dL   Optimal  100-129  mg/dL   Near or Above                    Optimal  130-159  mg/dL   Borderline  160-189  mg/dL   High  >190     mg/dL   Very High Performed at [facility] [address] [place], [place] [address]   CBC with Differential/Platelet     Status: Abnormal   Collection Time: 03/11/20  2:17 AM  Result Value Ref Range   WBC 9.1 4.0 - 10.5 K/uL   RBC 4.09 (L) 4.22 - 5.81 MIL/uL   Hemoglobin 12.3 (L) 13.0 - 17.0 g/dL   HCT 36.9 (L) 39 - 52 %   MCV 90.2 80.0 - 100.0 fL   MCH 30.1 26.0 - 34.0  pg   MCHC 33.3 30.0 - 36.0 g/dL   RDW 12.9 11.5 - 15.5 %   Platelets 233 150 - 400 K/uL   nRBC 0.0 0.0 - 0.2 %   Neutrophils Relative % 57 %   Neutro Abs 5.2 1.7 - 7.7 K/uL   Lymphocytes Relative 31 %   Lymphs Abs 2.8 0.7 - 4.0 K/uL   Monocytes Relative 9 %   Monocytes Absolute 0.8 0 - 1 K/uL   Eosinophils Relative 2 %   Eosinophils Absolute 0.2 0 - 0 K/uL   Basophils Relative 1 %   Basophils Absolute 0.1 0 - 0 K/uL   Immature Granulocytes 0 %   Abs Immature Granulocytes 0.02 0.00 - 0.07 K/uL    Comment: Performed at [facility] [address], [place], [place] [address]  Comprehensive metabolic panel     Status: Abnormal   Collection Time: 03/11/20  2:17 AM  Result Value Ref Range   Sodium 138 135 - 145 mmol/L   Potassium 3.6 3.5 - 5.1 mmol/L   Chloride 103 98 - 111 mmol/L   CO2 24 22 - 32 mmol/L   Glucose, Bld 92 70 - 99  mg/dL    Comment: Glucose reference range applies only to samples taken after fasting for at least 8 hours.   BUN 23 8 - 23 mg/dL   Creatinine, Ser 1.59 (H) 0.61 - 1.24 mg/dL   Calcium 9.1 8.9 - 10.3 mg/dL   Total Protein 6.4 (L) 6.5 - 8.1 g/dL   Albumin 3.3 (L) 3.5 - 5.0 g/dL   AST 18 15 - 41 U/L   ALT 14 0 - 44 U/L   Alkaline Phosphatase 65 38 - 126 U/L   Total Bilirubin 0.9 0.3 - 1.2 mg/dL   GFR calc non Af Amer 38 (L) >60 mL/min   GFR calc Af Amer 45 (L) >60 mL/min   Anion gap 11 5 - 15    Comment: Performed at [facility] [address] [place], [place] [address]  Magnesium     Status: None   Collection Time: 03/11/20  2:17 AM  Result Value Ref Range   Magnesium 1.8 1.7 - 2.4 mg/dL    Comment: Performed at [facility] [address], [place], [place] [address]  ECHOCARDIOGRAM COMPLETE     Status: None   Collection Time: 03/11/20 12:08 PM  Result Value Ref Range   Weight 3,365.1 oz   Height 74 in   BP 136/79 mmHg    Assessment/Plan:  HTN (hypertension) blood pressure control important in reducing the progression of atherosclerotic disease. On appropriate oral medications.   TIA (transient ischemic attack) Discussed the situation with the patient and his daughters.  This was likely predominantly from small vessel disease.  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  Abdominal pain The patient's abdominal pain is not typical for chronic visceral ischemia, but with his multiple atherosclerotic issues I am happy to check a mesenteric duplex if his abdominal pain work-up is unrevealing and his primary care physician would like to have this performed.  Hyperlipidemia lipid control important in reducing the progression of atherosclerotic disease. Continue statin therapy   Pain in limb The patient has lower extremity pain and weakness worsened with activity.   He certainly has multiple atherosclerotic risk factors including his advanced age.  He did have his perfusion  checked but it has been about 6 years since that was done, and I think it would be prudent to check his arterial status of his lower extremities in the near future at his convenience.  He is try to elevate his legs and use compression for the swelling.  We will see him back following his noninvasive studies.  Carotid stenosis I have independently reviewed his CT angiogram of the head and neck.  The patient does have some cervical carotid stenosis, but it is fairly mild and less than 50%.  I would interpret the left side in the 30 to 40% range in the right side in the 20% range.  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  He has intracranial disease at multiple levels.  I have had a long discussion with the patient and his daughters today.  No intervention would be of any benefit for his carotid disease.  No intervention will be planned for a unilateral vertebral lesion that is likely a chronic occlusion.  No intervention would be of benefit for the intracranial carotid disease.  I do agree with the addition of Plavix and he was prescribed that recently and does not need a refill.  I would recheck his cervical carotid stenosis with duplex in 6 months to monitor and follow this.      Leotis Pain 04/19/2020, 3:06 PM   This note was created with Dragon medical transcription system.  Any errors from dictation are unintentional.

## 2020-04-19 NOTE — Assessment & Plan Note (Signed)
I have independently reviewed his CT angiogram of the head and neck.  The patient does have some cervical carotid stenosis, but it is fairly mild and less than 50%.  I would interpret the left side in the 30 to 40% range in the right side in the 20% range.  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  He has intracranial disease at multiple levels.  I have had a long discussion with the patient and his daughters today.  No intervention would be of any benefit for his carotid disease.  No intervention will be planned for a unilateral vertebral lesion that is likely a chronic occlusion.  No intervention would be of benefit for the intracranial carotid disease.  I do agree with the addition of Plavix and he was prescribed that recently and does not need a refill.  I would recheck his cervical carotid stenosis with duplex in 6 months to monitor and follow this.

## 2020-04-19 NOTE — Patient Instructions (Signed)
Carotid Artery Disease  Carotid artery disease is the narrowing or blockage of one or both carotid arteries. This condition is also called carotid artery stenosis. The carotid arteries are the two main blood vessels on either side of the neck. They send blood to the brain, other parts of the head, and the neck.  This condition increases your risk for a stroke or a transient ischemic attack (TIA). A TIA is a "mini-stroke" that causes stroke-like symptoms that go away quickly. What are the causes? This condition is mainly caused by a narrowing and hardening of the carotid arteries. The carotid arteries can become narrow or clogged with a buildup of plaque. Plaque includes:  Fat.  Cholesterol.  Calcium.  Other substances. What increases the risk? The following factors may make you more likely to develop this condition:  Having certain medical conditions, such as: ? High cholesterol. ? High blood pressure. ? Diabetes. ? Obesity.  Smoking.  A family history of cardiovascular disease.  Not being active or lack of regular exercise.  Being male. Men have a higher risk of having arteries become narrow and harden earlier in life than women.  Old age. What are the signs or symptoms? This condition may not have any signs or symptoms until a stroke or TIA happens. In some cases, your doctor may be able to hear a whooshing sound. This can suggest a change in blood flow caused by plaque buildup. An eye exam can also help find signs of the condition. How is this treated? This condition may be treated with more than one treatment. Treatment options include:  Lifestyle changes, such as: ? Quitting smoking. ? Getting regular exercise, or getting exercise as told by your doctor. ? Eating a healthy diet. ? Managing stress. ? Keeping a healthy weight.  Medicines to control: ? Blood pressure. ? Cholesterol. ? Blood clotting.  Surgery. You may have: ? A surgery to remove the blockages in  the carotid arteries. ? A procedure in which a small mesh tube (stent) is used to widen the blocked carotid arteries. Follow these instructions at home: Eating and drinking Follow instructions about your diet from your doctor. It is important to follow a healthy diet.  Eat a diet that includes: ? A lot of fresh fruits and vegetables. ? Low-fat (lean) meats.  Avoid these foods: ? Foods that are high in fat. ? Foods that are high in salt (sodium). ? Foods that are fried. ? Foods that are processed. ? Foods that have few good nutrients (poor nutritional value).  Lifestyle   Keep a healthy weight.  Do exercises as told by your doctor to stay active. Each week, you should get one of the following: ? At least 150 minutes of exercise that raises your heart rate and makes you sweat (moderate-intensity exercise). ? At least 75 minutes of exercise that takes a lot of effort.  Do not use any products that contain nicotine or tobacco, such as cigarettes, e-cigarettes, and chewing tobacco. If you need help quitting, ask your doctor.  Do not drink alcohol if: ? Your doctor tells you not to drink. ? You are pregnant, may be pregnant, or are planning to become pregnant.  If you drink alcohol: ? Limit how much you use to:  0-1 drink a day for women.  0-2 drinks a day for men. ? Be aware of how much alcohol is in your drink. In the [place] one drink equals one 12 oz bottle of beer (355 mL), one 5   oz glass of wine (148 mL), or one 1 oz glass of hard liquor (44 mL).  Do not use drugs.  Manage your stress. Ask your doctor for tips on how to do this. General instructions  Take over-the-counter and prescription medicines only as told by your doctor.  Keep all follow-up visits as told by your doctor. This is important. Where to find more information  American Heart Association: www.heart.org Get help right away if:  You have any signs of a stroke. "BE FAST" is an easy way to remember the  main warning signs: ? B - Balance. Signs are dizziness, sudden trouble walking, or loss of balance. ? E - Eyes. Signs are trouble seeing or a change in how you see. ? F - Face. Signs are sudden weakness or loss of feeling of the face, or the face or eyelid drooping on one side. ? A - Arms. Signs are weakness or loss of feeling in an arm. This happens suddenly and usually on one side of the body. ? S - Speech. Signs are sudden trouble speaking, slurred speech, or trouble understanding what people say. ? T - Time. Time to call emergency services. Write down what time symptoms started.  You have other signs of a stroke, such as: ? A sudden, very bad headache with no known cause. ? Feeling like you may vomit (nausea). ? Vomiting. ? A seizure. These symptoms may be an emergency. Do not wait to see if the symptoms will go away. Get medical help right away. Call your local emergency services (911 in the [place] Do not drive yourself to the hospital. Summary  The carotid arteries are blood vessels on both sides of the neck.  If these arteries get smaller or get blocked, you are more likely to have a stroke or a mini-stroke.  This condition can be treated with lifestyle changes, medicines, surgery, or a blend of these treatments.  Get help right away if you have any signs of a stroke. "BE FAST" is an easy way to remember the main warning signs of stroke. This information is not intended to replace advice given to you by your health care provider. Make sure you discuss any questions you have with your health care provider. Document Revised: 05/15/2019 Document Reviewed: 05/15/2019 Elsevier Patient Education  2020 Elsevier Inc.  

## 2020-05-28 ENCOUNTER — Encounter (INDEPENDENT_AMBULATORY_CARE_PROVIDER_SITE_OTHER): Payer: Medicare HMO

## 2020-05-28 ENCOUNTER — Ambulatory Visit (INDEPENDENT_AMBULATORY_CARE_PROVIDER_SITE_OTHER): Payer: Medicare HMO | Admitting: Vascular Surgery

## 2020-06-21 ENCOUNTER — Encounter (INDEPENDENT_AMBULATORY_CARE_PROVIDER_SITE_OTHER): Payer: Self-pay | Admitting: Vascular Surgery

## 2020-06-21 ENCOUNTER — Other Ambulatory Visit: Payer: Self-pay

## 2020-06-21 ENCOUNTER — Ambulatory Visit (INDEPENDENT_AMBULATORY_CARE_PROVIDER_SITE_OTHER): Payer: Medicare HMO

## 2020-06-21 ENCOUNTER — Ambulatory Visit (INDEPENDENT_AMBULATORY_CARE_PROVIDER_SITE_OTHER): Payer: Medicare HMO | Admitting: Vascular Surgery

## 2020-06-21 VITALS — BP 135/70 | HR 66 | Resp 16 | Wt 222.0 lb

## 2020-06-21 DIAGNOSIS — I10 Essential (primary) hypertension: Secondary | ICD-10-CM | POA: Diagnosis not present

## 2020-06-21 DIAGNOSIS — R1084 Generalized abdominal pain: Secondary | ICD-10-CM

## 2020-06-21 DIAGNOSIS — G459 Transient cerebral ischemic attack, unspecified: Secondary | ICD-10-CM

## 2020-06-21 DIAGNOSIS — E785 Hyperlipidemia, unspecified: Secondary | ICD-10-CM

## 2020-06-21 DIAGNOSIS — M79604 Pain in right leg: Secondary | ICD-10-CM | POA: Diagnosis not present

## 2020-06-21 DIAGNOSIS — M79605 Pain in left leg: Secondary | ICD-10-CM

## 2020-06-21 DIAGNOSIS — I739 Peripheral vascular disease, unspecified: Secondary | ICD-10-CM

## 2020-06-21 NOTE — Patient Instructions (Signed)
Peripheral Vascular Disease  Peripheral vascular disease (PVD) is a disease of the blood vessels that are not part of your heart and brain. A simple term for PVD is poor circulation. In most cases, PVD narrows the blood vessels that carry blood from your heart to the rest of your body. This can reduce the supply of blood to your arms, legs, and internal organs, like your stomach or kidneys. However, PVD most often affects a person's lower legs and feet. Without treatment, PVD tends to get worse. PVD can also lead to acute ischemic limb. This is when an arm or leg suddenly cannot get enough blood. This is a medical emergency. Follow these instructions at home: Lifestyle  Do not use any products that contain nicotine or tobacco, such as cigarettes and e-cigarettes. If you need help quitting, ask your doctor.  Lose weight if you are overweight. Or, stay at a healthy weight as told by your doctor.  Eat a diet that is low in fat and cholesterol. If you need help, ask your doctor.  Exercise regularly. Ask your doctor for activities that are right for you. General instructions  Take over-the-counter and prescription medicines only as told by your doctor.  Take good care of your feet: ? Wear comfortable shoes that fit well. ? Check your feet often for any cuts or sores.  Keep all follow-up visits as told by your doctor This is important. Contact a doctor if:  You have cramps in your legs when you walk.  You have leg pain when you are at rest.  You have coldness in a leg or foot.  Your skin changes.  You are unable to get or have an erection (erectile dysfunction).  You have cuts or sores on your feet that do not heal. Get help right away if:  Your arm or leg turns cold, numb, and blue.  Your arms or legs become red, warm, swollen, painful, or numb.  You have chest pain.  You have trouble breathing.  You suddenly have weakness in your face, arm, or leg.  You become very  confused or you cannot speak.  You suddenly have a very bad headache.  You suddenly cannot see. Summary  Peripheral vascular disease (PVD) is a disease of the blood vessels.  A simple term for PVD is poor circulation. Without treatment, PVD tends to get worse.  Treatment may include exercise, low fat and low cholesterol diet, and quitting smoking. This information is not intended to replace advice given to you by your health care provider. Make sure you discuss any questions you have with your health care provider. Document Revised: 10/01/2017 Document Reviewed: 11/26/2016 Elsevier Patient Education  2020 Elsevier Inc.  

## 2020-06-21 NOTE — Assessment & Plan Note (Signed)
His ABIs today are likely somewhat elevated due to medial calcification but his right ABI was 1.2 and his left ABI was 1.4.  His digits were noncompressible but the waveforms were excellent into the digits bilaterally and he had multiphasic waveforms distally consistent with calcific peripheral arterial disease without flow limitation at current.  Do not think this is the cause of his symptoms.  No intervention would be of benefit.  I think checking this on an annual basis would be reasonable at this point.  Continue current medical regimen

## 2020-06-21 NOTE — Progress Notes (Signed)
MRN : [id number]  Leroy [name] is a 86 y.o. (16-Mar-1930) male who presents with chief complaint of  Chief Complaint  Patient presents with  . Follow-up    ultrasound follow up  .  History of Present Illness: Patient returns today in follow up of his leg weakness with noninvasive arterial studies today.  We saw him a couple of months ago for multiple issues.  He has done well and had no major changes or problems since his last visit.  His ABIs today are likely somewhat elevated due to medial calcification but his right ABI was 1.2 and his left ABI was 1.4.  His digits were noncompressible but the waveforms were excellent into the digits bilaterally and he had multiphasic waveforms distally consistent with calcific peripheral arterial disease without flow limitation at current.  Current Outpatient Medications  Medication Sig Dispense Refill  . atorvastatin (LIPITOR) 40 MG tablet Take 1 tablet (40 mg total) by mouth at bedtime. 30 tablet 0  . cholecalciferol (VITAMIN D) 1000 units tablet Take 2,000 Units by mouth daily.     . clopidogrel (PLAVIX) 75 MG tablet Take 75 mg by mouth daily.    . metoprolol succinate (TOPROL-XL) 25 MG 24 hr tablet Take 12.5 mg by mouth in the morning.     . pantoprazole (PROTONIX) 40 MG tablet Take 1 tablet (40 mg total) by mouth daily. 30 tablet 0  . acetaminophen (TYLENOL) 500 MG tablet Take 500 mg by mouth daily as needed for moderate pain or headache. (Patient not taking: Reported on 04/19/2020)     No current facility-administered medications for this visit.    Past Medical History:  Diagnosis Date  . Anemia   . Bursitis   . Cancer ([place])    skin cancer face  . Chronic kidney disease   . Dementia ([place])   . Dyspnea   . GI bleed   . History of hiatal hernia   . Hyperlipidemia   . Hypertension   . IVCD (intraventricular conduction defect)   . Leg weakness, bilateral   . Obesity   . Peripheral vascular disease ([place])   . Stomach ulcer   . Stroke  [facility])    feb 2017, weakness in legs,   . Tremor   . Vitamin B 12 deficiency   . Vitamin D deficiency     Past Surgical History:  Procedure Laterality Date  . BACK SURGERY    . ESOPHAGOGASTRODUODENOSCOPY (EGD) WITH PROPOFOL N/A 11/09/2018   Procedure: ESOPHAGOGASTRODUODENOSCOPY (EGD) WITH PROPOFOL;  Surgeon: Manya Silvas, MD;  Location: [facility] ENDOSCOPY;  Service: Endoscopy;  Laterality: N/A;  . EYE SURGERY      bilateral cataracts  . HERNIA REPAIR     inguinal on left  . INGUINAL HERNIA REPAIR Right 12/29/2017   Procedure: HERNIA REPAIR INGUINAL ADULT;  Surgeon: Herbert Pun, MD;  Location: [facility] ORS;  Service: General;  Laterality: Right;  . INSERTION OF MESH Right 12/29/2017   Procedure: INSERTION OF MESH;  Surgeon: Herbert Pun, MD;  Location: [facility] ORS;  Service: General;  Laterality: Right;  . LUMBAR LAMINECTOMY/DECOMPRESSION MICRODISCECTOMY N/A 08/12/2016   Procedure: LAMINECTOMY AND FORAMINOTOMY LUMBAR FOUR - LUMBAR FIVE;  Surgeon: Newman Pies, MD;  Location: [place];  Service: Neurosurgery;  Laterality: N/A;  LAMINECTOMY AND FORAMINOTOMY L4-L5     Social History   Tobacco Use  . Smoking status: Never Smoker  . Smokeless tobacco: Current User    Types: Chew  Vaping Use  . Vaping Use:  Never used  Substance Use Topics  . Alcohol use: No  . Drug use: No      Family History  Problem Relation Age of Onset  . CAD Mother   . Cancer Father   no bleeding or clotting disorders  Allergies  Allergen Reactions  . Aspirin Other (See Comments)    He has bleeding ulcers    REVIEW OF SYSTEMS (Negative unless checked)  Constitutional: [] ?Weight loss  [] ?Fever  [] ?Chills Cardiac: [] ?Chest pain   [] ?Chest pressure   [] ?Palpitations   [] ?Shortness of breath when laying flat   [] ?Shortness of breath at rest   [] ?Shortness of breath with exertion. Vascular:  [x] ?Pain in legs with walking   [] ?Pain in legs at rest   [] ?Pain in legs when laying flat    [x] ?Claudication   [] ?Pain in feet when walking  [] ?Pain in feet at rest  [] ?Pain in feet when laying flat   [] ?History of DVT   [] ?Phlebitis   [x] ?Swelling in legs   [] ?Varicose veins   [] ?Non-healing ulcers Pulmonary:   [] ?Uses home oxygen   [] ?Productive cough   [] ?Hemoptysis   [] ?Wheeze  [] ?COPD   [] ?Asthma Neurologic:  [] ?Dizziness  [] ?Blackouts   [] ?Seizures   [x] ?History of stroke   [x] ?History of TIA  [] ?Aphasia   [] ?Temporary blindness   [] ?Dysphagia   [] ?Weakness or numbness in arms   [] ?Weakness or numbness in legs Musculoskeletal:  [x] ?Arthritis   [] ?Joint swelling   [x] ?Joint pain   [] ?Low back pain Hematologic:  [] ?Easy bruising  [] ?Easy bleeding   [] ?Hypercoagulable state   [] ?Anemic  [] ?Hepatitis Gastrointestinal:  [] ?Blood in stool   [] ?Vomiting blood  [x] ?Gastroesophageal reflux/heartburn   [x] ?Abdominal pain Genitourinary:  [] ?Chronic kidney disease   [] ?Difficult urination  [] ?Frequent urination  [] ?Burning with urination   [] ?Hematuria Skin:  [] ?Rashes   [] ?Ulcers   [] ?Wounds Psychological:  [] ?History of anxiety   [] ? History of major depression.  Physical Examination  BP 135/70 (BP Location: Right Arm)   Pulse 66   Resp 16   Wt 222 lb (100.7 kg)   BMI 29.29 kg/m  Gen:  WD/WN, NAD Head: [place]/AT, No temporalis wasting. Ear/Nose/Throat: Hearing diminished, nares w/o erythema or drainage Eyes: Conjunctiva clear. Sclera non-icteric Neck: Supple.  Trachea midline Pulmonary:  Good air movement, no use of accessory muscles.  Cardiac: RRR, no JVD Vascular:  Vessel Right Left  Radial Palpable Palpable                          PT Palpable Palpable  DP Palpable Palpable   Gastrointestinal: soft, non-tender/non-distended. No guarding/reflex.  Musculoskeletal: M/S 5/5 throughout.  No deformity or atrophy.  No significant lower extremity edema. Neurologic: Sensation grossly intact in extremities.  Symmetrical.  Speech is fluent.  Psychiatric: Judgment intact, Mood &  affect appropriate for pt's clinical situation. Dermatologic: No rashes or ulcers noted.  No cellulitis or open wounds.       Labs No results found for this or any previous visit (from the past 2160 hour(s)).  Radiology No results found.  Assessment/Plan HTN (hypertension) blood pressure control important in reducing the progression of atherosclerotic disease. On appropriate oral medications.   TIA (transient ischemic attack) Discussed the situation with the patient and his daughters previously.  This was likely predominantly from small vessel disease.  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  Abdominal  pain The patient's abdominal pain is not typical for chronic visceral ischemia, but with his multiple atherosclerotic issues I am happy to check a mesenteric duplex if his abdominal pain work-up is unrevealing and his primary care physician would like to have this performed.  Hyperlipidemia lipid control important in reducing the progression of atherosclerotic disease. Continue statin therapy  PAD (peripheral artery disease) (HCC) His ABIs today are likely somewhat elevated due to medial calcification but his right ABI was 1.2 and his left ABI was 1.4.  His digits were noncompressible but the waveforms were excellent into the digits bilaterally and he had multiphasic waveforms distally consistent with calcific peripheral arterial disease without flow limitation at current.  Do not think this is the cause of his symptoms.  No intervention would be of benefit.  I think checking this on an annual basis would be reasonable at this point.  Continue current medical regimen    Leotis Pain, MD  06/21/2020 10:03 AM    This note was created with Dragon medical transcription system.  Any errors from dictation are purely unintentional

## 2020-07-04 ENCOUNTER — Other Ambulatory Visit: Payer: Self-pay | Admitting: Internal Medicine

## 2020-07-04 DIAGNOSIS — R270 Ataxia, unspecified: Secondary | ICD-10-CM

## 2020-07-19 ENCOUNTER — Other Ambulatory Visit: Payer: Self-pay

## 2020-07-19 ENCOUNTER — Ambulatory Visit
Admission: RE | Admit: 2020-07-19 | Discharge: 2020-07-19 | Disposition: A | Discharge status: Home / Self Care | Payer: Medicare HMO | Source: Ambulatory Visit | Attending: Internal Medicine | Admitting: Internal Medicine

## 2020-07-19 DIAGNOSIS — R270 Ataxia, unspecified: Secondary | ICD-10-CM | POA: Diagnosis present

## 2020-10-21 ENCOUNTER — Encounter (INDEPENDENT_AMBULATORY_CARE_PROVIDER_SITE_OTHER): Payer: Self-pay

## 2020-10-22 ENCOUNTER — Ambulatory Visit (INDEPENDENT_AMBULATORY_CARE_PROVIDER_SITE_OTHER): Payer: Medicare HMO | Admitting: Vascular Surgery

## 2020-10-22 ENCOUNTER — Encounter (INDEPENDENT_AMBULATORY_CARE_PROVIDER_SITE_OTHER): Payer: Medicare HMO

## 2020-12-19 ENCOUNTER — Other Ambulatory Visit: Payer: Self-pay | Admitting: Neurology

## 2020-12-19 DIAGNOSIS — F0151 Vascular dementia with behavioral disturbance: Secondary | ICD-10-CM

## 2020-12-19 DIAGNOSIS — F01518 Vascular dementia, unspecified severity, with other behavioral disturbance: Secondary | ICD-10-CM

## 2020-12-20 ENCOUNTER — Telehealth: Payer: Self-pay | Admitting: Nurse Practitioner

## 2020-12-20 NOTE — Telephone Encounter (Signed)
Called patient's home number to schedule Palliative visit, no answer - left message requesting a return call.  I then called wife's cell and spoke with her regarding the referral and she said she was with the patient yesterday at the MD appointment but didn't hear anything about a Palliative referral.  Wife wanted to talk with her daughter about this first and then she will call me back.

## 2020-12-25 ENCOUNTER — Telehealth: Payer: Self-pay | Admitting: Nurse Practitioner

## 2020-12-25 NOTE — Telephone Encounter (Signed)
Called wife back to f/u on previous converation to see if she spoke with [name] (daughter) and she did but she also stated that patient told her that he didn't need a nurse coming out because he has his doctors.  Wife suggested that I call and speak with [name] about this, she said that [name] has HCPOA.  Told wife I would call and speak to her about this.   I spoke with [name] and she said that she would be going to patient's house this weekend and she wanted to talk with him about this first to make sure that he was okay with NP coming out to see him.  Daughter said that she would contact us after she speaks with him to let us know what he has decided to do.  I have emailed her our Palliative Brochure along with our contact information to call us back.

## 2021-01-03 ENCOUNTER — Ambulatory Visit
Admission: RE | Admit: 2021-01-03 | Discharge: 2021-01-03 | Disposition: A | Discharge status: Home / Self Care | Payer: Medicare Other | Source: Ambulatory Visit | Attending: Neurology | Admitting: Neurology

## 2021-01-03 ENCOUNTER — Other Ambulatory Visit: Payer: Self-pay

## 2021-01-03 DIAGNOSIS — G309 Alzheimer's disease, unspecified: Secondary | ICD-10-CM | POA: Insufficient documentation

## 2021-01-03 DIAGNOSIS — F01518 Vascular dementia, unspecified severity, with other behavioral disturbance: Secondary | ICD-10-CM

## 2021-01-03 DIAGNOSIS — F0151 Vascular dementia with behavioral disturbance: Secondary | ICD-10-CM | POA: Insufficient documentation

## 2021-01-04 ENCOUNTER — Emergency Department (HOSPITAL_COMMUNITY): Payer: Medicare Other

## 2021-01-04 ENCOUNTER — Other Ambulatory Visit: Payer: Self-pay

## 2021-01-04 ENCOUNTER — Emergency Department (HOSPITAL_COMMUNITY)
Admission: EM | Admit: 2021-01-04 | Discharge: 2021-01-05 | Disposition: A | Discharge status: Home / Self Care | Payer: Medicare Other | Attending: Emergency Medicine | Admitting: Emergency Medicine

## 2021-01-04 ENCOUNTER — Encounter (HOSPITAL_COMMUNITY): Payer: Self-pay | Admitting: Emergency Medicine

## 2021-01-04 DIAGNOSIS — R42 Dizziness and giddiness: Secondary | ICD-10-CM | POA: Diagnosis not present

## 2021-01-04 DIAGNOSIS — I614 Nontraumatic intracerebral hemorrhage in cerebellum: Secondary | ICD-10-CM | POA: Insufficient documentation

## 2021-01-04 DIAGNOSIS — R202 Paresthesia of skin: Secondary | ICD-10-CM | POA: Diagnosis not present

## 2021-01-04 DIAGNOSIS — R531 Weakness: Secondary | ICD-10-CM | POA: Diagnosis present

## 2021-01-04 LAB — COMPREHENSIVE METABOLIC PANEL
ALT: 12 U/L (ref 0–44)
AST: 23 U/L (ref 15–41)
Albumin: 3.7 g/dL (ref 3.5–5.0)
Alkaline Phosphatase: 77 U/L (ref 38–126)
Anion gap: 7 (ref 5–15)
BUN: 19 mg/dL (ref 8–23)
CO2: 27 mmol/L (ref 22–32)
Calcium: 9.2 mg/dL (ref 8.9–10.3)
Chloride: 106 mmol/L (ref 98–111)
Creatinine, Ser: 1.52 mg/dL — ABNORMAL HIGH (ref 0.61–1.24)
GFR, Estimated: 44 mL/min — ABNORMAL LOW (ref >60)
Glucose, Bld: 105 mg/dL — ABNORMAL HIGH (ref 70–99)
Potassium: 3.6 mmol/L (ref 3.5–5.1)
Sodium: 140 mmol/L (ref 135–145)
Total Bilirubin: 0.8 mg/dL (ref 0.3–1.2)
Total Protein: 6.8 g/dL (ref 6.5–8.1)

## 2021-01-04 LAB — CBC
HCT: 38 % — ABNORMAL LOW (ref 39.0–52.0)
Hemoglobin: 12.5 g/dL — ABNORMAL LOW (ref 13.0–17.0)
MCH: 30.2 pg (ref 26.0–34.0)
MCHC: 32.9 g/dL (ref 30.0–36.0)
MCV: 91.8 fL (ref 80.0–100.0)
Platelets: 242 10*3/uL (ref 150–400)
RBC: 4.14 MIL/uL — ABNORMAL LOW (ref 4.22–5.81)
RDW: 13.3 % (ref 11.5–15.5)
WBC: 7.5 10*3/uL (ref 4.0–10.5)
nRBC: 0 % (ref 0.0–0.2)

## 2021-01-04 LAB — DIFFERENTIAL
Abs Immature Granulocytes: 0.03 10*3/uL (ref 0.00–0.07)
Basophils Absolute: 0.1 10*3/uL (ref 0.0–0.1)
Basophils Relative: 1 %
Eosinophils Absolute: 0.2 10*3/uL (ref 0.0–0.5)
Eosinophils Relative: 3 %
Immature Granulocytes: 0 %
Lymphocytes Relative: 28 %
Lymphs Abs: 2.1 10*3/uL (ref 0.7–4.0)
Monocytes Absolute: 0.6 10*3/uL (ref 0.1–1.0)
Monocytes Relative: 8 %
Neutro Abs: 4.6 10*3/uL (ref 1.7–7.7)
Neutrophils Relative %: 60 %

## 2021-01-04 LAB — APTT: aPTT: 33 seconds (ref 24–36)

## 2021-01-04 LAB — PROTIME-INR
INR: 1 (ref 0.8–1.2)
Prothrombin Time: 12.8 seconds (ref 11.4–15.2)

## 2021-01-04 MED ORDER — SODIUM CHLORIDE 0.9% FLUSH
3.0000 mL | Freq: Once | INTRAVENOUS | Status: DC
Start: 2021-01-04 — End: 2021-01-05

## 2021-01-04 NOTE — ED Provider Notes (Signed)
[facility] EMERGENCY DEPARTMENT Provider Note   CSN: [id number] Arrival date & time: 01/04/21  1654     History Chief Complaint  Patient presents with  . Weakness  . Fall    Leroy [name] is a 87 y.o. male.  HPI   87 year old male with past medical history of HTN, HLD, GI bleed, previous CVA, CKD, dementia presents the emergency department with weakness, dizziness and tingling in his bilateral hands.  Patient is accompanied by his daughter, he is at times confused and not a completely reliable historian.  Daughter states that this is baseline.  Patient states over the past couple days he has felt dizzy, this is worse with movement but not relieved by rest.  He has felt unsteady when he was walking.  This resulted in a mechanical fall today where he fell down onto his bottom, no head injury.  He denies any acute headache.  Denies any chest pain, shortness of breath, abdominal pain, vomiting/diarrhea, swelling of his lower extremities.  He states he is otherwise been eating and drinking like usual, compliant with his medications.  He follows up with neurology.  They called him today and talked about his complaints, he was sent here for an MRI to rule out CVA.    Past Medical History:  Diagnosis Date  . Anemia   . Bursitis   . Cancer ([place])    skin cancer face  . Chronic kidney disease   . Dementia ([place])   . Dyspnea   . GI bleed   . History of hiatal hernia   . Hyperlipidemia   . Hypertension   . IVCD (intraventricular conduction defect)   . Leg weakness, bilateral   . Obesity   . Peripheral vascular disease ([place])   . Stomach ulcer   . Stroke [facility])    feb 2017, weakness in legs,   . Tremor   . Vitamin B 12 deficiency   . Vitamin D deficiency     Patient Active Problem List   Diagnosis Date Noted  . PAD (peripheral artery disease) ([place]) 06/21/2020  . Degenerative disc disease, lumbar 04/19/2020  . Hiatal hernia 04/19/2020  . Obesity 04/19/2020  .  Upper GI bleed 04/19/2020  . Abdominal pain 04/19/2020  . Pain in limb 04/19/2020  . Carotid stenosis 04/19/2020  . TIA (transient ischemic attack) 03/10/2020  . Hyperlipidemia 03/10/2020  . HTN (hypertension) 03/10/2020  . Schatzki's ring 12/02/2018  . Constipation due to slow transit 10/06/2018  . Stool incontinence 10/06/2018  . 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 Vitamin D deficiency 06/22/2018  . Carpal tunnel syndrome, right 07/13/2017  . Diarrhea 04/20/2017  . Bursitis of shoulder, right 02/22/2017  . Rotator cuff tendinitis, right 02/22/2017  . B12 deficiency 10/12/2016  . CKD (chronic kidney disease) stage 3, GFR 30-59 ml/min ([facility]) 10/12/2016  . Lumbar stenosis with neurogenic claudication 08/12/2016  . IVCD (intraventricular conduction defect) 07/13/2016  . Preop cardiovascular exam 07/13/2016  . SOB (shortness of breath) on exertion 07/13/2016  . Polyneuropathy 05/25/2016  . Benign essential tremor 04/08/2016  . Leg weakness, bilateral 04/08/2016  . Hypertensive emergency 12/13/2015  . Nontraumatic intracerebral hemorrhage ([place]) 12/12/2015  . Acute intracerebral hemorrhage ([place]) 12/12/2015  . Anemia 06/25/2014    Past Surgical History:  Procedure Laterality Date  . BACK SURGERY    . ESOPHAGOGASTRODUODENOSCOPY (EGD) WITH PROPOFOL N/A 11/09/2018   Procedure: ESOPHAGOGASTRODUODENOSCOPY (EGD) WITH PROPOFOL;  Surgeon: Manya Silvas, MD;  Location: [facility] ENDOSCOPY;  Service: Endoscopy;  Laterality: N/A;  . EYE SURGERY      bilateral cataracts  . HERNIA REPAIR     inguinal on left  . INGUINAL HERNIA REPAIR Right 12/29/2017   Procedure: HERNIA REPAIR INGUINAL ADULT;  Surgeon: Herbert Pun, MD;  Location: [facility] ORS;  Service: General;  Laterality: Right;  . INSERTION OF MESH Right 12/29/2017   Procedure: INSERTION OF MESH;  Surgeon: Herbert Pun, MD;  Location: [facility] ORS;  Service: General;  Laterality:  Right;  . LUMBAR LAMINECTOMY/DECOMPRESSION MICRODISCECTOMY N/A 08/12/2016   Procedure: LAMINECTOMY AND FORAMINOTOMY LUMBAR FOUR - LUMBAR FIVE;  Surgeon: Newman Pies, MD;  Location: [facility];  Service: Neurosurgery;  Laterality: N/A;  LAMINECTOMY AND FORAMINOTOMY L4-L5       Family History  Problem Relation Age of Onset  . CAD Mother   . Cancer Father     Social History   Tobacco Use  . Smoking status: Never Smoker  . Smokeless tobacco: Current User    Types: Chew  Vaping Use  . Vaping Use: Never used  Substance Use Topics  . Alcohol use: No  . Drug use: No    Home Medications Prior to Admission medications   Medication Sig Start Date End Date Taking? Authorizing Provider  acetaminophen (TYLENOL) 500 MG tablet Take 500 mg by mouth daily as needed for moderate pain or headache. Patient not taking: Reported on 04/19/2020    [provider]  atorvastatin (LIPITOR) 40 MG tablet Take 1 tablet (40 mg total) by mouth at bedtime. 03/11/20 06/21/20  Arrien, Jimmy Picket, MD  cholecalciferol (VITAMIN D) 1000 units tablet Take 2,000 Units by mouth daily.     [provider]  clopidogrel (PLAVIX) 75 MG tablet Take 75 mg by mouth daily. 05/13/20   [provider]  metoprolol succinate (TOPROL-XL) 25 MG 24 hr tablet Take 12.5 mg by mouth in the morning.  02/27/20 02/26/21  [provider]  pantoprazole (PROTONIX) 40 MG tablet Take 1 tablet (40 mg total) by mouth daily. 03/11/20 06/21/20  Arrien, Jimmy Picket, MD    Allergies    Aspirin  Review of Systems   Review of Systems  Unable to perform ROS: Dementia    Physical Exam Updated Vital Signs BP (!) 186/103   Pulse 71   Temp (!) 97.5 F (36.4 C) (Oral)   Resp 15   SpO2 98%   Physical Exam Vitals and nursing note reviewed.  Constitutional:      Appearance: Normal appearance.  HENT:     Head: Normocephalic.     Mouth/Throat:     Mouth: Mucous membranes are moist.  Cardiovascular:      Rate and Rhythm: Normal rate.  Pulmonary:     Effort: Pulmonary effort is normal. No respiratory distress.  Abdominal:     Palpations: Abdomen is soft.     Tenderness: There is no abdominal tenderness.  Skin:    General: Skin is warm.  Neurological:     Mental Status: He is alert. Mental status is at baseline.     Cranial Nerves: No cranial nerve deficit.     Comments: NIH 0  Psychiatric:        Mood and Affect: Mood normal.     ED Results / Procedures / Treatments   Labs (all labs ordered are listed, but only abnormal results are displayed) Labs Reviewed  CBC - Abnormal; Notable for the following components:      Result Value  RBC 4.14 (*)    Hemoglobin 12.5 (*)    HCT 38.0 (*)    All other components within normal limits  COMPREHENSIVE METABOLIC PANEL - Abnormal; Notable for the following components:   Glucose, Bld 105 (*)    Creatinine, Ser 1.52 (*)    GFR, Estimated 44 (*)    All other components within normal limits  PROTIME-INR  APTT  DIFFERENTIAL    EKG EKG Interpretation  Date/Time:  Saturday January 04 2021 17:13:30 EST Ventricular Rate:  76 PR Interval:  216 QRS Duration: 128 QT Interval:  416 QTC Calculation: 468 R Axis:   -56 Text Interpretation: Sinus rhythm with 1st degree A-V block Left axis deviation Non-specific intra-ventricular conduction block Minimal voltage criteria for LVH, may be normal variant ( Cornell product ) Abnormal ECG NSR,  no change from previous Confirmed by Lavenia Atlas [phone]) on 01/04/2021 7:39:34 PM   Radiology CT HEAD WO CONTRAST  Result Date: 01/04/2021 CLINICAL DATA:  Weakness. Dizziness. Bilateral hand numbness. Fall. EXAM: CT HEAD WITHOUT CONTRAST TECHNIQUE: Contiguous axial images were obtained from the base of the skull through the vertex without intravenous contrast. COMPARISON:  01/03/2021 FINDINGS: Brain: Expected cerebral/cerebellar atrophy for age. Moderate low density in the periventricular white matter likely  related to small vessel disease. No mass lesion, hemorrhage, hydrocephalus, acute infarct, intra-axial, or extra-axial fluid collection. Vascular: No hyperdense vessel or unexpected calcification. Skull: No significant soft tissue swelling.  No skull fracture. Sinuses/Orbits: Normal imaged portions of the orbits and globes. Clear paranasal sinuses and mastoid air cells. Other: None. IMPRESSION: 1. No acute intracranial abnormality. 2. Cerebral/cerebellar atrophy and small vessel ischemic change. Electronically Signed   By: Abigail Miyamoto M.D.   On: 01/04/2021 17:55   CT HEAD WO CONTRAST  Result Date: 01/03/2021 CLINICAL DATA:  Abnormal sensation in the front of the head. Dizziness. EXAM: CT HEAD WITHOUT CONTRAST TECHNIQUE: Contiguous axial images were obtained from the base of the skull through the vertex without intravenous contrast. COMPARISON:  07/19/2020 CT.  MRI 03/10/2020. FINDINGS: Brain: Age related volume loss. Mild chronic small-vessel ischemic change of the hemispheric white matter. No sign of acute infarction, mass lesion, hemorrhage, hydrocephalus or extra-axial collection. Vascular: There is atherosclerotic calcification of the major vessels at the base of the brain. Skull: Negative Sinuses/Orbits: Clear/normal Other: None IMPRESSION: No acute finding by CT. Age related atrophy and mild chronic small-vessel ischemic change of the white matter. Electronically Signed   By: Nelson Chimes M.D.   On: 01/03/2021 14:19    Procedures Procedures   Medications Ordered in ED Medications  sodium chloride flush (NS) 0.9 % injection 3 mL (3 mLs Intravenous Not Given 01/04/21 1904)    ED Course  I have reviewed the triage vital signs and the nursing notes.  Pertinent labs & imaging results that were available during my care of the patient were reviewed by me and considered in my medical decision making (see chart for details).    MDM Rules/Calculators/A&P                          87 year old male  presents the emergency department with generalized weakness, numbness in his bilateral hands and worsening persistent dizziness for the past 3 days.  This resulted in a mechanical fall.  Metabolic work-up is baseline, he was referred here for an MRI to rule out stroke.  MRI shows chronic microhemorrhages in the cerebellar area and bilateral hemispheres.  Consulted neurology, these  appear to be unchanged from a previous MRI.  However with the worsening persistent dizziness they recommend getting an MRI of the cervical spine and MRA of the head and neck to evaluate the vasculature.  Patient and daughter updated about neurology's request, they are willing to stay for the MRI imaging.  He continues to have mild dizziness at rest.  Patient signed out pending MRI imaging and neurology follow-up.  Final Clinical Impression(s) / ED Diagnoses Final diagnoses:  None    Rx / DC Orders ED Discharge Orders    None       Lorelle Gibbs, DO 01/04/21 2352

## 2021-01-04 NOTE — ED Triage Notes (Signed)
Pt to triage via [facility] from home.  C/o generalized weakness, dizziness, and numbness to bilateral hands x 1 week.  Called PCP yesterday and told to come to ED for stroke work-up.  Pt fell 2 hours ago while taking trash outside.  Had bilateral leg weakness and lowered himself to ground.  Denies injury from fall.  Pt states his hands feel fine right now and denies numbness.  No arm drift.

## 2021-01-04 NOTE — ED Notes (Signed)
Pt transported to MRI 

## 2021-01-05 ENCOUNTER — Emergency Department (HOSPITAL_COMMUNITY): Payer: Medicare Other

## 2021-01-05 LAB — URINALYSIS, ROUTINE W REFLEX MICROSCOPIC
Bilirubin Urine: NEGATIVE
Glucose, UA: NEGATIVE mg/dL
Ketones, ur: NEGATIVE mg/dL
Leukocytes,Ua: NEGATIVE
Nitrite: NEGATIVE
Protein, ur: NEGATIVE mg/dL
Specific Gravity, Urine: 1.012 (ref 1.005–1.030)
pH: 6 (ref 5.0–8.0)

## 2021-01-05 MED ORDER — GADOBUTROL 1 MMOL/ML IV SOLN
10.0000 mL | Freq: Once | INTRAVENOUS | Status: AC | PRN
  Administered 2021-01-05: 10 mL via INTRAVENOUS

## 2021-01-05 NOTE — ED Provider Notes (Signed)
I received this patient in signout from Dr. Dina Rich.  Briefly, patient presented with generalized weakness and dizziness for the past several days.  Had received MRI of the brain which was negative for stroke, showed chronic microhemorrhages.  After discussion with neurology, plan was to obtain MRA head and neck as well as MRI of cervical spine and to rediscuss with neurology once complete.  Imaging notable for chronic changes including slow flow vs occlusion of R vertebral artery. Discussed again w/ neuro, Dr. Theda Sers, who states that these findings are all chronic in nature and do not require acute intervention. He noted a goal BP of <130/80.   I discussed imaging results and neuro recommendations w/ patient and his daughter. Recommended f/u with his neurologist for dizziness sx as well as PCP for optimizing BP management. Daughter voiced understanding.    Disaya Walt, Wenda Overland, MD 01/05/21 [phone]

## 2021-01-05 NOTE — ED Notes (Signed)
Pt in mri 

## 2021-01-07 ENCOUNTER — Telehealth: Payer: Self-pay | Admitting: Nurse Practitioner

## 2021-01-07 NOTE — Telephone Encounter (Signed)
Rec'd call from patient's daughter, [name], and she said that she spoke with patient and he was in agreement with scheduling an appointment with our NP.  I have scheduled an In-home Palliative Consult for 01/21/21 @ 10:30 AM.

## 2021-01-21 ENCOUNTER — Encounter: Payer: Self-pay | Admitting: Nurse Practitioner

## 2021-01-21 ENCOUNTER — Other Ambulatory Visit: Payer: Self-pay | Admitting: Nurse Practitioner

## 2021-01-21 ENCOUNTER — Other Ambulatory Visit: Payer: Self-pay

## 2021-01-21 DIAGNOSIS — F0391 Unspecified dementia with behavioral disturbance: Secondary | ICD-10-CM

## 2021-01-21 DIAGNOSIS — Z515 Encounter for palliative care: Secondary | ICD-10-CM
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
## 2021-01-23 ENCOUNTER — Telehealth: Payer: Self-pay

## 2021-01-23 NOTE — Telephone Encounter (Signed)
At the direction of Palliative NP, message sent to Dr. Trena Platt office to request referral for Home Health PT.

## 2021-02-25 ENCOUNTER — Emergency Department (HOSPITAL_COMMUNITY): Payer: Medicare Other

## 2021-02-25 ENCOUNTER — Other Ambulatory Visit: Payer: Self-pay

## 2021-02-25 ENCOUNTER — Inpatient Hospital Stay (HOSPITAL_COMMUNITY)
Admission: EM | Admit: 2021-02-25 | Discharge: 2021-03-06 | DRG: 418 | Disposition: A | Discharge status: Home Health | Payer: Medicare Other | Attending: Internal Medicine | Admitting: Internal Medicine

## 2021-02-25 DIAGNOSIS — G309 Alzheimer's disease, unspecified: Secondary | ICD-10-CM | POA: Diagnosis present

## 2021-02-25 DIAGNOSIS — Z886 Allergy status to analgesic agent status: Secondary | ICD-10-CM

## 2021-02-25 DIAGNOSIS — I739 Peripheral vascular disease, unspecified: Secondary | ICD-10-CM | POA: Diagnosis present

## 2021-02-25 DIAGNOSIS — K810 Acute cholecystitis: Secondary | ICD-10-CM | POA: Diagnosis present

## 2021-02-25 DIAGNOSIS — Z85828 Personal history of other malignant neoplasm of skin: Secondary | ICD-10-CM

## 2021-02-25 DIAGNOSIS — R7401 Elevation of levels of liver transaminase levels: Secondary | ICD-10-CM

## 2021-02-25 DIAGNOSIS — R7989 Other specified abnormal findings of blood chemistry: Secondary | ICD-10-CM | POA: Diagnosis present

## 2021-02-25 DIAGNOSIS — Z66 Do not resuscitate: Secondary | ICD-10-CM | POA: Diagnosis present

## 2021-02-25 DIAGNOSIS — Z79890 Hormone replacement therapy (postmenopausal): Secondary | ICD-10-CM

## 2021-02-25 DIAGNOSIS — K859 Acute pancreatitis without necrosis or infection, unspecified: Secondary | ICD-10-CM | POA: Diagnosis present

## 2021-02-25 DIAGNOSIS — K449 Diaphragmatic hernia without obstruction or gangrene: Secondary | ICD-10-CM | POA: Diagnosis present

## 2021-02-25 DIAGNOSIS — I10 Essential (primary) hypertension: Secondary | ICD-10-CM | POA: Diagnosis present

## 2021-02-25 DIAGNOSIS — Z79899 Other long term (current) drug therapy: Secondary | ICD-10-CM

## 2021-02-25 DIAGNOSIS — K8062 Calculus of gallbladder and bile duct with acute cholecystitis without obstruction: Secondary | ICD-10-CM | POA: Diagnosis present

## 2021-02-25 DIAGNOSIS — Z8673 Personal history of transient ischemic attack (TIA), and cerebral infarction without residual deficits: Secondary | ICD-10-CM

## 2021-02-25 DIAGNOSIS — K851 Biliary acute pancreatitis without necrosis or infection: Secondary | ICD-10-CM | POA: Diagnosis not present

## 2021-02-25 DIAGNOSIS — Z8249 Family history of ischemic heart disease and other diseases of the circulatory system: Secondary | ICD-10-CM

## 2021-02-25 DIAGNOSIS — F0150 Vascular dementia without behavioral disturbance: Secondary | ICD-10-CM | POA: Diagnosis present

## 2021-02-25 DIAGNOSIS — F0280 Dementia in other diseases classified elsewhere without behavioral disturbance: Secondary | ICD-10-CM | POA: Diagnosis present

## 2021-02-25 DIAGNOSIS — N1832 Chronic kidney disease, stage 3b: Secondary | ICD-10-CM | POA: Diagnosis present

## 2021-02-25 DIAGNOSIS — I129 Hypertensive chronic kidney disease with stage 1 through stage 4 chronic kidney disease, or unspecified chronic kidney disease: Secondary | ICD-10-CM | POA: Diagnosis present

## 2021-02-25 DIAGNOSIS — E039 Hypothyroidism, unspecified: Secondary | ICD-10-CM | POA: Diagnosis present

## 2021-02-25 DIAGNOSIS — E559 Vitamin D deficiency, unspecified: Secondary | ICD-10-CM | POA: Diagnosis present

## 2021-02-25 DIAGNOSIS — M5136 Other intervertebral disc degeneration, lumbar region: Secondary | ICD-10-CM | POA: Diagnosis present

## 2021-02-25 DIAGNOSIS — A419 Sepsis, unspecified organism: Secondary | ICD-10-CM

## 2021-02-25 DIAGNOSIS — I459 Conduction disorder, unspecified: Secondary | ICD-10-CM | POA: Diagnosis present

## 2021-02-25 DIAGNOSIS — Z419 Encounter for procedure for purposes other than remedying health state, unspecified: Secondary | ICD-10-CM

## 2021-02-25 DIAGNOSIS — R109 Unspecified abdominal pain: Secondary | ICD-10-CM

## 2021-02-25 DIAGNOSIS — E785 Hyperlipidemia, unspecified: Secondary | ICD-10-CM | POA: Diagnosis present

## 2021-02-25 DIAGNOSIS — Z7902 Long term (current) use of antithrombotics/antiplatelets: Secondary | ICD-10-CM

## 2021-02-25 DIAGNOSIS — F17220 Nicotine dependence, chewing tobacco, uncomplicated: Secondary | ICD-10-CM | POA: Diagnosis present

## 2021-02-25 DIAGNOSIS — Z20822 Contact with and (suspected) exposure to covid-19: Secondary | ICD-10-CM | POA: Diagnosis present

## 2021-02-25 DIAGNOSIS — K8050 Calculus of bile duct without cholangitis or cholecystitis without obstruction: Secondary | ICD-10-CM

## 2021-02-25 DIAGNOSIS — K4090 Unilateral inguinal hernia, without obstruction or gangrene, not specified as recurrent: Secondary | ICD-10-CM | POA: Diagnosis present

## 2021-02-25 DIAGNOSIS — F0151 Vascular dementia with behavioral disturbance: Secondary | ICD-10-CM | POA: Diagnosis present

## 2021-02-25 DIAGNOSIS — K8689 Other specified diseases of pancreas: Secondary | ICD-10-CM | POA: Diagnosis present

## 2021-02-25 DIAGNOSIS — E538 Deficiency of other specified B group vitamins: Secondary | ICD-10-CM | POA: Diagnosis present

## 2021-02-25 DIAGNOSIS — F01518 Vascular dementia, unspecified severity, with other behavioral disturbance: Secondary | ICD-10-CM | POA: Diagnosis present

## 2021-02-25 DIAGNOSIS — F05 Delirium due to known physiological condition: Secondary | ICD-10-CM | POA: Diagnosis not present

## 2021-02-25 LAB — COMPREHENSIVE METABOLIC PANEL
ALT: 85 U/L — ABNORMAL HIGH (ref 0–44)
AST: 215 U/L — ABNORMAL HIGH (ref 15–41)
Albumin: 3.4 g/dL — ABNORMAL LOW (ref 3.5–5.0)
Alkaline Phosphatase: 98 U/L (ref 38–126)
Anion gap: 6 (ref 5–15)
BUN: 23 mg/dL (ref 8–23)
CO2: 26 mmol/L (ref 22–32)
Calcium: 8.8 mg/dL — ABNORMAL LOW (ref 8.9–10.3)
Chloride: 108 mmol/L (ref 98–111)
Creatinine, Ser: 1.71 mg/dL — ABNORMAL HIGH (ref 0.61–1.24)
GFR, Estimated: 38 mL/min — ABNORMAL LOW (ref >60)
Glucose, Bld: 102 mg/dL — ABNORMAL HIGH (ref 70–99)
Potassium: 3.7 mmol/L (ref 3.5–5.1)
Sodium: 140 mmol/L (ref 135–145)
Total Bilirubin: 1.6 mg/dL — ABNORMAL HIGH (ref 0.3–1.2)
Total Protein: 6.7 g/dL (ref 6.5–8.1)

## 2021-02-25 LAB — CBC WITH DIFFERENTIAL/PLATELET
Abs Immature Granulocytes: 0.04 10*3/uL (ref 0.00–0.07)
Basophils Absolute: 0.1 10*3/uL (ref 0.0–0.1)
Basophils Relative: 1 %
Eosinophils Absolute: 0.1 10*3/uL (ref 0.0–0.5)
Eosinophils Relative: 1 %
HCT: 37 % — ABNORMAL LOW (ref 39.0–52.0)
Hemoglobin: 12.1 g/dL — ABNORMAL LOW (ref 13.0–17.0)
Immature Granulocytes: 0 %
Lymphocytes Relative: 13 %
Lymphs Abs: 1.4 10*3/uL (ref 0.7–4.0)
MCH: 30.3 pg (ref 26.0–34.0)
MCHC: 32.7 g/dL (ref 30.0–36.0)
MCV: 92.5 fL (ref 80.0–100.0)
Monocytes Absolute: 0.5 10*3/uL (ref 0.1–1.0)
Monocytes Relative: 5 %
Neutro Abs: 8.6 10*3/uL — ABNORMAL HIGH (ref 1.7–7.7)
Neutrophils Relative %: 80 %
Platelets: 265 10*3/uL (ref 150–400)
RBC: 4 MIL/uL — ABNORMAL LOW (ref 4.22–5.81)
RDW: 13.2 % (ref 11.5–15.5)
WBC: 10.6 10*3/uL — ABNORMAL HIGH (ref 4.0–10.5)
nRBC: 0 % (ref 0.0–0.2)

## 2021-02-25 LAB — URINALYSIS, ROUTINE W REFLEX MICROSCOPIC
Bacteria, UA: NONE SEEN
Bilirubin Urine: NEGATIVE
Glucose, UA: NEGATIVE mg/dL
Ketones, ur: NEGATIVE mg/dL
Leukocytes,Ua: NEGATIVE
Nitrite: NEGATIVE
Protein, ur: 30 mg/dL — AB
Specific Gravity, Urine: 1.012 (ref 1.005–1.030)
pH: 6 (ref 5.0–8.0)

## 2021-02-25 LAB — LACTIC ACID, PLASMA: Lactic Acid, Venous: 1.6 mmol/L (ref 0.5–1.9)

## 2021-02-25 LAB — LIPASE, BLOOD: Lipase: 3570 U/L — ABNORMAL HIGH (ref 11–51)

## 2021-02-25 MED ORDER — METRONIDAZOLE IN NACL 5-0.79 MG/ML-% IV SOLN
500.0000 mg | Freq: Once | INTRAVENOUS | Status: AC
  Administered 2021-02-26: 500 mg via INTRAVENOUS
  Filled 2021-02-25: qty 100

## 2021-02-25 MED ORDER — SODIUM CHLORIDE 0.9 % IV SOLN
2.0000 g | Freq: Once | INTRAVENOUS | Status: AC
  Administered 2021-02-26: 2 g via INTRAVENOUS
  Filled 2021-02-25: qty 2

## 2021-02-25 MED ORDER — LACTATED RINGERS IV SOLN: INTRAVENOUS | Status: DC

## 2021-02-25 MED ORDER — LACTATED RINGERS IV BOLUS
1000.0000 mL | Freq: Once | INTRAVENOUS | Status: AC
  Administered 2021-02-25: 1000 mL via INTRAVENOUS

## 2021-02-25 NOTE — ED Notes (Signed)
Patient transported to CT 

## 2021-02-25 NOTE — ED Notes (Signed)
Patient transported to Ultrasound 

## 2021-02-25 NOTE — ED Provider Notes (Signed)
[name] Provider Note   CSN: [id number] Arrival date & time: 02/25/21  2035     History Chief Complaint  Patient presents with  . Abdominal Pain    [name] is a 87 y.o. male.   Abdominal Pain Pain location:  LLQ Pain quality: aching   Pain radiates to:  Does not radiate Pain severity:  Moderate Onset quality:  Gradual Timing:  Constant Progression:  Resolved Chronicity:  New Context: eating   Relieved by:  Nothing Worsened by:  Nothing Ineffective treatments:  None tried Associated symptoms: constipation   Associated symptoms: no anorexia, no chest pain, no chills, no cough, no diarrhea, no dysuria, no fever, no nausea, no shortness of breath and no vomiting        Past Medical History:  Diagnosis Date  . Anemia   . Bursitis   . Cancer ([place])    skin cancer face  . Chronic kidney disease   . Dementia ([place])   . Dyspnea   . GI bleed   . History of hiatal hernia   . Hyperlipidemia   . Hypertension   . IVCD (intraventricular conduction defect)   . Leg weakness, bilateral   . Obesity   . Peripheral vascular disease ([place])   . Stomach ulcer   . Stroke [name])    feb 2017, weakness in legs,   . Tremor   . Vitamin B 12 deficiency   . Vitamin D deficiency     Patient Active Problem List   Diagnosis Date Noted  . PAD (peripheral artery disease) ([place]) 06/21/2020  . Degenerative disc disease, lumbar 04/19/2020  . Hiatal hernia 04/19/2020  . Obesity 04/19/2020  . Upper GI bleed 04/19/2020  . Abdominal pain 04/19/2020  . Pain in limb 04/19/2020  . Carotid stenosis 04/19/2020  . TIA (transient ischemic attack) 03/10/2020  . Hyperlipidemia 03/10/2020  . HTN (hypertension) 03/10/2020  . Schatzki's ring 12/02/2018  . Constipation due to slow transit 10/06/2018  . Stool incontinence 10/06/2018  . 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 Vitamin D deficiency  06/22/2018  . Carpal tunnel syndrome, right 07/13/2017  . Diarrhea 04/20/2017  . Bursitis of shoulder, right 02/22/2017  . Rotator cuff tendinitis, right 02/22/2017  . B12 deficiency 10/12/2016  . CKD (chronic kidney disease) stage 3, GFR 30-59 ml/min (HCC) 10/12/2016  . Lumbar stenosis with neurogenic claudication 08/12/2016  . IVCD (intraventricular conduction defect) 07/13/2016  . Preop cardiovascular exam 07/13/2016  . SOB (shortness of breath) on exertion 07/13/2016  . Polyneuropathy 05/25/2016  . Benign essential tremor 04/08/2016  . Leg weakness, bilateral 04/08/2016  . Hypertensive emergency 12/13/2015  . 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 Acute intracerebral hemorrhage ([place]) 12/12/2015  . Anemia 06/25/2014    Past Surgical History:  Procedure Laterality Date  . BACK SURGERY    . ESOPHAGOGASTRODUODENOSCOPY (EGD) WITH PROPOFOL N/A 11/09/2018   Procedure: ESOPHAGOGASTRODUODENOSCOPY (EGD) WITH PROPOFOL;  Surgeon: Manya Silvas, MD;  Location[facility] ENDOSCOPY;  Service: Endoscopy;  Laterality: N/A;  . EYE SURGERY      bilateral cataracts  . HERNIA REPAIR     inguinal on left  . INGUINAL HERNIA REPAIR Right 12/29/2017   Procedure: HERNIA REPAIR INGUINAL ADULT;  Surgeon: Herbert Pun, MD;  Location: [facility] ORS;  Service: General;  Laterality: Right;  . INSERTION OF MESH Right 12/29/2017   Procedure: INSERTION OF MESH;  Surgeon: Herbert Pun, MD;  Location: [facility] ORS;  Service:  General;  Laterality: Right;  . LUMBAR LAMINECTOMY/DECOMPRESSION MICRODISCECTOMY N/A 08/12/2016   Procedure: LAMINECTOMY AND FORAMINOTOMY LUMBAR FOUR - LUMBAR FIVE;  Surgeon: Newman Pies, MD;  Location: [place];  Service: Neurosurgery;  Laterality: N/A;  LAMINECTOMY AND FORAMINOTOMY L4-L5       Family History  Problem Relation Age of Onset  . CAD Mother   . Cancer Father     Social History   Tobacco Use  . Smoking status: Never Smoker  . Smokeless  tobacco: Current User    Types: Chew  Vaping Use  . Vaping Use: Never used  Substance Use Topics  . Alcohol use: No  . Drug use: No    Home Medications Prior to Admission medications   Medication Sig Start Date End Date Taking? Authorizing Provider  acetaminophen (TYLENOL) 500 MG tablet Take 500 mg by mouth daily as needed for moderate pain or headache.    [provider]  cholecalciferol (VITAMIN D) 1000 units tablet Take 2,000 Units by mouth daily.     [provider]  clopidogrel (PLAVIX) 75 MG tablet Take 75 mg by mouth daily. 05/13/20   [provider]  cyanocobalamin (,VITAMIN B-12,) 1000 MCG/ML injection Inject 1,000 mcg into the muscle every 30 (thirty) days.    [provider]  ezetimibe (ZETIA) 10 MG tablet Take 10 mg by mouth at bedtime. 11/05/20   [provider]  isosorbide mononitrate (IMDUR) 30 MG 24 hr tablet Take 30 mg by mouth daily. 12/09/20   [provider]  levothyroxine (SYNTHROID) 50 MCG tablet Take 50 mcg by mouth daily. 11/08/20   [provider]  metoprolol succinate (TOPROL-XL) 25 MG 24 hr tablet Take 12.5 mg by mouth in the morning.  02/27/20 02/26/21  [provider]  pantoprazole (PROTONIX) 40 MG tablet Take 1 tablet (40 mg total) by mouth daily. 03/11/20 06/21/20  Arrien, Jimmy Picket, MD    Allergies    Aspirin  Review of Systems   Review of Systems  Constitutional: Negative for chills and fever.  HENT: Negative for congestion and rhinorrhea.   Respiratory: Negative for cough and shortness of breath.   Cardiovascular: Negative for chest pain and palpitations.  Gastrointestinal: Positive for abdominal pain and constipation. Negative for anorexia, diarrhea, nausea and vomiting.  Genitourinary: Negative for difficulty urinating and dysuria.  Musculoskeletal: Negative for arthralgias and back pain.  Skin: Negative for color change and rash.  Neurological: Negative for light-headedness and  headaches.    Physical Exam Updated Vital Signs BP (!) 170/98   Pulse (!) 103   Temp 98.2 F (36.8 C) (Oral)   Resp (!) 24   Ht 6\' 1"  (1.854 m)   Wt 100.7 kg   SpO2 96%   BMI 29.29 kg/m   Physical Exam Vitals and nursing note reviewed.  Constitutional:      General: He is not in acute distress.    Appearance: Normal appearance.  HENT:     Head: Normocephalic and atraumatic.     Nose: No rhinorrhea.  Eyes:     General:        Right eye: No discharge.        Left eye: No discharge.     Conjunctiva/sclera: Conjunctivae normal.  Cardiovascular:     Rate and Rhythm: Normal rate and regular rhythm.  Pulmonary:     Effort: Pulmonary effort is normal.     Breath sounds: No stridor.  Abdominal:     General: Abdomen is flat. There is  distension.     Palpations: Abdomen is soft.     Tenderness: There is generalized abdominal tenderness. There is no guarding or rebound.  Musculoskeletal:        General: No deformity or signs of injury.  Skin:    General: Skin is warm and dry.  Neurological:     General: No focal deficit present.     Mental Status: He is alert. Mental status is at baseline.     Motor: No weakness.  Psychiatric:        Mood and Affect: Mood normal.        Behavior: Behavior normal.        Thought Content: Thought content normal.     ED Results / Procedures / Treatments   Labs (all labs ordered are listed, but only abnormal results are displayed) Labs Reviewed  CBC WITH DIFFERENTIAL/PLATELET - Abnormal; Notable for the following components:      Result Value   WBC 10.6 (*)    RBC 4.00 (*)    Hemoglobin 12.1 (*)    HCT 37.0 (*)    Neutro Abs 8.6 (*)    All other components within normal limits  COMPREHENSIVE METABOLIC PANEL - Abnormal; Notable for the following components:   Glucose, Bld 102 (*)    Creatinine, Ser 1.71 (*)    Calcium 8.8 (*)    Albumin 3.4 (*)    AST 215 (*)    ALT 85 (*)    Total Bilirubin 1.6 (*)    GFR, Estimated 38 (*)     All other components within normal limits  LIPASE, BLOOD - Abnormal; Notable for the following components:   Lipase 3,570 (*)    All other components within normal limits  URINALYSIS, ROUTINE W REFLEX MICROSCOPIC - Abnormal; Notable for the following components:   Hgb urine dipstick MODERATE (*)    Protein, ur 30 (*)    All other components within normal limits  RESP PANEL BY RT-PCR (FLU A&B, COVID) ARPGX2  CULTURE, BLOOD (ROUTINE X 2)  CULTURE, BLOOD (ROUTINE X 2)  LACTIC ACID, PLASMA  LACTIC ACID, PLASMA  HEPATITIS PANEL, ACUTE    EKG None  Radiology CT ABDOMEN PELVIS WO CONTRAST  Result Date: 02/25/2021 CLINICAL DATA:  Left lower quadrant abdominal pain. EXAM: CT ABDOMEN AND PELVIS WITHOUT CONTRAST TECHNIQUE: Multidetector CT imaging of the abdomen and pelvis was performed following the standard protocol without IV contrast. COMPARISON:  05/19/2006 FINDINGS: Lower chest: Emphysematous changes and mild interstitial fibrosis in the lung bases. Small esophageal hiatal hernia. Hepatobiliary: No focal liver lesions. Gallbladder is distended with evidence of gallbladder wall thickening and edema. No stones identified. Changes could indicate acute cholecystitis. No bile duct dilatation. Pancreas: Unremarkable. No pancreatic ductal dilatation or surrounding inflammatory changes. Spleen: Normal in size without focal abnormality. Adrenals/Urinary Tract: Adrenal glands are unremarkable. Kidneys are normal, without renal calculi, focal lesion, or hydronephrosis. Bladder is unremarkable. Stomach/Bowel: Stomach, small bowel, and colon are not abnormally distended. Scattered stool throughout the colon. No wall thickening or inflammatory changes appreciated. Appendix is normal. There is a moderate-sized left inguinal hernia containing fat and sigmoid colon. No proximal colonic obstruction. Vascular/Lymphatic: Aortic atherosclerosis. No enlarged abdominal or pelvic lymph nodes. Reproductive: Prostate gland  is mildly enlarged. Other: No free air or free fluid in the abdomen. Musculoskeletal: Prominent degenerative changes in the spine. No destructive bone lesions. IMPRESSION: 1. Distended gallbladder with gallbladder wall thickening and edema. No stones identified. Changes suggest acute cholecystitis. 2. Moderate-sized left  inguinal hernia containing fat and sigmoid colon. No proximal colonic obstruction. 3. Emphysematous changes and mild interstitial fibrosis in the lung bases. 4. Small esophageal hiatal hernia. 5. Aortic atherosclerosis. 6. Mildly enlarged prostate gland. Aortic Atherosclerosis (ICD10-I70.0) and Emphysema (ICD10-J43.9). Electronically Signed   By: Lucienne Capers M.D.   On: 02/25/2021 23:32    Procedures .Critical Care E&M Performed by: Breck Coons, MD  Critical care provider statement:    Critical care time (minutes):  45   Critical care was necessary to treat or prevent imminent or life-threatening deterioration of the following conditions:  Sepsis   Critical care was time spent personally by me on the following activities:  Blood draw for specimens, development of treatment plan with patient or surrogate, discussions with consultants, evaluation of patient's response to treatment, examination of patient, obtaining history from patient or surrogate, ordering and performing treatments and interventions, ordering and review of laboratory studies, re-evaluation of patient's condition and review of old charts   Care discussed with: admitting provider   After initial E/M assessment, critical care services were subsequently performed that were exclusive of separately billable procedures or treatment.       Medications Ordered in ED Medications  lactated ringers infusion (has no administration in time range)  ceFEPIme (MAXIPIME) 2 g in sodium chloride 0.9 % 100 mL IVPB (has no administration in time range)  metroNIDAZOLE (FLAGYL) IVPB 500 mg (has no administration in time range)   lactated ringers bolus 1,000 mL (1,000 mLs Intravenous New Bag/Given 02/25/21 2343)    ED Course  I have reviewed the triage vital signs and the nursing notes.  Pertinent labs & imaging results that were available during my care of the patient were reviewed by me and considered in my medical decision making (see chart for details).    MDM Rules/Calculators/A&P                          Sudden onset now resolved abdominal pain.  Due to age will likely need advanced imaging.  We will get screening labs to check for organ function electrolyte abnormalities.  CT imaging reviewed by radiology myself shows a thickened gallbladder wall with edema concern for acute cholecystitis.  No other changes.  Mild rising creatinine based on labs, given IV fluids.  Patient is heart rate and respiratory rate are rising.  There is a slight leukocytosis.  With this I have concerns for inflammatory changes that could be sepsis related.  He will get broad-spectrum antibiotics cultures will be drawn remained stable.  He will go for ultrasound.  He will need medicine admission possible gastroenterology consultation for further diagnostic testing.  Lipase is elevated as well.  CRITICAL CARE Performed by: Breck Coons   Total critical care time: 45 minutes  Critical care time was exclusive of separately billable procedures and treating other patients.  Critical care was necessary to treat or prevent imminent or life-threatening deterioration.  Critical care was time spent personally by me on the following activities: development of treatment plan with patient and/or surrogate as well as nursing, discussions with consultants, evaluation of patient's response to treatment, examination of patient, obtaining history from patient or surrogate, ordering and performing treatments and interventions, ordering and review of laboratory studies, ordering and review of radiographic studies, pulse oximetry and re-evaluation of  patient's condition.  Final Clinical Impression(s) / ED Diagnoses Final diagnoses:  Abdominal pain  Acute pancreatitis, unspecified complication status, unspecified pancreatitis type  Transaminitis  Sepsis, due to unspecified organism, unspecified whether acute organ dysfunction present [facility])    Rx / DC Orders ED Discharge Orders    None       Breck Coons, MD 02/26/21 [phone]

## 2021-02-25 NOTE — ED Triage Notes (Signed)
Brought in by [facility] from home - c/o LLQ pain radiating to the right. Pain started after eating 2 bites of sandwich. Pt also has urinary incontinence.

## 2021-02-26 ENCOUNTER — Inpatient Hospital Stay (HOSPITAL_COMMUNITY): Payer: Medicare Other

## 2021-02-26 ENCOUNTER — Encounter (HOSPITAL_COMMUNITY): Payer: Self-pay | Admitting: Family Medicine

## 2021-02-26 DIAGNOSIS — F05 Delirium due to known physiological condition: Secondary | ICD-10-CM | POA: Diagnosis not present

## 2021-02-26 DIAGNOSIS — K810 Acute cholecystitis: Secondary | ICD-10-CM | POA: Diagnosis not present

## 2021-02-26 DIAGNOSIS — G309 Alzheimer's disease, unspecified: Secondary | ICD-10-CM | POA: Diagnosis present

## 2021-02-26 DIAGNOSIS — E538 Deficiency of other specified B group vitamins: Secondary | ICD-10-CM | POA: Diagnosis not present

## 2021-02-26 DIAGNOSIS — Z8249 Family history of ischemic heart disease and other diseases of the circulatory system: Secondary | ICD-10-CM | POA: Diagnosis not present

## 2021-02-26 DIAGNOSIS — I10 Essential (primary) hypertension: Secondary | ICD-10-CM | POA: Diagnosis not present

## 2021-02-26 DIAGNOSIS — Z20822 Contact with and (suspected) exposure to covid-19: Secondary | ICD-10-CM | POA: Diagnosis not present

## 2021-02-26 DIAGNOSIS — R7989 Other specified abnormal findings of blood chemistry: Secondary | ICD-10-CM | POA: Diagnosis not present

## 2021-02-26 DIAGNOSIS — E785 Hyperlipidemia, unspecified: Secondary | ICD-10-CM | POA: Diagnosis not present

## 2021-02-26 DIAGNOSIS — K859 Acute pancreatitis without necrosis or infection, unspecified: Secondary | ICD-10-CM | POA: Diagnosis present

## 2021-02-26 DIAGNOSIS — R935 Abnormal findings on diagnostic imaging of other abdominal regions, including retroperitoneum: Secondary | ICD-10-CM

## 2021-02-26 DIAGNOSIS — F0280 Dementia in other diseases classified elsewhere without behavioral disturbance: Secondary | ICD-10-CM | POA: Diagnosis present

## 2021-02-26 DIAGNOSIS — N1832 Chronic kidney disease, stage 3b: Secondary | ICD-10-CM | POA: Diagnosis not present

## 2021-02-26 DIAGNOSIS — I129 Hypertensive chronic kidney disease with stage 1 through stage 4 chronic kidney disease, or unspecified chronic kidney disease: Secondary | ICD-10-CM | POA: Diagnosis not present

## 2021-02-26 DIAGNOSIS — K449 Diaphragmatic hernia without obstruction or gangrene: Secondary | ICD-10-CM | POA: Diagnosis present

## 2021-02-26 DIAGNOSIS — I459 Conduction disorder, unspecified: Secondary | ICD-10-CM | POA: Diagnosis present

## 2021-02-26 DIAGNOSIS — Z8673 Personal history of transient ischemic attack (TIA), and cerebral infarction without residual deficits: Secondary | ICD-10-CM | POA: Diagnosis not present

## 2021-02-26 DIAGNOSIS — R1084 Generalized abdominal pain: Secondary | ICD-10-CM | POA: Diagnosis not present

## 2021-02-26 DIAGNOSIS — E559 Vitamin D deficiency, unspecified: Secondary | ICD-10-CM | POA: Diagnosis not present

## 2021-02-26 DIAGNOSIS — M5136 Other intervertebral disc degeneration, lumbar region: Secondary | ICD-10-CM | POA: Diagnosis not present

## 2021-02-26 DIAGNOSIS — F0151 Vascular dementia with behavioral disturbance: Secondary | ICD-10-CM

## 2021-02-26 DIAGNOSIS — F17220 Nicotine dependence, chewing tobacco, uncomplicated: Secondary | ICD-10-CM | POA: Diagnosis present

## 2021-02-26 DIAGNOSIS — K8689 Other specified diseases of pancreas: Secondary | ICD-10-CM | POA: Diagnosis present

## 2021-02-26 DIAGNOSIS — K851 Biliary acute pancreatitis without necrosis or infection: Secondary | ICD-10-CM | POA: Diagnosis not present

## 2021-02-26 DIAGNOSIS — K3189 Other diseases of stomach and duodenum: Secondary | ICD-10-CM | POA: Diagnosis not present

## 2021-02-26 DIAGNOSIS — Z85828 Personal history of other malignant neoplasm of skin: Secondary | ICD-10-CM | POA: Diagnosis not present

## 2021-02-26 DIAGNOSIS — Z7902 Long term (current) use of antithrombotics/antiplatelets: Secondary | ICD-10-CM

## 2021-02-26 DIAGNOSIS — K838 Other specified diseases of biliary tract: Secondary | ICD-10-CM | POA: Diagnosis not present

## 2021-02-26 DIAGNOSIS — K4090 Unilateral inguinal hernia, without obstruction or gangrene, not specified as recurrent: Secondary | ICD-10-CM | POA: Diagnosis present

## 2021-02-26 DIAGNOSIS — I739 Peripheral vascular disease, unspecified: Secondary | ICD-10-CM | POA: Diagnosis not present

## 2021-02-26 DIAGNOSIS — Z66 Do not resuscitate: Secondary | ICD-10-CM | POA: Diagnosis not present

## 2021-02-26 DIAGNOSIS — K8050 Calculus of bile duct without cholangitis or cholecystitis without obstruction: Secondary | ICD-10-CM | POA: Diagnosis not present

## 2021-02-26 DIAGNOSIS — E039 Hypothyroidism, unspecified: Secondary | ICD-10-CM | POA: Diagnosis present

## 2021-02-26 DIAGNOSIS — F0150 Vascular dementia without behavioral disturbance: Secondary | ICD-10-CM | POA: Diagnosis present

## 2021-02-26 DIAGNOSIS — K8062 Calculus of gallbladder and bile duct with acute cholecystitis without obstruction: Secondary | ICD-10-CM | POA: Diagnosis not present

## 2021-02-26 LAB — CBC
HCT: 35 % — ABNORMAL LOW (ref 39.0–52.0)
Hemoglobin: 11.7 g/dL — ABNORMAL LOW (ref 13.0–17.0)
MCH: 30.5 pg (ref 26.0–34.0)
MCHC: 33.4 g/dL (ref 30.0–36.0)
MCV: 91.4 fL (ref 80.0–100.0)
Platelets: 226 10*3/uL (ref 150–400)
RBC: 3.83 MIL/uL — ABNORMAL LOW (ref 4.22–5.81)
RDW: 13.3 % (ref 11.5–15.5)
WBC: 7.4 10*3/uL (ref 4.0–10.5)
nRBC: 0 % (ref 0.0–0.2)

## 2021-02-26 LAB — COMPREHENSIVE METABOLIC PANEL
ALT: 355 U/L — ABNORMAL HIGH (ref 0–44)
AST: 790 U/L — ABNORMAL HIGH (ref 15–41)
Albumin: 3.2 g/dL — ABNORMAL LOW (ref 3.5–5.0)
Alkaline Phosphatase: 128 U/L — ABNORMAL HIGH (ref 38–126)
Anion gap: 11 (ref 5–15)
BUN: 20 mg/dL (ref 8–23)
CO2: 24 mmol/L (ref 22–32)
Calcium: 8.7 mg/dL — ABNORMAL LOW (ref 8.9–10.3)
Chloride: 103 mmol/L (ref 98–111)
Creatinine, Ser: 1.63 mg/dL — ABNORMAL HIGH (ref 0.61–1.24)
GFR, Estimated: 40 mL/min — ABNORMAL LOW (ref >60)
Glucose, Bld: 142 mg/dL — ABNORMAL HIGH (ref 70–99)
Potassium: 3.4 mmol/L — ABNORMAL LOW (ref 3.5–5.1)
Sodium: 138 mmol/L (ref 135–145)
Total Bilirubin: 2.3 mg/dL — ABNORMAL HIGH (ref 0.3–1.2)
Total Protein: 6 g/dL — ABNORMAL LOW (ref 6.5–8.1)

## 2021-02-26 LAB — HEPATITIS PANEL, ACUTE
HCV Ab: REACTIVE — AB
Hep A IgM: NONREACTIVE
Hep B C IgM: NONREACTIVE
Hepatitis B Surface Ag: NONREACTIVE

## 2021-02-26 LAB — RESP PANEL BY RT-PCR (FLU A&B, COVID) ARPGX2
Influenza A by PCR: NEGATIVE
Influenza B by PCR: NEGATIVE
SARS Coronavirus 2 by RT PCR: NEGATIVE

## 2021-02-26 LAB — SURGICAL PCR SCREEN
MRSA, PCR: NEGATIVE
Staphylococcus aureus: NEGATIVE

## 2021-02-26 LAB — LACTIC ACID, PLASMA: Lactic Acid, Venous: 1.9 mmol/L (ref 0.5–1.9)

## 2021-02-26 MED ORDER — LACTATED RINGERS IV SOLN: INTRAVENOUS | Status: AC

## 2021-02-26 MED ORDER — LABETALOL HCL 5 MG/ML IV SOLN
10.0000 mg | INTRAVENOUS | Status: DC | PRN
  Administered 2021-02-28: 10 mg via INTRAVENOUS
  Filled 2021-02-26: qty 4

## 2021-02-26 MED ORDER — FENTANYL CITRATE (PF) 100 MCG/2ML IJ SOLN
12.5000 ug | INTRAMUSCULAR | Status: DC | PRN
  Administered 2021-02-28: 12.5 ug via INTRAVENOUS
  Filled 2021-02-26: qty 2

## 2021-02-26 MED ORDER — ONDANSETRON HCL 4 MG PO TABS: 4.0000 mg | ORAL_TABLET | Freq: Four times a day (QID) | ORAL | Status: DC | PRN

## 2021-02-26 MED ORDER — ONDANSETRON HCL 4 MG/2ML IJ SOLN: 4.0000 mg | Freq: Four times a day (QID) | INTRAMUSCULAR | Status: DC | PRN

## 2021-02-26 MED ORDER — PIPERACILLIN-TAZOBACTAM 3.375 G IVPB
3.3750 g | Freq: Three times a day (TID) | INTRAVENOUS | Status: DC
  Administered 2021-02-26 – 2021-03-04 (×19): 3.375 g via INTRAVENOUS
  Filled 2021-02-26 (×20): qty 50

## 2021-02-26 NOTE — Sepsis Progress Note (Signed)
Following for Code Sepsis  

## 2021-02-26 NOTE — Progress Notes (Signed)
[facility] night shift.  The staff reports that the patient is still NPO and IV fluids have expired. LR at 88 mm/h x 12 hours ordered.  Tennis Must, MD

## 2021-02-26 NOTE — Consult Note (Signed)
Consult Note  Leroy [name] 11-28-1929  [id number].    Requesting MD: Dr. Scarlette Shorts  Chief Complaint/Reason for Consult: acute gallstone pancreatitis   HPI:  Patient is an 87 year old male who presented to ED yesterday with abdominal pain. Patient's wife reported that the complained of severe upper abdominal pain and she noted that he seemed bloated across upper abdomen. Denied fever, chills, chest pain, SOB, nausea, vomiting, diarrhea. Patient reportedly had not complained of pain the day prior but had also been less willing to go visit his stepson with his wife. PMH significant for dementia, Hx of CVA/TIA, HTN, HLD, CKD, hiatal hernia, hx of gastric ulcers. Patient is on plavix for hx of strokes, last dose was 4/26 AM. Prior abdominal surgery includes right inguinal hernia repair with mesh and prior left inguinal hernia repair. Patient is very functional at baseline and able to complete the majority of his daily activities without assistance. He is accompanied by his wife and his daughter who is his POA. At time of our evaluation abdominal pain seems to have resolved.   ROS: Review of Systems  Constitutional: Negative for chills and fever.  Respiratory: Negative for shortness of breath and wheezing.   Cardiovascular: Negative for chest pain and palpitations.  Gastrointestinal: Positive for abdominal pain. Negative for diarrhea, nausea and vomiting.  All other systems reviewed and are negative.   Family History  Problem Relation Age of Onset  . CAD Mother   . Cancer Father     Past Medical History:  Diagnosis Date  . Anemia   . Bursitis   . Cancer ([place])    skin cancer face  . Chronic kidney disease   . Dementia ([place])   . Dyspnea   . GI bleed   . History of hiatal hernia   . Hyperlipidemia   . Hypertension   . IVCD (intraventricular conduction defect)   . Leg weakness, bilateral   . Obesity   . Peripheral vascular disease ([place])   . Stomach ulcer   . Stroke  [facility])    feb 2017, weakness in legs,   . Tremor   . Vitamin B 12 deficiency   . Vitamin D deficiency     Past Surgical History:  Procedure Laterality Date  . BACK SURGERY    . ESOPHAGOGASTRODUODENOSCOPY (EGD) WITH PROPOFOL N/A 11/09/2018   Procedure: ESOPHAGOGASTRODUODENOSCOPY (EGD) WITH PROPOFOL;  Surgeon: Manya Silvas, MD;  Location: [facility] ENDOSCOPY;  Service: Endoscopy;  Laterality: N/A;  . EYE SURGERY      bilateral cataracts  . HERNIA REPAIR     inguinal on left  . INGUINAL HERNIA REPAIR Right 12/29/2017   Procedure: HERNIA REPAIR INGUINAL ADULT;  Surgeon: Herbert Pun, MD;  Location: [facility] ORS;  Service: General;  Laterality: Right;  . INSERTION OF MESH Right 12/29/2017   Procedure: INSERTION OF MESH;  Surgeon: Herbert Pun, MD;  Location: [facility] ORS;  Service: General;  Laterality: Right;  . LUMBAR LAMINECTOMY/DECOMPRESSION MICRODISCECTOMY N/A 08/12/2016   Procedure: LAMINECTOMY AND FORAMINOTOMY LUMBAR FOUR - LUMBAR FIVE;  Surgeon: Newman Pies, MD;  Location: [facility];  Service: Neurosurgery;  Laterality: N/A;  LAMINECTOMY AND FORAMINOTOMY L4-L5    Social History:  reports that he has never smoked. His smokeless tobacco use includes chew. He reports that he does not drink alcohol and does not use drugs.  Allergies:  Allergies  Allergen Reactions  . Aspirin Other (See Comments)    He has bleeding ulcers  Medications Prior to Admission  Medication Sig Dispense Refill  . acetaminophen (TYLENOL) 500 MG tablet Take 500 mg by mouth daily as needed for moderate pain or headache.    . cholecalciferol (VITAMIN D) 1000 units tablet Take 2,000 Units by mouth daily.     . clopidogrel (PLAVIX) 75 MG tablet Take 75 mg by mouth daily.    . cyanocobalamin (,VITAMIN B-12,) 1000 MCG/ML injection Inject 1,000 mcg into the muscle every 30 (thirty) days.    Marland Kitchen ezetimibe (ZETIA) 10 MG tablet Take 10 mg by mouth at bedtime.    . isosorbide mononitrate (IMDUR) 30 MG 24 hr  tablet Take 30 mg by mouth daily.    Marland Kitchen levothyroxine (SYNTHROID) 50 MCG tablet Take 50 mcg by mouth daily.    . metoprolol succinate (TOPROL-XL) 25 MG 24 hr tablet Take 12.5 mg by mouth in the morning.     . pantoprazole (PROTONIX) 40 MG tablet Take 1 tablet (40 mg total) by mouth daily. 30 tablet 0    Blood pressure (!) 156/86, pulse 77, temperature 98.6 F (37 C), temperature source Oral, resp. rate 18, height 6\' 1"  (1.854 m), weight 100.7 kg, SpO2 96 %. Physical Exam:  General: pleasant, WD, WN male who is laying in bed in NAD HEENT: head is normocephalic, atraumatic.  Sclera are anicteric.  Poor dentition  Heart: regular, rate, and rhythm.  Normal s1,s2. No obvious murmurs, gallops, or rubs noted.  Palpable radial and pedal pulses bilaterally Lungs: CTAB, no wheezes, rhonchi, or rales noted.  Respiratory effort nonlabored Abd: soft, NT, ND, +BS, unable to palpate reducible left inguinal hernia  MS: all 4 extremities are symmetrical with no cyanosis, clubbing, or edema. Skin: warm and dry with no masses, lesions, or rashes Neuro: Cranial nerves 2-12 grossly intact, sensation is normal throughout Psych: A&O to self, place and situation with an appropriate affect.   Results for orders placed or performed during the hospital encounter of 02/25/21 (from the past 48 hour(s))  Urinalysis, Routine w reflex microscopic Urine, Clean Catch     Status: Abnormal   Collection Time: 02/25/21  8:45 PM  Result Value Ref Range   Color, Urine YELLOW YELLOW   APPearance CLEAR CLEAR   Specific Gravity, Urine 1.012 1.005 - 1.030   pH 6.0 5.0 - 8.0   Glucose, UA NEGATIVE NEGATIVE mg/dL   Hgb urine dipstick MODERATE (A) NEGATIVE   Bilirubin Urine NEGATIVE NEGATIVE   Ketones, ur NEGATIVE NEGATIVE mg/dL   Protein, ur 30 (A) NEGATIVE mg/dL   Nitrite NEGATIVE NEGATIVE   Leukocytes,Ua NEGATIVE NEGATIVE   RBC / HPF 6-10 0 - 5 RBC/hpf   WBC, UA 0-5 0 - 5 WBC/hpf   Bacteria, UA NONE SEEN NONE SEEN    Squamous Epithelial / LPF 0-5 0 - 5   Mucus PRESENT     Comment: Performed at [facility], [place] [address], [place], [place] [address]  Lactic acid, plasma     Status: None   Collection Time: 02/25/21  9:29 PM  Result Value Ref Range   Lactic Acid, Venous 1.6 0.5 - 1.9 mmol/L    Comment: Performed at [facility] [address] [place], [place] [address]  CBC with Differential     Status: Abnormal   Collection Time: 02/25/21  9:30 PM  Result Value Ref Range   WBC 10.6 (H) 4.0 - 10.5 K/uL   RBC 4.00 (L) 4.22 - 5.81 MIL/uL   Hemoglobin 12.1 (L) 13.0 -  17.0 g/dL   HCT 37.0 (L) 39.0 - 52.0 %   MCV 92.5 80.0 - 100.0 fL   MCH 30.3 26.0 - 34.0 pg   MCHC 32.7 30.0 - 36.0 g/dL   RDW 13.2 11.5 - 15.5 %   Platelets 265 150 - 400 K/uL   nRBC 0.0 0.0 - 0.2 %   Neutrophils Relative % 80 %   Neutro Abs 8.6 (H) 1.7 - 7.7 K/uL   Lymphocytes Relative 13 %   Lymphs Abs 1.4 0.7 - 4.0 K/uL   Monocytes Relative 5 %   Monocytes Absolute 0.5 0.1 - 1.0 K/uL   Eosinophils Relative 1 %   Eosinophils Absolute 0.1 0.0 - 0.5 K/uL   Basophils Relative 1 %   Basophils Absolute 0.1 0.0 - 0.1 K/uL   Immature Granulocytes 0 %   Abs Immature Granulocytes 0.04 0.00 - 0.07 K/uL    Comment: Performed at [facility] [address], [place], [place] [address]  Comprehensive metabolic panel     Status: Abnormal   Collection Time: 02/25/21  9:30 PM  Result Value Ref Range   Sodium 140 135 - 145 mmol/L   Potassium 3.7 3.5 - 5.1 mmol/L   Chloride 108 98 - 111 mmol/L   CO2 26 22 - 32 mmol/L   Glucose, Bld 102 (H) 70 - 99 mg/dL    Comment: Glucose reference range applies only to samples taken after fasting for at least 8 hours.   BUN 23 8 - 23 mg/dL   Creatinine, Ser 1.71 (H) 0.61 - 1.24 mg/dL   Calcium 8.8 (L) 8.9 - 10.3 mg/dL   Total Protein 6.7 6.5 - 8.1 g/dL   Albumin 3.4 (L) 3.5 - 5.0 g/dL   AST 215 (H) 15 - 41 U/L   ALT 85 (H) 0 - 44 U/L   Alkaline Phosphatase 98 38 - 126 U/L   Total  Bilirubin 1.6 (H) 0.3 - 1.2 mg/dL   GFR, Estimated 38 (L) >60 mL/min    Comment: (NOTE) Calculated using the CKD-EPI Creatinine Equation (2021)    Anion gap 6 5 - 15    Comment: Performed at [facility], [place] [address], [place], [place] [address]  Lipase, blood     Status: Abnormal   Collection Time: 02/25/21  9:30 PM  Result Value Ref Range   Lipase 3,570 (H) 11 - 51 U/L    Comment: RESULTS CONFIRMED BY MANUAL DILUTION Performed at [facility], [place] [address], [place], [place] [address]   Lactic acid, plasma     Status: None   Collection Time: 02/25/21 11:40 PM  Result Value Ref Range   Lactic Acid, Venous 1.9 0.5 - 1.9 mmol/L    Comment: Performed at [facility] [address], [place], [place] [address]  Hepatitis panel, acute     Status: Abnormal   Collection Time: 02/25/21 11:40 PM  Result Value Ref Range   Hepatitis B Surface Ag NON REACTIVE NON REACTIVE   HCV Ab Reactive (A) NON REACTIVE    Comment: (NOTE) The CDC recommends that a Reactive HCV antibody result be followed up  with a HCV Nucleic Acid Amplification test.     Hep A IgM NON REACTIVE NON REACTIVE   Hep B C IgM NON REACTIVE NON REACTIVE    Comment: Performed at [facility], [place] [address] [place], [place] [address]  Resp Panel by RT-PCR (Flu A&B, Covid) Nasopharyngeal Swab  Status: None   Collection Time: 02/25/21 11:44 PM   Specimen: Nasopharyngeal Swab; Nasopharyngeal(NP) swabs in vial transport medium  Result Value Ref Range   SARS Coronavirus 2 by RT PCR NEGATIVE NEGATIVE    Comment: (NOTE) SARS-CoV-2 target nucleic acids are NOT DETECTED.  The SARS-CoV-2 RNA is generally detectable in upper respiratory specimens during the acute phase of infection. The lowest concentration of SARS-CoV-2 viral copies this assay can detect is 138 copies/mL. A negative result does not preclude SARS-Cov-2 infection and should not be used as the sole basis for treatment or other patient  management decisions. A negative result may occur with  improper specimen collection/handling, submission of specimen other than nasopharyngeal swab, presence of viral mutation(s) within the areas targeted by this assay, and inadequate number of viral copies(<138 copies/mL). A negative result must be combined with clinical observations, patient history, and epidemiological information. The expected result is Negative.  Fact Sheet for Patients:  EntrepreneurPulse.com.au  Fact Sheet for Healthcare Providers:  IncredibleEmployment.be  This test is no t yet approved or cleared by the [place] FDA and  has been authorized for detection and/or diagnosis of SARS-CoV-2 by FDA under an Emergency Use Authorization (EUA). This EUA will remain  in effect (meaning this test can be used) for the duration of the COVID-19 declaration under Section 564(b)(1) of the Act, 21 U.S.C.section 360bbb-3(b)(1), unless the authorization is terminated  or revoked sooner.       Influenza A by PCR NEGATIVE NEGATIVE   Influenza B by PCR NEGATIVE NEGATIVE    Comment: (NOTE) The Xpert Xpress SARS-CoV-2/FLU/RSV plus assay is intended as an aid in the diagnosis of influenza from Nasopharyngeal swab specimens and should not be used as a sole basis for treatment. Nasal washings and aspirates are unacceptable for Xpert Xpress SARS-CoV-2/FLU/RSV testing.  Fact Sheet for Patients: EntrepreneurPulse.com.au  Fact Sheet for Healthcare Providers: IncredibleEmployment.be  This test is not yet approved or cleared by the [place] FDA and has been authorized for detection and/or diagnosis of SARS-CoV-2 by FDA under an Emergency Use Authorization (EUA). This EUA will remain in effect (meaning this test can be used) for the duration of the COVID-19 declaration under Section 564(b)(1) of the Act, 21 U.S.C. section 360bbb-3(b)(1), unless the  authorization is terminated or revoked.  Performed at [facility], [place] [address], [place], [place] [address]   Comprehensive metabolic panel     Status: Abnormal   Collection Time: 02/26/21  2:48 AM  Result Value Ref Range   Sodium 138 135 - 145 mmol/L   Potassium 3.4 (L) 3.5 - 5.1 mmol/L   Chloride 103 98 - 111 mmol/L   CO2 24 22 - 32 mmol/L   Glucose, Bld 142 (H) 70 - 99 mg/dL    Comment: Glucose reference range applies only to samples taken after fasting for at least 8 hours.   BUN 20 8 - 23 mg/dL   Creatinine, Ser 1.63 (H) 0.61 - 1.24 mg/dL   Calcium 8.7 (L) 8.9 - 10.3 mg/dL   Total Protein 6.0 (L) 6.5 - 8.1 g/dL   Albumin 3.2 (L) 3.5 - 5.0 g/dL   AST 790 (H) 15 - 41 U/L   ALT 355 (H) 0 - 44 U/L   Alkaline Phosphatase 128 (H) 38 - 126 U/L   Total Bilirubin 2.3 (H) 0.3 - 1.2 mg/dL   GFR, Estimated 40 (L) >60 mL/min    Comment: (NOTE) Calculated using the CKD-EPI Creatinine Equation (2021)  Anion gap 11 5 - 15    Comment: Performed at [facility] [address], [place], [place] [address]  CBC     Status: Abnormal   Collection Time: 02/26/21  2:48 AM  Result Value Ref Range   WBC 7.4 4.0 - 10.5 K/uL   RBC 3.83 (L) 4.22 - 5.81 MIL/uL   Hemoglobin 11.7 (L) 13.0 - 17.0 g/dL   HCT 35.0 (L) 39.0 - 52.0 %   MCV 91.4 80.0 - 100.0 fL   MCH 30.5 26.0 - 34.0 pg   MCHC 33.4 30.0 - 36.0 g/dL   RDW 13.3 11.5 - 15.5 %   Platelets 226 150 - 400 K/uL   nRBC 0.0 0.0 - 0.2 %    Comment: Performed at [facility], [place] [address] [place], [place] [address]   CT ABDOMEN PELVIS WO CONTRAST  Result Date: 02/25/2021 CLINICAL DATA:  Left lower quadrant abdominal pain. EXAM: CT ABDOMEN AND PELVIS WITHOUT CONTRAST TECHNIQUE: Multidetector CT imaging of the abdomen and pelvis was performed following the standard protocol without IV contrast. COMPARISON:  05/19/2006 FINDINGS: Lower chest: Emphysematous changes and mild interstitial fibrosis in the lung bases. Small  esophageal hiatal hernia. Hepatobiliary: No focal liver lesions. Gallbladder is distended with evidence of gallbladder wall thickening and edema. No stones identified. Changes could indicate acute cholecystitis. No bile duct dilatation. Pancreas: Unremarkable. No pancreatic ductal dilatation or surrounding inflammatory changes. Spleen: Normal in size without focal abnormality. Adrenals/Urinary Tract: Adrenal glands are unremarkable. Kidneys are normal, without renal calculi, focal lesion, or hydronephrosis. Bladder is unremarkable. Stomach/Bowel: Stomach, small bowel, and colon are not abnormally distended. Scattered stool throughout the colon. No wall thickening or inflammatory changes appreciated. Appendix is normal. There is a moderate-sized left inguinal hernia containing fat and sigmoid colon. No proximal colonic obstruction. Vascular/Lymphatic: Aortic atherosclerosis. No enlarged abdominal or pelvic lymph nodes. Reproductive: Prostate gland is mildly enlarged. Other: No free air or free fluid in the abdomen. Musculoskeletal: Prominent degenerative changes in the spine. No destructive bone lesions. IMPRESSION: 1. Distended gallbladder with gallbladder wall thickening and edema. No stones identified. Changes suggest acute cholecystitis. 2. Moderate-sized left inguinal hernia containing fat and sigmoid colon. No proximal colonic obstruction. 3. Emphysematous changes and mild interstitial fibrosis in the lung bases. 4. Small esophageal hiatal hernia. 5. Aortic atherosclerosis. 6. Mildly enlarged prostate gland. Aortic Atherosclerosis (ICD10-I70.0) and Emphysema (ICD10-J43.9). Electronically Signed   By: Lucienne Capers M.D.   On: 02/25/2021 23:32   MR ABDOMEN MRCP WO CONTRAST  Result Date: 02/26/2021 CLINICAL DATA:  Pancreatitis and suspected right upper quadrant abdominal pain. Left lower quadrant abdominal pain. EXAM: MRI ABDOMEN WITHOUT CONTRAST  (INCLUDING MRCP) TECHNIQUE: Multiplanar multisequence MR  imaging of the abdomen was performed. Heavily T2-weighted images of the biliary and pancreatic ducts were obtained, and three-dimensional MRCP images were rendered by post processing. COMPARISON:  Multiple exams, including CT 02/25/2021 FINDINGS: Despite efforts by the technologist and patient, motion artifact is present on today's exam and could not be eliminated. This reduces exam sensitivity and specificity. Lower chest: Mild cardiomegaly. Hepatobiliary: Mild periportal edema. Substantial gallbladder wall thickening about 0.6 cm. Tiny dependent filling defects are present in the common bile duct for example on image 23 of series 8 and image 24 series 4. Appearance compatible with either debris or tiny stones. Similarly there is some subtle dependent multifocal filling defect along the dependent wall of the gallbladder on image 20 series 4 again compatible with debris or tiny gallstones.  The common bile duct measures up to 0.9 cm in diameter, borderline prominent for the patient's age. Slightly abrupt truncation of the distal CBD on image 22 series 5. Pancreas: Borderline prominence of the dorsal pancreatic duct in the pancreatic head and body. Spleen:  Unremarkable Adrenals/Urinary Tract:  Unremarkable Stomach/Bowel: Small type 1 hiatal hernia. Vascular/Lymphatic: Aortoiliac atherosclerotic vascular disease. Borderline enlarged right gastric, celiac, porta hepatis, and peripancreatic lymph nodes. An index right gastric node measures 0.8 cm in short axis on image 15 series 8. Portacaval node 1.2 cm in short axis, image 20 series 8. Other: Mild perihepatic ascites. Low-level T2 hyperintense stranding/edema along the porta hepatis and descending duodenum. Musculoskeletal: The lumbar spondylosis and degenerative disc disease with mild levoconvex lumbar scoliosis. Postoperative findings in the lower lumbar spine. IMPRESSION: 1. Tiny stones or less likely debris in the thick-walled gallbladder and to a lesser extent in  the borderline dilated common bile duct. There is some mild abrupt truncation of the distal CBD which could possibly be related to choledocholithiasis. Perihepatic ascites. If the clinical picture of whether the patient has cholecystitis is still uncertain, nuclear medicine hepatobiliary scan can assess patency of the cystic duct. 2. Mild nonspecific periportal edema. 3. Upper normal/borderline prominent right gastric, porta hepatis, and peripancreatic lymph nodes. Likely reactive. 4.  Aortic Atherosclerosis (ICD10-I70.0). 5. Lumbar spondylosis and degenerative disc disease along with postoperative findings in the lumbar spine. 6. Mild cardiomegaly. 7. Hiatal hernia. Electronically Signed   By: Van Clines M.D.   On: 02/26/2021 08:14   MR 3D Recon At Scanner  Result Date: 02/26/2021 CLINICAL DATA:  Pancreatitis and suspected right upper quadrant abdominal pain. Left lower quadrant abdominal pain. EXAM: MRI ABDOMEN WITHOUT CONTRAST  (INCLUDING MRCP) TECHNIQUE: Multiplanar multisequence MR imaging of the abdomen was performed. Heavily T2-weighted images of the biliary and pancreatic ducts were obtained, and three-dimensional MRCP images were rendered by post processing. COMPARISON:  Multiple exams, including CT 02/25/2021 FINDINGS: Despite efforts by the technologist and patient, motion artifact is present on today's exam and could not be eliminated. This reduces exam sensitivity and specificity. Lower chest: Mild cardiomegaly. Hepatobiliary: Mild periportal edema. Substantial gallbladder wall thickening about 0.6 cm. Tiny dependent filling defects are present in the common bile duct for example on image 23 of series 8 and image 24 series 4. Appearance compatible with either debris or tiny stones. Similarly there is some subtle dependent multifocal filling defect along the dependent wall of the gallbladder on image 20 series 4 again compatible with debris or tiny gallstones. The common bile duct measures up  to 0.9 cm in diameter, borderline prominent for the patient's age. Slightly abrupt truncation of the distal CBD on image 22 series 5. Pancreas: Borderline prominence of the dorsal pancreatic duct in the pancreatic head and body. Spleen:  Unremarkable Adrenals/Urinary Tract:  Unremarkable Stomach/Bowel: Small type 1 hiatal hernia. Vascular/Lymphatic: Aortoiliac atherosclerotic vascular disease. Borderline enlarged right gastric, celiac, porta hepatis, and peripancreatic lymph nodes. An index right gastric node measures 0.8 cm in short axis on image 15 series 8. Portacaval node 1.2 cm in short axis, image 20 series 8. Other: Mild perihepatic ascites. Low-level T2 hyperintense stranding/edema along the porta hepatis and descending duodenum. Musculoskeletal: The lumbar spondylosis and degenerative disc disease with mild levoconvex lumbar scoliosis. Postoperative findings in the lower lumbar spine. IMPRESSION: 1. Tiny stones or less likely debris in the thick-walled gallbladder and to a lesser extent in the borderline dilated common bile duct. There is some mild abrupt truncation of  the distal CBD which could possibly be related to choledocholithiasis. Perihepatic ascites. If the clinical picture of whether the patient has cholecystitis is still uncertain, nuclear medicine hepatobiliary scan can assess patency of the cystic duct. 2. Mild nonspecific periportal edema. 3. Upper normal/borderline prominent right gastric, porta hepatis, and peripancreatic lymph nodes. Likely reactive. 4.  Aortic Atherosclerosis (ICD10-I70.0). 5. Lumbar spondylosis and degenerative disc disease along with postoperative findings in the lumbar spine. 6. Mild cardiomegaly. 7. Hiatal hernia. Electronically Signed   By: Van Clines M.D.   On: 02/26/2021 08:14   US Abdomen Limited RUQ (LIVER/GB)  Result Date: 02/26/2021 CLINICAL DATA:  Abdominal pain EXAM: ULTRASOUND ABDOMEN LIMITED RIGHT UPPER QUADRANT COMPARISON:  CT 02/25/2021  FINDINGS: Gallbladder: Diffuse gallbladder wall thickening, with gallbladder wall thickness measuring 9.3 mm. No gallstones are demonstrated. Murphy's sign is negative. Gallbladder wall thickening is nonspecific and could be due to cholecystitis, liver disease, or adenomyosis. Common bile duct: Diameter: 6.3 mm, upper limits of normal Liver: No focal lesion identified. Within normal limits in parenchymal echogenicity. Portal vein is patent on color Doppler imaging with normal direction of blood flow towards the liver. Other: None. IMPRESSION: Diffuse gallbladder wall thickening, nonspecific. No cholelithiasis. Negative Murphy's sign. Electronically Signed   By: Lucienne Capers M.D.   On: 02/26/2021 00:20      Assessment/Plan Hx of CVA/TIA - on plavix, LD 4/26 AM Hx of gastric ulcers HTN HLD CKD stage III Dementia Hiatal hernia  Left inguinal hernia - not incarcerated, can follow up outpatient to discuss elective repair    Acute biliary pancreatitis  - CT 4/26 with distended gallbladder with some gallbladder wall thickening, RUQ [place] 4/26 also appeared to be without stones, MRCP this AM did see very small stones/debris - possible choledocholithiasis noted on MRCP - lipase on admission was 3570, AST/ALT were 215/85, Tbili was 1.6 - today AST/ALT are trending up 790/355 and Tbili 2.3 - GI following as well and at present are NOT planning ERCP - will continue to follow LFTs in consideration for possible laparoscopic cholecystectomy with IOC - possible OR Friday pending labs and medical stability   FEN: NPO, IVF @100  cc/h VTE: plavix on hold, SCDs ID: [id number] 4/27; Zosyn 4/27>> NO FURTHER ABX NEEDED FROM A SURGICAL STANDPOINT   Norm Parcel, [facility] 02/26/2021, 2:49 PM Please see Amion for pager number during day hours 7:00am-4:30pm

## 2021-02-26 NOTE — H&P (Signed)
History and Physical    Leroy [name] DDU:[id number] DOB: February 02, 1930 DOA: 02/25/2021  PCP: Idelle Crouch, MD   Patient coming from: Home   Chief Complaint: Abdominal pain   HPI: Leroy [name] is a 87 y.o. male with medical history significant for chronic kidney disease stage IIIb, hypertension, and dementia, now presenting to the emergency department for evaluation of cute onset of severe abdominal pain.  Patient is accompanied by his daughter who assists with the history.  He reportedly ate lunch at approximately 12:30 PM and remained in his usual state until approximately 5 PM when he complained of severe abdominal pain and asked his family to call EMS.  There was no vomiting but the patient felt like he wanted to.  Family does not remember him complaining of abdominal pain before this.  ED Course: Upon arrival to the ED, patient is found to be afebrile, saturating well on room air, slightly tachycardic, and with blood pressure 170/100.  Chemistry panel notable for creatinine 1.71, AST 215, ALT 85, and bilirubin 1.6.  CBC features a WBC of 10,600 and slight normocytic anemia.  Lactic acid is normal.  Lipase elevated to 3570.  CT demonstrates distended gallbladder with wall thickening and edema but no stone identified.  Right upper quadrant ultrasound features diffuse gallbladder wall thickening with CBD diameter upper limit of normal.  Blood cultures were collected and the patient was given a liter of LR, cefepime, and Flagyl.  Review of Systems:  ROS limited by patient's clinical condition.  Past Medical History:  Diagnosis Date  . Anemia   . Bursitis   . Cancer ([place])    skin cancer face  . Chronic kidney disease   . Dementia ([place])   . Dyspnea   . GI bleed   . History of hiatal hernia   . Hyperlipidemia   . Hypertension   . IVCD (intraventricular conduction defect)   . Leg weakness, bilateral   . Obesity   . Peripheral vascular disease ([place])   . Stomach ulcer   . Stroke  [facility])    feb 2017, weakness in legs,   . Tremor   . Vitamin B 12 deficiency   . Vitamin D deficiency     Past Surgical History:  Procedure Laterality Date  . BACK SURGERY    . ESOPHAGOGASTRODUODENOSCOPY (EGD) WITH PROPOFOL N/A 11/09/2018   Procedure: ESOPHAGOGASTRODUODENOSCOPY (EGD) WITH PROPOFOL;  Surgeon: Manya Silvas, MD;  Location: [facility] ENDOSCOPY;  Service: Endoscopy;  Laterality: N/A;  . EYE SURGERY      bilateral cataracts  . HERNIA REPAIR     inguinal on left  . INGUINAL HERNIA REPAIR Right 12/29/2017   Procedure: HERNIA REPAIR INGUINAL ADULT;  Surgeon: Herbert Pun, MD;  Location: [facility] ORS;  Service: General;  Laterality: Right;  . INSERTION OF MESH Right 12/29/2017   Procedure: INSERTION OF MESH;  Surgeon: Herbert Pun, MD;  Location: [facility] ORS;  Service: General;  Laterality: Right;  . LUMBAR LAMINECTOMY/DECOMPRESSION MICRODISCECTOMY N/A 08/12/2016   Procedure: LAMINECTOMY AND FORAMINOTOMY LUMBAR FOUR - LUMBAR FIVE;  Surgeon: Newman Pies, MD;  Location: [facility];  Service: Neurosurgery;  Laterality: N/A;  LAMINECTOMY AND FORAMINOTOMY L4-L5    Social History:   reports that he has never smoked. His smokeless tobacco use includes chew. He reports that he does not drink alcohol and does not use drugs.  Allergies  Allergen Reactions  . Aspirin Other (See Comments)    He has bleeding ulcers    Family History  Problem Relation Age of Onset  . CAD Mother   . Cancer Father      Prior to Admission medications   Medication Sig Start Date End Date Taking? Authorizing Provider  acetaminophen (TYLENOL) 500 MG tablet Take 500 mg by mouth daily as needed for moderate pain or headache.    [provider]  cholecalciferol (VITAMIN D) 1000 units tablet Take 2,000 Units by mouth daily.     [provider]  clopidogrel (PLAVIX) 75 MG tablet Take 75 mg by mouth daily. 05/13/20   [provider]  cyanocobalamin (,VITAMIN B-12,) 1000 MCG/ML  injection Inject 1,000 mcg into the muscle every 30 (thirty) days.    [provider]  ezetimibe (ZETIA) 10 MG tablet Take 10 mg by mouth at bedtime. 11/05/20   [provider]  isosorbide mononitrate (IMDUR) 30 MG 24 hr tablet Take 30 mg by mouth daily. 12/09/20   [provider]  levothyroxine (SYNTHROID) 50 MCG tablet Take 50 mcg by mouth daily. 11/08/20   [provider]  metoprolol succinate (TOPROL-XL) 25 MG 24 hr tablet Take 12.5 mg by mouth in the morning.  02/27/20 02/26/21  [provider]  pantoprazole (PROTONIX) 40 MG tablet Take 1 tablet (40 mg total) by mouth daily. 03/11/20 06/21/20  Tawni Millers, MD    Physical Exam: Vitals:   02/25/21 2343 02/26/21 0214 02/26/21 0230 02/26/21 0300  BP: (!) 170/98 (!) 154/91 (!) 163/88 (!) 178/95  Pulse: (!) 103 100 100 (!) 102  Resp: (!) 24 (!) 24 (!) 24 (!) 24  Temp:      TempSrc:      SpO2: 96% 95% 96% 98%  Weight:      Height:        Constitutional: NAD, calm  Eyes: PERTLA, lids and conjunctivae normal ENMT: Mucous membranes are moist. Posterior pharynx clear of any exudate or lesions.   Neck: supple, no masses  Respiratory:  no wheezing, no crackles. No accessory muscle use.  Cardiovascular: S1 & S2 heard, regular rate and rhythm. No extremity edema.  Abdomen: Tender in RUQ, no rebound pain or guarding. Bowel sounds active.  Musculoskeletal: no clubbing / cyanosis. No joint deformity upper and lower extremities.   Skin: no significant rashes, lesions, ulcers. Warm, dry, well-perfused. Neurologic: CN 2-12 grossly intact. Sensation intact. Moving all extremities.  Psychiatric: Alert and oriented to person only. Very pleasant and cooperative.    Labs and Imaging on Admission: I have personally reviewed following labs and imaging studies  CBC: Recent Labs  Lab 02/25/21 2130  WBC 10.6*  NEUTROABS 8.6*  HGB 12.1*  HCT 37.0*  MCV 92.5  PLT [id number]   Basic Metabolic Panel: Recent  Labs  Lab 02/25/21 2130  NA 140  K 3.7  CL 108  CO2 26  GLUCOSE 102*  BUN 23  CREATININE 1.71*  CALCIUM 8.8*   GFR: Estimated Creatinine Clearance: 37.3 mL/min (A) (by C-G formula based on SCr of 1.71 mg/dL (H)). Liver Function Tests: Recent Labs  Lab 02/25/21 2130  AST 215*  ALT 85*  ALKPHOS 98  BILITOT 1.6*  PROT 6.7  ALBUMIN 3.4*   Recent Labs  Lab 02/25/21 2130  LIPASE 3,570*   No results for input(s): AMMONIA in the last 168 hours. Coagulation Profile: No results for input(s): INR, PROTIME in the last 168 hours. Cardiac Enzymes: No results for input(s): CKTOTAL, CKMB, CKMBINDEX, TROPONINI in the last 168 hours. BNP (last 3 results) No results for input(s): PROBNP in  the last 8760 hours. HbA1C: No results for input(s): HGBA1C in the last 72 hours. CBG: No results for input(s): GLUCAP in the last 168 hours. Lipid Profile: No results for input(s): CHOL, HDL, LDLCALC, TRIG, CHOLHDL, LDLDIRECT in the last 72 hours. Thyroid Function Tests: No results for input(s): TSH, T4TOTAL, FREET4, T3FREE, THYROIDAB in the last 72 hours. Anemia Panel: No results for input(s): VITAMINB12, FOLATE, FERRITIN, TIBC, IRON, RETICCTPCT in the last 72 hours. Urine analysis:    Component Value Date/Time   COLORURINE YELLOW 02/25/2021 2045   APPEARANCEUR CLEAR 02/25/2021 2045   LABSPEC 1.012 02/25/2021 2045   PHURINE 6.0 02/25/2021 2045   GLUCOSEU NEGATIVE 02/25/2021 2045   HGBUR MODERATE (A) 02/25/2021 2045   BILIRUBINUR NEGATIVE 02/25/2021 2045   [place] 02/25/2021 2045   PROTEINUR 30 (A) 02/25/2021 2045   NITRITE NEGATIVE 02/25/2021 2045   LEUKOCYTESUR NEGATIVE 02/25/2021 2045   Sepsis Labs: @LABRCNTIP (procalcitonin:4,lacticidven:4) ) Recent Results (from the past 240 hour(s))  Resp Panel by RT-PCR (Flu A&B, Covid) Nasopharyngeal Swab     Status: None   Collection Time: 02/25/21 11:44 PM   Specimen: Nasopharyngeal Swab; Nasopharyngeal(NP) swabs in vial  transport medium  Result Value Ref Range Status   SARS Coronavirus 2 by RT PCR NEGATIVE NEGATIVE Final    Comment: (NOTE) SARS-CoV-2 target nucleic acids are NOT DETECTED.  The SARS-CoV-2 RNA is generally detectable in upper respiratory specimens during the acute phase of infection. The lowest concentration of SARS-CoV-2 viral copies this assay can detect is 138 copies/mL. A negative result does not preclude SARS-Cov-2 infection and should not be used as the sole basis for treatment or other patient management decisions. A negative result may occur with  improper specimen collection/handling, submission of specimen other than nasopharyngeal swab, presence of viral mutation(s) within the areas targeted by this assay, and inadequate number of viral copies(<138 copies/mL). A negative result must be combined with clinical observations, patient history, and epidemiological information. The expected result is Negative.  Fact Sheet for Patients:  EntrepreneurPulse.com.au  Fact Sheet for Healthcare Providers:  IncredibleEmployment.be  This test is no t yet approved or cleared by the [place] FDA and  has been authorized for detection and/or diagnosis of SARS-CoV-2 by FDA under an Emergency Use Authorization (EUA). This EUA will remain  in effect (meaning this test can be used) for the duration of the COVID-19 declaration under Section 564(b)(1) of the Act, 21 U.S.C.section 360bbb-3(b)(1), unless the authorization is terminated  or revoked sooner.       Influenza A by PCR NEGATIVE NEGATIVE Final   Influenza B by PCR NEGATIVE NEGATIVE Final    Comment: (NOTE) The Xpert Xpress SARS-CoV-2/FLU/RSV plus assay is intended as an aid in the diagnosis of influenza from Nasopharyngeal swab specimens and should not be used as a sole basis for treatment. Nasal washings and aspirates are unacceptable for Xpert Xpress SARS-CoV-2/FLU/RSV testing.  Fact  Sheet for Patients: EntrepreneurPulse.com.au  Fact Sheet for Healthcare Providers: IncredibleEmployment.be  This test is not yet approved or cleared by the [place] FDA and has been authorized for detection and/or diagnosis of SARS-CoV-2 by FDA under an Emergency Use Authorization (EUA). This EUA will remain in effect (meaning this test can be used) for the duration of the COVID-19 declaration under Section 564(b)(1) of the Act, 21 U.S.C. section 360bbb-3(b)(1), unless the authorization is terminated or revoked.  Performed at [facility], [place] [address], [place], [place] [address]      Radiological Exams on Admission:  CT ABDOMEN PELVIS WO CONTRAST  Result Date: 02/25/2021 CLINICAL DATA:  Left lower quadrant abdominal pain. EXAM: CT ABDOMEN AND PELVIS WITHOUT CONTRAST TECHNIQUE: Multidetector CT imaging of the abdomen and pelvis was performed following the standard protocol without IV contrast. COMPARISON:  05/19/2006 FINDINGS: Lower chest: Emphysematous changes and mild interstitial fibrosis in the lung bases. Small esophageal hiatal hernia. Hepatobiliary: No focal liver lesions. Gallbladder is distended with evidence of gallbladder wall thickening and edema. No stones identified. Changes could indicate acute cholecystitis. No bile duct dilatation. Pancreas: Unremarkable. No pancreatic ductal dilatation or surrounding inflammatory changes. Spleen: Normal in size without focal abnormality. Adrenals/Urinary Tract: Adrenal glands are unremarkable. Kidneys are normal, without renal calculi, focal lesion, or hydronephrosis. Bladder is unremarkable. Stomach/Bowel: Stomach, small bowel, and colon are not abnormally distended. Scattered stool throughout the colon. No wall thickening or inflammatory changes appreciated. Appendix is normal. There is a moderate-sized left inguinal hernia containing fat and sigmoid colon. No proximal colonic obstruction.  Vascular/Lymphatic: Aortic atherosclerosis. No enlarged abdominal or pelvic lymph nodes. Reproductive: Prostate gland is mildly enlarged. Other: No free air or free fluid in the abdomen. Musculoskeletal: Prominent degenerative changes in the spine. No destructive bone lesions. IMPRESSION: 1. Distended gallbladder with gallbladder wall thickening and edema. No stones identified. Changes suggest acute cholecystitis. 2. Moderate-sized left inguinal hernia containing fat and sigmoid colon. No proximal colonic obstruction. 3. Emphysematous changes and mild interstitial fibrosis in the lung bases. 4. Small esophageal hiatal hernia. 5. Aortic atherosclerosis. 6. Mildly enlarged prostate gland. Aortic Atherosclerosis (ICD10-I70.0) and Emphysema (ICD10-J43.9). Electronically Signed   By: Lucienne Capers M.D.   On: 02/25/2021 23:32   US Abdomen Limited RUQ (LIVER/GB)  Result Date: 02/26/2021 CLINICAL DATA:  Abdominal pain EXAM: ULTRASOUND ABDOMEN LIMITED RIGHT UPPER QUADRANT COMPARISON:  CT 02/25/2021 FINDINGS: Gallbladder: Diffuse gallbladder wall thickening, with gallbladder wall thickness measuring 9.3 mm. No gallstones are demonstrated. Murphy's sign is negative. Gallbladder wall thickening is nonspecific and could be due to cholecystitis, liver disease, or adenomyosis. Common bile duct: Diameter: 6.3 mm, upper limits of normal Liver: No focal lesion identified. Within normal limits in parenchymal echogenicity. Portal vein is patent on color Doppler imaging with normal direction of blood flow towards the liver. Other: None. IMPRESSION: Diffuse gallbladder wall thickening, nonspecific. No cholelithiasis. Negative Murphy's sign. Electronically Signed   By: Lucienne Capers M.D.   On: 02/26/2021 00:20    Assessment/Plan   1. Acute pancreatitis; cholecystitis  - Presents with acute-onset severe abdominal pain and nausea and found to have lipase 3570 with elevated transaminases; ?passed stone or sludge  - No stones  or biliary dilation seen on CT or [place] but he is tender in RUQ and imaging suggestive of acute cholecystitis  - He was cultured and started on antibiotics in ED  - Check MRCP, trend LFTs, continue antibiotics, follow cultures and clinical course    2. CKD IIIb  - SCr 1.71 on admission; baseline appears to be closer to 1.5   - IVF hydration while NPO, renally-dose medications, monitor    3. Hypertension  - Treat as-needed only for now    4. Dementia  - Delirium precautions     DVT prophylaxis: SCDs  Code Status: Full  Level of Care: Level of care: Med-Surg Family Communication: Daughter updated at bedside Disposition Plan:  Patient is from: Home  Anticipated d/c is to: TBD Anticipated d/c date is: 03/01/21  Patient currently: Pending MRCP, cultures, repeat labs   Consults called: None  Admission status: Inpatient  Vianne Bulls, MD [facility]  02/26/2021, 3:15 AM

## 2021-02-26 NOTE — ED Provider Notes (Incomplete)
[facility] EMERGENCY DEPARTMENT Provider Note   CSN: [id number] Arrival date & time: 02/25/21  2035     History Chief Complaint  Patient presents with  . Abdominal Pain    Leroy [name] is a 87 y.o. male.   Abdominal Pain Pain location:  LLQ Pain quality: aching   Pain radiates to:  Does not radiate Pain severity:  Moderate Onset quality:  Gradual Timing:  Constant Progression:  Resolved Chronicity:  New Context: eating   Relieved by:  Nothing Worsened by:  Nothing Ineffective treatments:  None tried Associated symptoms: constipation   Associated symptoms: no anorexia, no chest pain, no chills, no cough, no diarrhea, no dysuria, no fever, no nausea, no shortness of breath and no vomiting        Past Medical History:  Diagnosis Date  . Anemia   . Bursitis   . Cancer ([place])    skin cancer face  . Chronic kidney disease   . Dementia ([place])   . Dyspnea   . GI bleed   . History of hiatal hernia   . Hyperlipidemia   . Hypertension   . IVCD (intraventricular conduction defect)   . Leg weakness, bilateral   . Obesity   . Peripheral vascular disease ([place])   . Stomach ulcer   . Stroke [facility])    feb 2017, weakness in legs,   . Tremor   . Vitamin B 12 deficiency   . Vitamin D deficiency     Patient Active Problem List   Diagnosis Date Noted  . PAD (peripheral artery disease) ([place]) 06/21/2020  . Degenerative disc disease, lumbar 04/19/2020  . Hiatal hernia 04/19/2020  . Obesity 04/19/2020  . Upper GI bleed 04/19/2020  . Abdominal pain 04/19/2020  . Pain in limb 04/19/2020  . Carotid stenosis 04/19/2020  . TIA (transient ischemic attack) 03/10/2020  . Hyperlipidemia 03/10/2020  . HTN (hypertension) 03/10/2020  . Schatzki's ring 12/02/2018  . Constipation due to slow transit 10/06/2018  . Stool incontinence 10/06/2018  . 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 Vitamin D deficiency  06/22/2018  . Carpal tunnel syndrome, right 07/13/2017  . Diarrhea 04/20/2017  . Bursitis of shoulder, right 02/22/2017  . Rotator cuff tendinitis, right 02/22/2017  . B12 deficiency 10/12/2016  . CKD (chronic kidney disease) stage 3, GFR 30-59 ml/min (HCC) 10/12/2016  . Lumbar stenosis with neurogenic claudication 08/12/2016  . IVCD (intraventricular conduction defect) 07/13/2016  . Preop cardiovascular exam 07/13/2016  . SOB (shortness of breath) on exertion 07/13/2016  . Polyneuropathy 05/25/2016  . Benign essential tremor 04/08/2016  . Leg weakness, bilateral 04/08/2016  . Hypertensive emergency 12/13/2015  . 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 Acute intracerebral hemorrhage ([place]) 12/12/2015  . Anemia 06/25/2014    Past Surgical History:  Procedure Laterality Date  . BACK SURGERY    . ESOPHAGOGASTRODUODENOSCOPY (EGD) WITH PROPOFOL N/A 11/09/2018   Procedure: ESOPHAGOGASTRODUODENOSCOPY (EGD) WITH PROPOFOL;  Surgeon: Manya Silvas, MD;  Location[facility] ENDOSCOPY;  Service: Endoscopy;  Laterality: N/A;  . EYE SURGERY      bilateral cataracts  . HERNIA REPAIR     inguinal on left  . INGUINAL HERNIA REPAIR Right 12/29/2017   Procedure: HERNIA REPAIR INGUINAL ADULT;  Surgeon: Herbert Pun, MD;  Location: [facility] ORS;  Service: General;  Laterality: Right;  . INSERTION OF MESH Right 12/29/2017   Procedure: INSERTION OF MESH;  Surgeon: Herbert Pun, MD;  Location: [facility] ORS;  Service:  General;  Laterality: Right;  . LUMBAR LAMINECTOMY/DECOMPRESSION MICRODISCECTOMY N/A 08/12/2016   Procedure: LAMINECTOMY AND FORAMINOTOMY LUMBAR FOUR - LUMBAR FIVE;  Surgeon: Newman Pies, MD;  Location: [place];  Service: Neurosurgery;  Laterality: N/A;  LAMINECTOMY AND FORAMINOTOMY L4-L5       Family History  Problem Relation Age of Onset  . CAD Mother   . Cancer Father     Social History   Tobacco Use  . Smoking status: Never Smoker  . Smokeless  tobacco: Current User    Types: Chew  Vaping Use  . Vaping Use: Never used  Substance Use Topics  . Alcohol use: No  . Drug use: No    Home Medications Prior to Admission medications   Medication Sig Start Date End Date Taking? Authorizing Provider  acetaminophen (TYLENOL) 500 MG tablet Take 500 mg by mouth daily as needed for moderate pain or headache.    [provider]  cholecalciferol (VITAMIN D) 1000 units tablet Take 2,000 Units by mouth daily.     [provider]  clopidogrel (PLAVIX) 75 MG tablet Take 75 mg by mouth daily. 05/13/20   [provider]  cyanocobalamin (,VITAMIN B-12,) 1000 MCG/ML injection Inject 1,000 mcg into the muscle every 30 (thirty) days.    [provider]  ezetimibe (ZETIA) 10 MG tablet Take 10 mg by mouth at bedtime. 11/05/20   [provider]  isosorbide mononitrate (IMDUR) 30 MG 24 hr tablet Take 30 mg by mouth daily. 12/09/20   [provider]  levothyroxine (SYNTHROID) 50 MCG tablet Take 50 mcg by mouth daily. 11/08/20   [provider]  metoprolol succinate (TOPROL-XL) 25 MG 24 hr tablet Take 12.5 mg by mouth in the morning.  02/27/20 02/26/21  [provider]  pantoprazole (PROTONIX) 40 MG tablet Take 1 tablet (40 mg total) by mouth daily. 03/11/20 06/21/20  Arrien, Jimmy Picket, MD    Allergies    Aspirin  Review of Systems   Review of Systems  Constitutional: Negative for chills and fever.  HENT: Negative for congestion and rhinorrhea.   Respiratory: Negative for cough and shortness of breath.   Cardiovascular: Negative for chest pain and palpitations.  Gastrointestinal: Positive for abdominal pain and constipation. Negative for anorexia, diarrhea, nausea and vomiting.  Genitourinary: Negative for difficulty urinating and dysuria.  Musculoskeletal: Negative for arthralgias and back pain.  Skin: Negative for color change and rash.  Neurological: Negative for light-headedness and  headaches.    Physical Exam Updated Vital Signs BP (!) 170/98   Pulse (!) 103   Temp 98.2 F (36.8 C) (Oral)   Resp (!) 24   Ht 6\' 1"  (1.854 m)   Wt 100.7 kg   SpO2 96%   BMI 29.29 kg/m   Physical Exam Vitals and nursing note reviewed.  Constitutional:      General: He is not in acute distress.    Appearance: Normal appearance.  HENT:     Head: Normocephalic and atraumatic.     Nose: No rhinorrhea.  Eyes:     General:        Right eye: No discharge.        Left eye: No discharge.     Conjunctiva/sclera: Conjunctivae normal.  Cardiovascular:     Rate and Rhythm: Normal rate and regular rhythm.  Pulmonary:     Effort: Pulmonary effort is normal.     Breath sounds: No stridor.  Abdominal:     General: Abdomen is flat. There is  distension.     Palpations: Abdomen is soft.     Tenderness: There is generalized abdominal tenderness. There is no guarding or rebound.  Musculoskeletal:        General: No deformity or signs of injury.  Skin:    General: Skin is warm and dry.  Neurological:     General: No focal deficit present.     Mental Status: He is alert. Mental status is at baseline.     Motor: No weakness.  Psychiatric:        Mood and Affect: Mood normal.        Behavior: Behavior normal.        Thought Content: Thought content normal.     ED Results / Procedures / Treatments   Labs (all labs ordered are listed, but only abnormal results are displayed) Labs Reviewed  CBC WITH DIFFERENTIAL/PLATELET - Abnormal; Notable for the following components:      Result Value   WBC 10.6 (*)    RBC 4.00 (*)    Hemoglobin 12.1 (*)    HCT 37.0 (*)    Neutro Abs 8.6 (*)    All other components within normal limits  COMPREHENSIVE METABOLIC PANEL - Abnormal; Notable for the following components:   Glucose, Bld 102 (*)    Creatinine, Ser 1.71 (*)    Calcium 8.8 (*)    Albumin 3.4 (*)    AST 215 (*)    ALT 85 (*)    Total Bilirubin 1.6 (*)    GFR, Estimated 38 (*)     All other components within normal limits  LIPASE, BLOOD - Abnormal; Notable for the following components:   Lipase 3,570 (*)    All other components within normal limits  URINALYSIS, ROUTINE W REFLEX MICROSCOPIC - Abnormal; Notable for the following components:   Hgb urine dipstick MODERATE (*)    Protein, ur 30 (*)    All other components within normal limits  RESP PANEL BY RT-PCR (FLU A&B, COVID) ARPGX2  CULTURE, BLOOD (ROUTINE X 2)  CULTURE, BLOOD (ROUTINE X 2)  LACTIC ACID, PLASMA  LACTIC ACID, PLASMA  HEPATITIS PANEL, ACUTE    EKG None  Radiology CT ABDOMEN PELVIS WO CONTRAST  Result Date: 02/25/2021 CLINICAL DATA:  Left lower quadrant abdominal pain. EXAM: CT ABDOMEN AND PELVIS WITHOUT CONTRAST TECHNIQUE: Multidetector CT imaging of the abdomen and pelvis was performed following the standard protocol without IV contrast. COMPARISON:  05/19/2006 FINDINGS: Lower chest: Emphysematous changes and mild interstitial fibrosis in the lung bases. Small esophageal hiatal hernia. Hepatobiliary: No focal liver lesions. Gallbladder is distended with evidence of gallbladder wall thickening and edema. No stones identified. Changes could indicate acute cholecystitis. No bile duct dilatation. Pancreas: Unremarkable. No pancreatic ductal dilatation or surrounding inflammatory changes. Spleen: Normal in size without focal abnormality. Adrenals/Urinary Tract: Adrenal glands are unremarkable. Kidneys are normal, without renal calculi, focal lesion, or hydronephrosis. Bladder is unremarkable. Stomach/Bowel: Stomach, small bowel, and colon are not abnormally distended. Scattered stool throughout the colon. No wall thickening or inflammatory changes appreciated. Appendix is normal. There is a moderate-sized left inguinal hernia containing fat and sigmoid colon. No proximal colonic obstruction. Vascular/Lymphatic: Aortic atherosclerosis. No enlarged abdominal or pelvic lymph nodes. Reproductive: Prostate gland  is mildly enlarged. Other: No free air or free fluid in the abdomen. Musculoskeletal: Prominent degenerative changes in the spine. No destructive bone lesions. IMPRESSION: 1. Distended gallbladder with gallbladder wall thickening and edema. No stones identified. Changes suggest acute cholecystitis. 2. Moderate-sized left  inguinal hernia containing fat and sigmoid colon. No proximal colonic obstruction. 3. Emphysematous changes and mild interstitial fibrosis in the lung bases. 4. Small esophageal hiatal hernia. 5. Aortic atherosclerosis. 6. Mildly enlarged prostate gland. Aortic Atherosclerosis (ICD10-I70.0) and Emphysema (ICD10-J43.9). Electronically Signed   By: Lucienne Capers M.D.   On: 02/25/2021 23:32    Procedures Procedures {Remember to document critical care time when appropriate:1}  Medications Ordered in ED Medications  lactated ringers infusion (has no administration in time range)  ceFEPIme (MAXIPIME) 2 g in sodium chloride 0.9 % 100 mL IVPB (has no administration in time range)  metroNIDAZOLE (FLAGYL) IVPB 500 mg (has no administration in time range)  lactated ringers bolus 1,000 mL (1,000 mLs Intravenous New Bag/Given 02/25/21 2343)    ED Course  I have reviewed the triage vital signs and the nursing notes.  Pertinent labs & imaging results that were available during my care of the patient were reviewed by me and considered in my medical decision making (see chart for details).    MDM Rules/Calculators/A&P                          Sudden onset now resolved abdominal pain.  Due to age will likely need advanced imaging.  We will get screening labs to check for organ function electrolyte abnormalities.  CT imaging reviewed by radiology myself shows a thickened gallbladder wall with edema concern for acute cholecystitis.  No other changes.  Mild rising creatinine based on labs, given IV fluids.  Patient is heart rate and respiratory rate are rising.  There is a slight leukocytosis.   With this I have concerns for inflammatory changes that could be sepsis related.  He will get broad-spectrum antibiotics cultures will be drawn remained stable.  He will go for ultrasound.  He will need medicine admission possible gastroenterology consultation for further diagnostic testing.  Lipase is elevated as well. Final Clinical Impression(s) / ED Diagnoses Final diagnoses:  Abdominal pain  Acute pancreatitis, unspecified complication status, unspecified pancreatitis type  Transaminitis  Sepsis, due to unspecified organism, unspecified whether acute organ dysfunction present [facility])    Rx / DC Orders ED Discharge Orders    None

## 2021-02-26 NOTE — ED Notes (Signed)
Patient transported to MRI 

## 2021-02-26 NOTE — Consult Note (Addendum)
Consultation  Referring Provider: Dr. Myna Hidalgo    Primary Care Physician:  Idelle Crouch, MD Primary Gastroenterologist: Althia Forts (normally seems [facility] clinic)        Reason for Consultation: Biliary pancreatitis            HPI:   Leroy [name] is a 87 y.o. male with a past medical history as listed below including dementia, CKD, PVD and stroke on Plavix (03/11/2020 echo with an LVEF 60-65%), who presented to the ER yesterday with a complaint of left lower quadrant pain, on CT they then discovered distended gallbladder and lipase came back elevated.  We are consulted in regards to biliary pancreatitis.    Today, the patient is seen with his daughter who is his POA and his second wife at his bedside.  They help to explain that he started to have abdominal pain yesterday and was taken to the ER as above.  Initially he described some lower abdominal pain to them but "he has dementia", they explained that then on exam he was tender in the right upper quadrant which was more consistent with CT findings.  Apparently has had 2 separate hernia surgeries in the past to try and fix inguinal hernias which have been unsuccessful.  Over the past 12 hours patient has really felt well with no complaints of abdominal pain "unless someone is pushing on him".  He has been resting well this morning.    His last dose of Plavix was yesterday 02/26/2019 2 in the morning.    Denies fever, chills, weight loss, change in bowel habits, nausea or vomiting.  ED course: Lipase 3570, AST 215, ALT 85, total bili 1.6, WBC of 10.6, CT of abdomen pelvis without contrast showed distended gallbladder with gallbladder wall thickening and edema suggesting acute cholecystitis and a moderate size left inguinal hernia containing fat and sigmoid colon with no colonic obstruction; MRCP ordered without contrast which shows tiny stones or less likely debris in the thick-walled gallbladder and to a lesser extent in the borderline  dilated common bile duct measuring up to 0.9 cm, some mild abrupt truncation of the distal CBD which could possibly related to choledocholithiasis, perihepatic ascites, mild nonspecific periportal edema, upper normal/borderline prominent right gastric, porta hepatis and peripancreatic lymph nodes, likely reactive, mild cardiomegaly and hiatal hernia  GI history: 12/02/2018 office visit for follow-up of dysphagia and EGD with dilation after finding of Schatzki's ring-at that time doing well 11/09/2018 EGD for dysphagia with a moderate Schatzki's ring at the GE junction, dilated   Past Medical History:  Diagnosis Date  . Anemia   . Bursitis   . Cancer ([place])    skin cancer face  . Chronic kidney disease   . Dementia ([place])   . Dyspnea   . GI bleed   . History of hiatal hernia   . Hyperlipidemia   . Hypertension   . IVCD (intraventricular conduction defect)   . Leg weakness, bilateral   . Obesity   . Peripheral vascular disease ([place])   . Stomach ulcer   . Stroke [facility])    feb 2017, weakness in legs,   . Tremor   . Vitamin B 12 deficiency   . Vitamin D deficiency     Past Surgical History:  Procedure Laterality Date  . BACK SURGERY    . ESOPHAGOGASTRODUODENOSCOPY (EGD) WITH PROPOFOL N/A 11/09/2018   Procedure: ESOPHAGOGASTRODUODENOSCOPY (EGD) WITH PROPOFOL;  Surgeon: Manya Silvas, MD;  Location: [facility] ENDOSCOPY;  Service: Endoscopy;  Laterality: N/A;  . EYE SURGERY      bilateral cataracts  . HERNIA REPAIR     inguinal on left  . INGUINAL HERNIA REPAIR Right 12/29/2017   Procedure: HERNIA REPAIR INGUINAL ADULT;  Surgeon: Herbert Pun, MD;  Location: [facility] ORS;  Service: General;  Laterality: Right;  . INSERTION OF MESH Right 12/29/2017   Procedure: INSERTION OF MESH;  Surgeon: Herbert Pun, MD;  Location: [facility] ORS;  Service: General;  Laterality: Right;  . LUMBAR LAMINECTOMY/DECOMPRESSION MICRODISCECTOMY N/A 08/12/2016   Procedure: LAMINECTOMY AND FORAMINOTOMY  LUMBAR FOUR - LUMBAR FIVE;  Surgeon: Newman Pies, MD;  Location: [place];  Service: Neurosurgery;  Laterality: N/A;  LAMINECTOMY AND FORAMINOTOMY L4-L5    Family History  Problem Relation Age of Onset  . CAD Mother   . Cancer Father      Social History   Tobacco Use  . Smoking status: Never Smoker  . Smokeless tobacco: Current User    Types: Chew  Vaping Use  . Vaping Use: Never used  Substance Use Topics  . Alcohol use: No  . Drug use: No    Prior to Admission medications   Medication Sig Start Date End Date Taking? Authorizing Provider  acetaminophen (TYLENOL) 500 MG tablet Take 500 mg by mouth daily as needed for moderate pain or headache.    [provider]  cholecalciferol (VITAMIN D) 1000 units tablet Take 2,000 Units by mouth daily.     [provider]  clopidogrel (PLAVIX) 75 MG tablet Take 75 mg by mouth daily. 05/13/20   [provider]  cyanocobalamin (,VITAMIN B-12,) 1000 MCG/ML injection Inject 1,000 mcg into the muscle every 30 (thirty) days.    [provider]  ezetimibe (ZETIA) 10 MG tablet Take 10 mg by mouth at bedtime. 11/05/20   [provider]  isosorbide mononitrate (IMDUR) 30 MG 24 hr tablet Take 30 mg by mouth daily. 12/09/20   [provider]  levothyroxine (SYNTHROID) 50 MCG tablet Take 50 mcg by mouth daily. 11/08/20   [provider]  metoprolol succinate (TOPROL-XL) 25 MG 24 hr tablet Take 12.5 mg by mouth in the morning.  02/27/20 02/26/21  [provider]  pantoprazole (PROTONIX) 40 MG tablet Take 1 tablet (40 mg total) by mouth daily. 03/11/20 06/21/20  Arrien, Jimmy Picket, MD    Current Facility-Administered Medications  Medication Dose Route Frequency Provider Last Rate Last Admin  . fentaNYL (SUBLIMAZE) injection 12.5-25 mcg  12.5-25 mcg Intravenous Q2H PRN Opyd, Ilene Qua, MD      . labetalol (NORMODYNE) injection 10 mg  10 mg Intravenous Q4H PRN Opyd, Ilene Qua, MD      .  lactated ringers infusion   Intravenous Continuous Opyd, Ilene Qua, MD 100 mL/hr at 02/26/21 0942 New Bag at 02/26/21 0942  . ondansetron (ZOFRAN) tablet 4 mg  4 mg Oral Q6H PRN Opyd, Ilene Qua, MD       Or  . ondansetron (ZOFRAN) injection 4 mg  4 mg Intravenous Q6H PRN Opyd, Ilene Qua, MD      . piperacillin-tazobactam (ZOSYN) IVPB 3.375 g  3.375 g Intravenous Q8H Erenest Blank, RPH 12.5 mL/hr at 02/26/21 0934 3.375 g at 02/26/21 0934    Allergies as of 02/25/2021 - Review Complete 02/25/2021  Allergen Reaction Noted  . Aspirin Other (See Comments) 12/12/2015     Review of Systems:    Constitutional: No weight loss, fever or chills Skin: No rash  Cardiovascular: No chest pain Respiratory:  No SOB  Gastrointestinal: See HPI and otherwise negative Genitourinary: No dysuria  Neurological: No syncope Musculoskeletal: No new muscle or joint pain Hematologic: No bleeding  Psychiatric: +dementia   Physical Exam:  Vital signs in last 24 hours: Temp:  [98.2 F (36.8 C)-98.7 F (37.1 C)] 98.2 F (36.8 C) (04/27 0752) Pulse Rate:  [84-103] 93 (04/27 0752) Resp:  [16-24] 17 (04/27 0752) BP: (118-178)/(87-104) 164/89 (04/27 0752) SpO2:  [95 %-100 %] 96 % (04/27 0752) Weight:  [100.7 kg] 100.7 kg (04/26 2043)   General:   Pleasant elderly pleasantly demented Caucasian male appears to be in NAD, Well developed, Well nourished, alert and cooperative Head:  Normocephalic and atraumatic. Eyes:   PEERL, EOMI. No icterus. Conjunctiva pink. Ears:  Normal auditory acuity. Neck:  Supple Throat: Oral cavity and pharynx without inflammation, swelling or lesion.  Lungs: Respirations even and unlabored. Lungs clear to auscultation bilaterally.   No wheezes, crackles, or rhonchi.  Heart: Normal S1, S2. No MRG. Regular rate and rhythm. No peripheral edema, cyanosis or pallor.  Abdomen:  Soft, nondistended, nontender. No rebound or guarding. Normal bowel sounds. No appreciable masses or  hepatomegaly. Rectal:  Not performed.  Msk:  Symmetrical without gross deformities. Peripheral pulses intact.  Extremities:  Without edema, no deformity or joint abnormality. Normal ROM, normal sensation. Neurologic:  Alert and  oriented x4;  grossly normal neurologically. Skin:   Dry and intact without significant lesions or rashes. Psychiatric: Demonstrates good judgement and reason without abnormal affect or behaviors.   LAB RESULTS: Recent Labs    02/25/21 2130 02/26/21 0248  WBC 10.6* 7.4  HGB 12.1* 11.7*  HCT 37.0* 35.0*  PLT 265 226   BMET Recent Labs    02/25/21 2130 02/26/21 0248  NA 140 138  K 3.7 3.4*  CL 108 103  CO2 26 24  GLUCOSE 102* 142*  BUN 23 20  CREATININE 1.71* 1.63*  CALCIUM 8.8* 8.7*   Hepatic Function Latest Ref Rng & Units 02/26/2021 02/25/2021 01/04/2021  Total Protein 6.5 - 8.1 g/dL 6.0(L) 6.7 6.8  Albumin 3.5 - 5.0 g/dL 3.2(L) 3.4(L) 3.7  AST 15 - 41 U/L 790(H) 215(H) 23  ALT 0 - 44 U/L 355(H) 85(H) 12  Alk Phosphatase 38 - 126 U/L 128(H) 98 77  Total Bilirubin 0.3 - 1.2 mg/dL 2.3(H) 1.6(H) 0.8    STUDIES: CT ABDOMEN PELVIS WO CONTRAST  Result Date: 02/25/2021 CLINICAL DATA:  Left lower quadrant abdominal pain. EXAM: CT ABDOMEN AND PELVIS WITHOUT CONTRAST TECHNIQUE: Multidetector CT imaging of the abdomen and pelvis was performed following the standard protocol without IV contrast. COMPARISON:  05/19/2006 FINDINGS: Lower chest: Emphysematous changes and mild interstitial fibrosis in the lung bases. Small esophageal hiatal hernia. Hepatobiliary: No focal liver lesions. Gallbladder is distended with evidence of gallbladder wall thickening and edema. No stones identified. Changes could indicate acute cholecystitis. No bile duct dilatation. Pancreas: Unremarkable. No pancreatic ductal dilatation or surrounding inflammatory changes. Spleen: Normal in size without focal abnormality. Adrenals/Urinary Tract: Adrenal glands are unremarkable. Kidneys are  normal, without renal calculi, focal lesion, or hydronephrosis. Bladder is unremarkable. Stomach/Bowel: Stomach, small bowel, and colon are not abnormally distended. Scattered stool throughout the colon. No wall thickening or inflammatory changes appreciated. Appendix is normal. There is a moderate-sized left inguinal hernia containing fat and sigmoid colon. No proximal colonic obstruction. Vascular/Lymphatic: Aortic atherosclerosis. No enlarged abdominal or pelvic lymph nodes. Reproductive: Prostate gland is mildly enlarged. Other: No free air or free fluid in the abdomen.  Musculoskeletal: Prominent degenerative changes in the spine. No destructive bone lesions. IMPRESSION: 1. Distended gallbladder with gallbladder wall thickening and edema. No stones identified. Changes suggest acute cholecystitis. 2. Moderate-sized left inguinal hernia containing fat and sigmoid colon. No proximal colonic obstruction. 3. Emphysematous changes and mild interstitial fibrosis in the lung bases. 4. Small esophageal hiatal hernia. 5. Aortic atherosclerosis. 6. Mildly enlarged prostate gland. Aortic Atherosclerosis (ICD10-I70.0) and Emphysema (ICD10-J43.9). Electronically Signed   By: Lucienne Capers M.D.   On: 02/25/2021 23:32   MR ABDOMEN MRCP WO CONTRAST  Result Date: 02/26/2021 CLINICAL DATA:  Pancreatitis and suspected right upper quadrant abdominal pain. Left lower quadrant abdominal pain. EXAM: MRI ABDOMEN WITHOUT CONTRAST  (INCLUDING MRCP) TECHNIQUE: Multiplanar multisequence MR imaging of the abdomen was performed. Heavily T2-weighted images of the biliary and pancreatic ducts were obtained, and three-dimensional MRCP images were rendered by post processing. COMPARISON:  Multiple exams, including CT 02/25/2021 FINDINGS: Despite efforts by the technologist and patient, motion artifact is present on today's exam and could not be eliminated. This reduces exam sensitivity and specificity. Lower chest: Mild cardiomegaly.  Hepatobiliary: Mild periportal edema. Substantial gallbladder wall thickening about 0.6 cm. Tiny dependent filling defects are present in the common bile duct for example on image 23 of series 8 and image 24 series 4. Appearance compatible with either debris or tiny stones. Similarly there is some subtle dependent multifocal filling defect along the dependent wall of the gallbladder on image 20 series 4 again compatible with debris or tiny gallstones. The common bile duct measures up to 0.9 cm in diameter, borderline prominent for the patient's age. Slightly abrupt truncation of the distal CBD on image 22 series 5. Pancreas: Borderline prominence of the dorsal pancreatic duct in the pancreatic head and body. Spleen:  Unremarkable Adrenals/Urinary Tract:  Unremarkable Stomach/Bowel: Small type 1 hiatal hernia. Vascular/Lymphatic: Aortoiliac atherosclerotic vascular disease. Borderline enlarged right gastric, celiac, porta hepatis, and peripancreatic lymph nodes. An index right gastric node measures 0.8 cm in short axis on image 15 series 8. Portacaval node 1.2 cm in short axis, image 20 series 8. Other: Mild perihepatic ascites. Low-level T2 hyperintense stranding/edema along the porta hepatis and descending duodenum. Musculoskeletal: The lumbar spondylosis and degenerative disc disease with mild levoconvex lumbar scoliosis. Postoperative findings in the lower lumbar spine. IMPRESSION: 1. Tiny stones or less likely debris in the thick-walled gallbladder and to a lesser extent in the borderline dilated common bile duct. There is some mild abrupt truncation of the distal CBD which could possibly be related to choledocholithiasis. Perihepatic ascites. If the clinical picture of whether the patient has cholecystitis is still uncertain, nuclear medicine hepatobiliary scan can assess patency of the cystic duct. 2. Mild nonspecific periportal edema. 3. Upper normal/borderline prominent right gastric, porta hepatis, and  peripancreatic lymph nodes. Likely reactive. 4.  Aortic Atherosclerosis (ICD10-I70.0). 5. Lumbar spondylosis and degenerative disc disease along with postoperative findings in the lumbar spine. 6. Mild cardiomegaly. 7. Hiatal hernia. Electronically Signed   By: Van Clines M.D.   On: 02/26/2021 08:14   MR 3D Recon At Scanner  Result Date: 02/26/2021 CLINICAL DATA:  Pancreatitis and suspected right upper quadrant abdominal pain. Left lower quadrant abdominal pain. EXAM: MRI ABDOMEN WITHOUT CONTRAST  (INCLUDING MRCP) TECHNIQUE: Multiplanar multisequence MR imaging of the abdomen was performed. Heavily T2-weighted images of the biliary and pancreatic ducts were obtained, and three-dimensional MRCP images were rendered by post processing. COMPARISON:  Multiple exams, including CT 02/25/2021 FINDINGS: Despite efforts by the technologist and  patient, motion artifact is present on today's exam and could not be eliminated. This reduces exam sensitivity and specificity. Lower chest: Mild cardiomegaly. Hepatobiliary: Mild periportal edema. Substantial gallbladder wall thickening about 0.6 cm. Tiny dependent filling defects are present in the common bile duct for example on image 23 of series 8 and image 24 series 4. Appearance compatible with either debris or tiny stones. Similarly there is some subtle dependent multifocal filling defect along the dependent wall of the gallbladder on image 20 series 4 again compatible with debris or tiny gallstones. The common bile duct measures up to 0.9 cm in diameter, borderline prominent for the patient's age. Slightly abrupt truncation of the distal CBD on image 22 series 5. Pancreas: Borderline prominence of the dorsal pancreatic duct in the pancreatic head and body. Spleen:  Unremarkable Adrenals/Urinary Tract:  Unremarkable Stomach/Bowel: Small type 1 hiatal hernia. Vascular/Lymphatic: Aortoiliac atherosclerotic vascular disease. Borderline enlarged right gastric, celiac,  porta hepatis, and peripancreatic lymph nodes. An index right gastric node measures 0.8 cm in short axis on image 15 series 8. Portacaval node 1.2 cm in short axis, image 20 series 8. Other: Mild perihepatic ascites. Low-level T2 hyperintense stranding/edema along the porta hepatis and descending duodenum. Musculoskeletal: The lumbar spondylosis and degenerative disc disease with mild levoconvex lumbar scoliosis. Postoperative findings in the lower lumbar spine. IMPRESSION: 1. Tiny stones or less likely debris in the thick-walled gallbladder and to a lesser extent in the borderline dilated common bile duct. There is some mild abrupt truncation of the distal CBD which could possibly be related to choledocholithiasis. Perihepatic ascites. If the clinical picture of whether the patient has cholecystitis is still uncertain, nuclear medicine hepatobiliary scan can assess patency of the cystic duct. 2. Mild nonspecific periportal edema. 3. Upper normal/borderline prominent right gastric, porta hepatis, and peripancreatic lymph nodes. Likely reactive. 4.  Aortic Atherosclerosis (ICD10-I70.0). 5. Lumbar spondylosis and degenerative disc disease along with postoperative findings in the lumbar spine. 6. Mild cardiomegaly. 7. Hiatal hernia. Electronically Signed   By: Van Clines M.D.   On: 02/26/2021 08:14   US Abdomen Limited RUQ (LIVER/GB)  Result Date: 02/26/2021 CLINICAL DATA:  Abdominal pain EXAM: ULTRASOUND ABDOMEN LIMITED RIGHT UPPER QUADRANT COMPARISON:  CT 02/25/2021 FINDINGS: Gallbladder: Diffuse gallbladder wall thickening, with gallbladder wall thickness measuring 9.3 mm. No gallstones are demonstrated. Murphy's sign is negative. Gallbladder wall thickening is nonspecific and could be due to cholecystitis, liver disease, or adenomyosis. Common bile duct: Diameter: 6.3 mm, upper limits of normal Liver: No focal lesion identified. Within normal limits in parenchymal echogenicity. Portal vein is patent on  color Doppler imaging with normal direction of blood flow towards the liver. Other: None. IMPRESSION: Diffuse gallbladder wall thickening, nonspecific. No cholelithiasis. Negative Murphy's sign. Electronically Signed   By: Lucienne Capers M.D.   On: 02/26/2021 00:20    Impression / Plan:   Impression: 1.  Acute biliary pancreatitis with choledocholithiasis: As seen on CT and confirmed on MRCP as above 2.  Elevated LFTs: With above 3.  History of a stroke on Plavix: Last dose 02/25/21 AM 4.  CKD stage III 5.  Dementia  Plan: 1.  Patient will likely not need ERCP given Dr. Henrene Pastor reviewed MRCP and does not feel it necessary. Will need to wait at least 5 days for Plavix washout for any procedures, but will leave this to surgical team. Would benefit from cholecystectomy with IOC.  2.  Agree with surgical consultation 3.  Continue antibiotics and fluids for pancreatitis as  well as antiemetics and analgesics 4.  Continue to monitor LFTs daily 5.  Please await any further recommendations from Dr. Henrene Pastor later today  Thank you for your kind consultation, we will continue to follow.  Lavone Nian [facility]  02/26/2021, 11:44 AM  GI ATTENDING  History, laboratories, x-rays reviewed.  Patient seen and examined.  Agree with comprehensive consultation note as outlined above.  Patient presents with acute biliary pancreatitis.  Associated abnormal liver test.  Advanced imaging shows gallstones.  Review of the MRCP does not demonstrate obvious common duct stone.  The distal tapering of the bile duct may very well be within normal limits or secondary to pancreatic edema.  No indication for ERCP.  My recommendation is supportive care for pancreatitis.  I would also recommend, after Plavix washout, laparoscopic cholecystectomy with intraoperative cholangiogram.  In the interim, trend liver tests.  We are available if needed, but will leave management to internal medicine and general surgery, at this point.  Thank  you  Docia Chuck. Geri Seminole., M.D. [facility] Division of Gastroenterology

## 2021-02-26 NOTE — Progress Notes (Addendum)
Patient seen and examined, admitted earlier this morning by Dr. Myna Hidalgo, please see his H&P for details, briefly Leroy [name] is an 87 year old male with history of dementia, CKD 3B, hypertension who presented to the ER for evaluation of acute onset severe abdominal pain that started on 4/26. -In the ED he was noted to have lipase of 3570, abnormal LFTs, WBC of 10.6, CT abdomen pelvis concerning for distended gallbladder with wall thickening.   Acute gallstone pancreatitis Choledocholithiasis  ?cholecystitis -MRCP concerning for Choleithiasis, also suspicion for Choledocholithiasis -IV fluids, continue IV Zosyn today -N.p.o. -Gastroenterology consulted  CKD 3B -Creatinine close to baseline, continue IV fluids today  Dementia -At risk of delirium, caution with meds  History of CVA -Plavix on hold  Hypertension -Antihypertensives on hold while n.p.o.   DVT prophylaxis: SCDs CODE STATUS: DNR per daughter, will update  Leroy Polite, MD

## 2021-02-26 NOTE — Progress Notes (Signed)
Pharmacy Antibiotic Note  Leroy [name] is a 87 y.o. male admitted on 02/25/2021 with cholecystitis/pancreatitis .  Pharmacy has been consulted for Zosyn dosing. WBC 10.6. Noted renal dysfunction.   Plan: Zosyn 3.375G IV q8h to be infused over 4 hours Trend WBC, temp, renal function  F/U infectious work-up   Height: 6\' 1"  (185.4 cm) Weight: 100.7 kg (222 lb 0.1 oz) IBW/kg (Calculated) : 79.9  Temp (24hrs), Avg:98.2 F (36.8 C), Min:98.2 F (36.8 C), Max:98.2 F (36.8 C)  Recent Labs  Lab 02/25/21 2129 02/25/21 2130 02/25/21 2340 02/26/21 0248  WBC  --  10.6*  --  7.4  CREATININE  --  1.71*  --  1.63*  LATICACIDVEN 1.6  --  1.9  --     Estimated Creatinine Clearance: 39.1 mL/min (A) (by C-G formula based on SCr of 1.63 mg/dL (H)).    Allergies  Allergen Reactions  . Aspirin Other (See Comments)    He has bleeding ulcers    Narda Bonds, PharmD, BCPS Clinical Pharmacist Phone: [phone]

## 2021-02-27 LAB — COMPREHENSIVE METABOLIC PANEL
ALT: 284 U/L — ABNORMAL HIGH (ref 0–44)
AST: 246 U/L — ABNORMAL HIGH (ref 15–41)
Albumin: 2.9 g/dL — ABNORMAL LOW (ref 3.5–5.0)
Alkaline Phosphatase: 126 U/L (ref 38–126)
Anion gap: 10 (ref 5–15)
BUN: 15 mg/dL (ref 8–23)
CO2: 24 mmol/L (ref 22–32)
Calcium: 8.5 mg/dL — ABNORMAL LOW (ref 8.9–10.3)
Chloride: 102 mmol/L (ref 98–111)
Creatinine, Ser: 1.42 mg/dL — ABNORMAL HIGH (ref 0.61–1.24)
GFR, Estimated: 48 mL/min — ABNORMAL LOW (ref >60)
Glucose, Bld: 83 mg/dL (ref 70–99)
Potassium: 3.4 mmol/L — ABNORMAL LOW (ref 3.5–5.1)
Sodium: 136 mmol/L (ref 135–145)
Total Bilirubin: 5.9 mg/dL — ABNORMAL HIGH (ref 0.3–1.2)
Total Protein: 6.1 g/dL — ABNORMAL LOW (ref 6.5–8.1)

## 2021-02-27 LAB — URINE CULTURE: Culture: <10000 — AB

## 2021-02-27 LAB — CBC
HCT: 35.1 % — ABNORMAL LOW (ref 39.0–52.0)
Hemoglobin: 11.8 g/dL — ABNORMAL LOW (ref 13.0–17.0)
MCH: 30.1 pg (ref 26.0–34.0)
MCHC: 33.6 g/dL (ref 30.0–36.0)
MCV: 89.5 fL (ref 80.0–100.0)
Platelets: 214 10*3/uL (ref 150–400)
RBC: 3.92 MIL/uL — ABNORMAL LOW (ref 4.22–5.81)
RDW: 13.3 % (ref 11.5–15.5)
WBC: 8.6 10*3/uL (ref 4.0–10.5)
nRBC: 0 % (ref 0.0–0.2)

## 2021-02-27 LAB — LIPASE, BLOOD: Lipase: 143 U/L — ABNORMAL HIGH (ref 11–51)

## 2021-02-27 MED ORDER — WHITE PETROLATUM EX OINT
TOPICAL_OINTMENT | CUTANEOUS | Status: AC
  Filled 2021-02-27: qty 28.35

## 2021-02-27 MED ORDER — HYPROMELLOSE (GONIOSCOPIC) 2.5 % OP SOLN
1.0000 [drp] | Freq: Three times a day (TID) | OPHTHALMIC | Status: DC | PRN
  Administered 2021-03-01: 1 [drp] via OPHTHALMIC
  Filled 2021-02-27: qty 15

## 2021-02-27 MED ORDER — ENOXAPARIN SODIUM 40 MG/0.4ML IJ SOSY
40.0000 mg | PREFILLED_SYRINGE | Freq: Every day | INTRAMUSCULAR | Status: AC
  Administered 2021-02-27 – 2021-03-02 (×4): 40 mg via SUBCUTANEOUS
  Filled 2021-02-27 (×9): qty 0.4

## 2021-02-27 MED ORDER — METOPROLOL SUCCINATE ER 25 MG PO TB24
25.0000 mg | ORAL_TABLET | Freq: Every day | ORAL | Status: DC
  Administered 2021-02-27 – 2021-03-01 (×3): 25 mg via ORAL
  Filled 2021-02-27 (×3): qty 1

## 2021-02-27 MED ORDER — ACETAMINOPHEN 325 MG PO TABS
650.0000 mg | ORAL_TABLET | Freq: Four times a day (QID) | ORAL | Status: DC | PRN
  Administered 2021-03-03 – 2021-03-05 (×2): 650 mg via ORAL
  Filled 2021-02-27 (×2): qty 2

## 2021-02-27 MED ORDER — LEVOTHYROXINE SODIUM 50 MCG PO TABS
50.0000 ug | ORAL_TABLET | Freq: Every day | ORAL | Status: DC
  Administered 2021-02-28 – 2021-03-06 (×7): 50 ug via ORAL
  Filled 2021-02-27 (×7): qty 1

## 2021-02-27 MED ORDER — PANTOPRAZOLE SODIUM 40 MG PO TBEC
40.0000 mg | DELAYED_RELEASE_TABLET | Freq: Every day | ORAL | Status: DC
  Administered 2021-02-27 – 2021-03-05 (×7): 40 mg via ORAL
  Filled 2021-02-27 (×7): qty 1

## 2021-02-27 NOTE — Progress Notes (Addendum)
PROGRESS NOTE    Leroy [name]  WFU:[id number] DOB: 1930-08-18 DOA: 02/25/2021 PCP: Idelle Crouch, MD  Brief Narrative: 87 year old male with history of dementia, CKD 3B, hypertension who presented to the ER for evaluation of acute onset severe abdominal pain that started on 4/26. -In the ED he was noted to have lipase of 3570, abnormal LFTs, WBC of 10.6, CT abdomen pelvis concerning for distended gallbladder with wall thickening.  With Cholelithiasis  Assessment & Plan:   Acute gallstone pancreatitis Choledocholithiasis  ?cholecystitis -MRCP concerning for Choleithiasis, cannot rule out choledocholithiasis, GI suspects abrupt truncation of distal CBD could be secondary to pancreatic edema, recommended against ERCP -Bili higher today, other LFTs improving -IV fluids, continue IV Zosyn, sepsis ruled out -General surgery following -Plan to consider lap chole with IOC if LFTs, bili trend down  CKD 3B -Creatinine close to baseline, continue IV fluids today while n.p.o.  Dementia -At risk of delirium, caution with meds  History of CVA -Plavix on hold  Hypertension -Restart Toprol, Imdur on hold  Hypothyroidism -Restart Synthroid  DVT prophylaxis: Lovenox Code Status: DO NOT RESUSCITATE Family Communication: Wife and daughter at bedside Disposition Plan:  Status is: Inpatient  Remains inpatient appropriate because:Inpatient level of care appropriate due to severity of illness   Dispo: The patient is from: Home              Anticipated d/c is to: Home              Patient currently is not medically stable to d/c.   Difficult to place patient No  Consultants:   General surgery  Gastroenterology   Procedures:   Antimicrobials:    Subjective: -Feels better, denies any abdominal pain nausea or vomiting  Objective: Vitals:   02/26/21 1418 02/26/21 1940 02/27/21 0157 02/27/21 0515  BP: (!) 156/86 (!) 158/80 (!) 141/83 (!) 158/80  Pulse: 77 79 72 74   Resp: 18 20  16   Temp: 98.6 F (37 C) 100.1 F (37.8 C) 97.8 F (36.6 C) 97.8 F (36.6 C)  TempSrc: Oral Oral Oral Oral  SpO2: 96% 95% 97% 95%  Weight:      Height:        Intake/Output Summary (Last 24 hours) at 02/27/2021 1038 Last data filed at 02/27/2021 2025 Gross per 24 hour  Intake 975.59 ml  Output 2625 ml  Net -1649.41 ml   Filed Weights   02/25/21 2043  Weight: 100.7 kg    Examination:  General exam: Elderly pleasant male laying in bed, awake alert oriented to self and place only, cognitive deficits noted CVS: S1-S2, regular rate rhythm Lungs: Clear anteriorly Abdomen: Soft, mildly distended, nontender, bowel sounds present Extremities: No edema Psychiatry: Poor insight  Data Reviewed:   CBC: Recent Labs  Lab 02/25/21 2130 02/26/21 0248 02/27/21 0100  WBC 10.6* 7.4 8.6  NEUTROABS 8.6*  --   --   HGB 12.1* 11.7* 11.8*  HCT 37.0* 35.0* 35.1*  MCV 92.5 91.4 89.5  PLT 265 226 427   Basic Metabolic Panel: Recent Labs  Lab 02/25/21 2130 02/26/21 0248 02/27/21 0100  NA 140 138 136  K 3.7 3.4* 3.4*  CL 108 103 102  CO2 26 24 24   GLUCOSE 102* 142* 83  BUN 23 20 15   CREATININE 1.71* 1.63* 1.42*  CALCIUM 8.8* 8.7* 8.5*   GFR: Estimated Creatinine Clearance: 44.9 mL/min (A) (by C-G formula based on SCr of 1.42 mg/dL (H)). Liver Function Tests: Recent Labs  Lab 02/25/21  2130 02/26/21 0248 02/27/21 0100  AST 215* 790* 246*  ALT 85* 355* 284*  ALKPHOS 98 128* 126  BILITOT 1.6* 2.3* 5.9*  PROT 6.7 6.0* 6.1*  ALBUMIN 3.4* 3.2* 2.9*   Recent Labs  Lab 02/25/21 2130 02/27/21 0100  LIPASE 3,570* 143*   No results for input(s): AMMONIA in the last 168 hours. Coagulation Profile: No results for input(s): INR, PROTIME in the last 168 hours. Cardiac Enzymes: No results for input(s): CKTOTAL, CKMB, CKMBINDEX, TROPONINI in the last 168 hours. BNP (last 3 results) No results for input(s): PROBNP in the last 8760 hours. HbA1C: No results for  input(s): HGBA1C in the last 72 hours. CBG: No results for input(s): GLUCAP in the last 168 hours. Lipid Profile: No results for input(s): CHOL, HDL, LDLCALC, TRIG, CHOLHDL, LDLDIRECT in the last 72 hours. Thyroid Function Tests: No results for input(s): TSH, T4TOTAL, FREET4, T3FREE, THYROIDAB in the last 72 hours. Anemia Panel: No results for input(s): VITAMINB12, FOLATE, FERRITIN, TIBC, IRON, RETICCTPCT in the last 72 hours. Urine analysis:    Component Value Date/Time   COLORURINE YELLOW 02/25/2021 2045   APPEARANCEUR CLEAR 02/25/2021 2045   LABSPEC 1.012 02/25/2021 2045   PHURINE 6.0 02/25/2021 2045   GLUCOSEU NEGATIVE 02/25/2021 2045   HGBUR MODERATE (A) 02/25/2021 2045   BILIRUBINUR NEGATIVE 02/25/2021 2045   [place] 02/25/2021 2045   PROTEINUR 30 (A) 02/25/2021 2045   NITRITE NEGATIVE 02/25/2021 2045   LEUKOCYTESUR NEGATIVE 02/25/2021 2045   Sepsis Labs: @LABRCNTIP (procalcitonin:4,lacticidven:4)  ) Recent Results (from the past 240 hour(s))  Resp Panel by RT-PCR (Flu A&B, Covid) Nasopharyngeal Swab     Status: None   Collection Time: 02/25/21 11:44 PM   Specimen: Nasopharyngeal Swab; Nasopharyngeal(NP) swabs in vial transport medium  Result Value Ref Range Status   SARS Coronavirus 2 by RT PCR NEGATIVE NEGATIVE Final    Comment: (NOTE) SARS-CoV-2 target nucleic acids are NOT DETECTED.  The SARS-CoV-2 RNA is generally detectable in upper respiratory specimens during the acute phase of infection. The lowest concentration of SARS-CoV-2 viral copies this assay can detect is 138 copies/mL. A negative result does not preclude SARS-Cov-2 infection and should not be used as the sole basis for treatment or other patient management decisions. A negative result may occur with  improper specimen collection/handling, submission of specimen other than nasopharyngeal swab, presence of viral mutation(s) within the areas targeted by this assay, and inadequate number of  viral copies(<138 copies/mL). A negative result must be combined with clinical observations, patient history, and epidemiological information. The expected result is Negative.  Fact Sheet for Patients:  EntrepreneurPulse.com.au  Fact Sheet for Healthcare Providers:  IncredibleEmployment.be  This test is no t yet approved or cleared by the [place] FDA and  has been authorized for detection and/or diagnosis of SARS-CoV-2 by FDA under an Emergency Use Authorization (EUA). This EUA will remain  in effect (meaning this test can be used) for the duration of the COVID-19 declaration under Section 564(b)(1) of the Act, 21 U.S.C.section 360bbb-3(b)(1), unless the authorization is terminated  or revoked sooner.       Influenza A by PCR NEGATIVE NEGATIVE Final   Influenza B by PCR NEGATIVE NEGATIVE Final    Comment: (NOTE) The Xpert Xpress SARS-CoV-2/FLU/RSV plus assay is intended as an aid in the diagnosis of influenza from Nasopharyngeal swab specimens and should not be used as a sole basis for treatment. Nasal washings and aspirates are unacceptable for Xpert Xpress SARS-CoV-2/FLU/RSV testing.  Fact Sheet for  Patients: EntrepreneurPulse.com.au  Fact Sheet for Healthcare Providers: IncredibleEmployment.be  This test is not yet approved or cleared by the [place] FDA and has been authorized for detection and/or diagnosis of SARS-CoV-2 by FDA under an Emergency Use Authorization (EUA). This EUA will remain in effect (meaning this test can be used) for the duration of the COVID-19 declaration under Section 564(b)(1) of the Act, 21 U.S.C. section 360bbb-3(b)(1), unless the authorization is terminated or revoked.  Performed at [facility], [place] [address] [place], [place] [address]   Blood culture (routine x 2)     Status: None (Preliminary result)   Collection Time: 02/26/21 12:32 AM    Specimen: BLOOD RIGHT FOREARM  Result Value Ref Range Status   Specimen Description BLOOD RIGHT FOREARM  Final   Special Requests   Final    BOTTLES DRAWN AEROBIC AND ANAEROBIC Blood Culture results may not be optimal due to an inadequate volume of blood received in culture bottles   Culture   Final    NO GROWTH 1 DAY Performed at [facility], [place] [address] [place], [place] [address]    Report Status PENDING  Incomplete  Blood culture (routine x 2)     Status: None (Preliminary result)   Collection Time: 02/26/21 12:40 AM   Specimen: BLOOD RIGHT HAND  Result Value Ref Range Status   Specimen Description BLOOD RIGHT HAND  Final   Special Requests   Final    BOTTLES DRAWN AEROBIC AND ANAEROBIC Blood Culture results may not be optimal due to an inadequate volume of blood received in culture bottles   Culture   Final    NO GROWTH 1 DAY Performed at [facility], [place] [address], [place], [place] [address]    Report Status PENDING  Incomplete  Surgical pcr screen     Status: None   Collection Time: 02/26/21  9:12 PM   Specimen: Nasal Mucosa; Nasal Swab  Result Value Ref Range Status   MRSA, PCR NEGATIVE NEGATIVE Final   Staphylococcus aureus NEGATIVE NEGATIVE Final    Comment: (NOTE) The Xpert SA Assay (FDA approved for NASAL specimens in patients 79 years of age and older), is one component of a comprehensive surveillance program. It is not intended to diagnose infection nor to guide or monitor treatment. Performed at [facility], [place] [address] [place], [place] [address]          Radiology Studies: CT ABDOMEN PELVIS WO CONTRAST  Result Date: 02/25/2021 CLINICAL DATA:  Left lower quadrant abdominal pain. EXAM: CT ABDOMEN AND PELVIS WITHOUT CONTRAST TECHNIQUE: Multidetector CT imaging of the abdomen and pelvis was performed following the standard protocol without IV contrast. COMPARISON:  05/19/2006 FINDINGS: Lower chest: Emphysematous changes and mild  interstitial fibrosis in the lung bases. Small esophageal hiatal hernia. Hepatobiliary: No focal liver lesions. Gallbladder is distended with evidence of gallbladder wall thickening and edema. No stones identified. Changes could indicate acute cholecystitis. No bile duct dilatation. Pancreas: Unremarkable. No pancreatic ductal dilatation or surrounding inflammatory changes. Spleen: Normal in size without focal abnormality. Adrenals/Urinary Tract: Adrenal glands are unremarkable. Kidneys are normal, without renal calculi, focal lesion, or hydronephrosis. Bladder is unremarkable. Stomach/Bowel: Stomach, small bowel, and colon are not abnormally distended. Scattered stool throughout the colon. No wall thickening or inflammatory changes appreciated. Appendix is normal. There is a moderate-sized left inguinal hernia containing fat and sigmoid colon. No proximal colonic obstruction. Vascular/Lymphatic: Aortic atherosclerosis. No enlarged abdominal or pelvic lymph  nodes. Reproductive: Prostate gland is mildly enlarged. Other: No free air or free fluid in the abdomen. Musculoskeletal: Prominent degenerative changes in the spine. No destructive bone lesions. IMPRESSION: 1. Distended gallbladder with gallbladder wall thickening and edema. No stones identified. Changes suggest acute cholecystitis. 2. Moderate-sized left inguinal hernia containing fat and sigmoid colon. No proximal colonic obstruction. 3. Emphysematous changes and mild interstitial fibrosis in the lung bases. 4. Small esophageal hiatal hernia. 5. Aortic atherosclerosis. 6. Mildly enlarged prostate gland. Aortic Atherosclerosis (ICD10-I70.0) and Emphysema (ICD10-J43.9). Electronically Signed   By: Lucienne Capers M.D.   On: 02/25/2021 23:32   MR ABDOMEN MRCP WO CONTRAST  Result Date: 02/26/2021 CLINICAL DATA:  Pancreatitis and suspected right upper quadrant abdominal pain. Left lower quadrant abdominal pain. EXAM: MRI ABDOMEN WITHOUT CONTRAST  (INCLUDING  MRCP) TECHNIQUE: Multiplanar multisequence MR imaging of the abdomen was performed. Heavily T2-weighted images of the biliary and pancreatic ducts were obtained, and three-dimensional MRCP images were rendered by post processing. COMPARISON:  Multiple exams, including CT 02/25/2021 FINDINGS: Despite efforts by the technologist and patient, motion artifact is present on today's exam and could not be eliminated. This reduces exam sensitivity and specificity. Lower chest: Mild cardiomegaly. Hepatobiliary: Mild periportal edema. Substantial gallbladder wall thickening about 0.6 cm. Tiny dependent filling defects are present in the common bile duct for example on image 23 of series 8 and image 24 series 4. Appearance compatible with either debris or tiny stones. Similarly there is some subtle dependent multifocal filling defect along the dependent wall of the gallbladder on image 20 series 4 again compatible with debris or tiny gallstones. The common bile duct measures up to 0.9 cm in diameter, borderline prominent for the patient's age. Slightly abrupt truncation of the distal CBD on image 22 series 5. Pancreas: Borderline prominence of the dorsal pancreatic duct in the pancreatic head and body. Spleen:  Unremarkable Adrenals/Urinary Tract:  Unremarkable Stomach/Bowel: Small type 1 hiatal hernia. Vascular/Lymphatic: Aortoiliac atherosclerotic vascular disease. Borderline enlarged right gastric, celiac, porta hepatis, and peripancreatic lymph nodes. An index right gastric node measures 0.8 cm in short axis on image 15 series 8. Portacaval node 1.2 cm in short axis, image 20 series 8. Other: Mild perihepatic ascites. Low-level T2 hyperintense stranding/edema along the porta hepatis and descending duodenum. Musculoskeletal: The lumbar spondylosis and degenerative disc disease with mild levoconvex lumbar scoliosis. Postoperative findings in the lower lumbar spine. IMPRESSION: 1. Tiny stones or less likely debris in the  thick-walled gallbladder and to a lesser extent in the borderline dilated common bile duct. There is some mild abrupt truncation of the distal CBD which could possibly be related to choledocholithiasis. Perihepatic ascites. If the clinical picture of whether the patient has cholecystitis is still uncertain, nuclear medicine hepatobiliary scan can assess patency of the cystic duct. 2. Mild nonspecific periportal edema. 3. Upper normal/borderline prominent right gastric, porta hepatis, and peripancreatic lymph nodes. Likely reactive. 4.  Aortic Atherosclerosis (ICD10-I70.0). 5. Lumbar spondylosis and degenerative disc disease along with postoperative findings in the lumbar spine. 6. Mild cardiomegaly. 7. Hiatal hernia. Electronically Signed   By: Van Clines M.D.   On: 02/26/2021 08:14   MR 3D Recon At Scanner  Result Date: 02/26/2021 CLINICAL DATA:  Pancreatitis and suspected right upper quadrant abdominal pain. Left lower quadrant abdominal pain. EXAM: MRI ABDOMEN WITHOUT CONTRAST  (INCLUDING MRCP) TECHNIQUE: Multiplanar multisequence MR imaging of the abdomen was performed. Heavily T2-weighted images of the biliary and pancreatic ducts were obtained, and three-dimensional MRCP images were rendered  by post processing. COMPARISON:  Multiple exams, including CT 02/25/2021 FINDINGS: Despite efforts by the technologist and patient, motion artifact is present on today's exam and could not be eliminated. This reduces exam sensitivity and specificity. Lower chest: Mild cardiomegaly. Hepatobiliary: Mild periportal edema. Substantial gallbladder wall thickening about 0.6 cm. Tiny dependent filling defects are present in the common bile duct for example on image 23 of series 8 and image 24 series 4. Appearance compatible with either debris or tiny stones. Similarly there is some subtle dependent multifocal filling defect along the dependent wall of the gallbladder on image 20 series 4 again compatible with debris  or tiny gallstones. The common bile duct measures up to 0.9 cm in diameter, borderline prominent for the patient's age. Slightly abrupt truncation of the distal CBD on image 22 series 5. Pancreas: Borderline prominence of the dorsal pancreatic duct in the pancreatic head and body. Spleen:  Unremarkable Adrenals/Urinary Tract:  Unremarkable Stomach/Bowel: Small type 1 hiatal hernia. Vascular/Lymphatic: Aortoiliac atherosclerotic vascular disease. Borderline enlarged right gastric, celiac, porta hepatis, and peripancreatic lymph nodes. An index right gastric node measures 0.8 cm in short axis on image 15 series 8. Portacaval node 1.2 cm in short axis, image 20 series 8. Other: Mild perihepatic ascites. Low-level T2 hyperintense stranding/edema along the porta hepatis and descending duodenum. Musculoskeletal: The lumbar spondylosis and degenerative disc disease with mild levoconvex lumbar scoliosis. Postoperative findings in the lower lumbar spine. IMPRESSION: 1. Tiny stones or less likely debris in the thick-walled gallbladder and to a lesser extent in the borderline dilated common bile duct. There is some mild abrupt truncation of the distal CBD which could possibly be related to choledocholithiasis. Perihepatic ascites. If the clinical picture of whether the patient has cholecystitis is still uncertain, nuclear medicine hepatobiliary scan can assess patency of the cystic duct. 2. Mild nonspecific periportal edema. 3. Upper normal/borderline prominent right gastric, porta hepatis, and peripancreatic lymph nodes. Likely reactive. 4.  Aortic Atherosclerosis (ICD10-I70.0). 5. Lumbar spondylosis and degenerative disc disease along with postoperative findings in the lumbar spine. 6. Mild cardiomegaly. 7. Hiatal hernia. Electronically Signed   By: Van Clines M.D.   On: 02/26/2021 08:14   US Abdomen Limited RUQ (LIVER/GB)  Result Date: 02/26/2021 CLINICAL DATA:  Abdominal pain EXAM: ULTRASOUND ABDOMEN LIMITED  RIGHT UPPER QUADRANT COMPARISON:  CT 02/25/2021 FINDINGS: Gallbladder: Diffuse gallbladder wall thickening, with gallbladder wall thickness measuring 9.3 mm. No gallstones are demonstrated. Murphy's sign is negative. Gallbladder wall thickening is nonspecific and could be due to cholecystitis, liver disease, or adenomyosis. Common bile duct: Diameter: 6.3 mm, upper limits of normal Liver: No focal lesion identified. Within normal limits in parenchymal echogenicity. Portal vein is patent on color Doppler imaging with normal direction of blood flow towards the liver. Other: None. IMPRESSION: Diffuse gallbladder wall thickening, nonspecific. No cholelithiasis. Negative Murphy's sign. Electronically Signed   By: Lucienne Capers M.D.   On: 02/26/2021 00:20        Scheduled Meds: Continuous Infusions: . piperacillin-tazobactam (ZOSYN)  IV 12.5 mL/hr at 02/27/21 0628     LOS: 1 day    Time spent: 13min  Domenic Polite, MD [facility] 02/27/2021, 10:38 AM

## 2021-02-27 NOTE — Progress Notes (Signed)
Subjective/Chief Complaint: Not very oriented, pleasant, no ab pain, does not state any n/v   Objective: Vital signs in last 24 hours: Temp:  [97.8 F (36.6 C)-100.1 F (37.8 C)] 97.8 F (36.6 C) (04/28 0515) Pulse Rate:  [72-79] 74 (04/28 0515) Resp:  [16-20] 16 (04/28 0515) BP: (141-158)/(80-86) 158/80 (04/28 0515) SpO2:  [95 %-97 %] 95 % (04/28 0515) Last BM Date: 02/25/21  Intake/Output from previous day: 04/27 0701 - 04/28 0700 In: 1435.6 [P.O.:580; I.V.:740.5; IV Piggyback:115.1] Out: 2400 [Urine:2400] Intake/Output this shift: Total I/O In: -  Out: 525 [Urine:525]  GI: soft nt/nd  Lab Results:  Recent Labs    02/26/21 0248 02/27/21 0100  WBC 7.4 8.6  HGB 11.7* 11.8*  HCT 35.0* 35.1*  PLT 226 214   BMET Recent Labs    02/26/21 0248 02/27/21 0100  NA 138 136  K 3.4* 3.4*  CL 103 102  CO2 24 24  GLUCOSE 142* 83  BUN 20 15  CREATININE 1.63* 1.42*  CALCIUM 8.7* 8.5*   PT/INR No results for input(s): LABPROT, INR in the last 72 hours. ABG No results for input(s): PHART, HCO3 in the last 72 hours.  Invalid input(s): PCO2, PO2  Studies/Results: CT ABDOMEN PELVIS WO CONTRAST  Result Date: 02/25/2021 CLINICAL DATA:  Left lower quadrant abdominal pain. EXAM: CT ABDOMEN AND PELVIS WITHOUT CONTRAST TECHNIQUE: Multidetector CT imaging of the abdomen and pelvis was performed following the standard protocol without IV contrast. COMPARISON:  05/19/2006 FINDINGS: Lower chest: Emphysematous changes and mild interstitial fibrosis in the lung bases. Small esophageal hiatal hernia. Hepatobiliary: No focal liver lesions. Gallbladder is distended with evidence of gallbladder wall thickening and edema. No stones identified. Changes could indicate acute cholecystitis. No bile duct dilatation. Pancreas: Unremarkable. No pancreatic ductal dilatation or surrounding inflammatory changes. Spleen: Normal in size without focal abnormality. Adrenals/Urinary Tract: Adrenal  glands are unremarkable. Kidneys are normal, without renal calculi, focal lesion, or hydronephrosis. Bladder is unremarkable. Stomach/Bowel: Stomach, small bowel, and colon are not abnormally distended. Scattered stool throughout the colon. No wall thickening or inflammatory changes appreciated. Appendix is normal. There is a moderate-sized left inguinal hernia containing fat and sigmoid colon. No proximal colonic obstruction. Vascular/Lymphatic: Aortic atherosclerosis. No enlarged abdominal or pelvic lymph nodes. Reproductive: Prostate gland is mildly enlarged. Other: No free air or free fluid in the abdomen. Musculoskeletal: Prominent degenerative changes in the spine. No destructive bone lesions. IMPRESSION: 1. Distended gallbladder with gallbladder wall thickening and edema. No stones identified. Changes suggest acute cholecystitis. 2. Moderate-sized left inguinal hernia containing fat and sigmoid colon. No proximal colonic obstruction. 3. Emphysematous changes and mild interstitial fibrosis in the lung bases. 4. Small esophageal hiatal hernia. 5. Aortic atherosclerosis. 6. Mildly enlarged prostate gland. Aortic Atherosclerosis (ICD10-I70.0) and Emphysema (ICD10-J43.9). Electronically Signed   By: Lucienne Capers M.D.   On: 02/25/2021 23:32   MR ABDOMEN MRCP WO CONTRAST  Result Date: 02/26/2021 CLINICAL DATA:  Pancreatitis and suspected right upper quadrant abdominal pain. Left lower quadrant abdominal pain. EXAM: MRI ABDOMEN WITHOUT CONTRAST  (INCLUDING MRCP) TECHNIQUE: Multiplanar multisequence MR imaging of the abdomen was performed. Heavily T2-weighted images of the biliary and pancreatic ducts were obtained, and three-dimensional MRCP images were rendered by post processing. COMPARISON:  Multiple exams, including CT 02/25/2021 FINDINGS: Despite efforts by the technologist and patient, motion artifact is present on today's exam and could not be eliminated. This reduces exam sensitivity and specificity.  Lower chest: Mild cardiomegaly. Hepatobiliary: Mild periportal edema. Substantial gallbladder wall  thickening about 0.6 cm. Tiny dependent filling defects are present in the common bile duct for example on image 23 of series 8 and image 24 series 4. Appearance compatible with either debris or tiny stones. Similarly there is some subtle dependent multifocal filling defect along the dependent wall of the gallbladder on image 20 series 4 again compatible with debris or tiny gallstones. The common bile duct measures up to 0.9 cm in diameter, borderline prominent for the patient's age. Slightly abrupt truncation of the distal CBD on image 22 series 5. Pancreas: Borderline prominence of the dorsal pancreatic duct in the pancreatic head and body. Spleen:  Unremarkable Adrenals/Urinary Tract:  Unremarkable Stomach/Bowel: Small type 1 hiatal hernia. Vascular/Lymphatic: Aortoiliac atherosclerotic vascular disease. Borderline enlarged right gastric, celiac, porta hepatis, and peripancreatic lymph nodes. An index right gastric node measures 0.8 cm in short axis on image 15 series 8. Portacaval node 1.2 cm in short axis, image 20 series 8. Other: Mild perihepatic ascites. Low-level T2 hyperintense stranding/edema along the porta hepatis and descending duodenum. Musculoskeletal: The lumbar spondylosis and degenerative disc disease with mild levoconvex lumbar scoliosis. Postoperative findings in the lower lumbar spine. IMPRESSION: 1. Tiny stones or less likely debris in the thick-walled gallbladder and to a lesser extent in the borderline dilated common bile duct. There is some mild abrupt truncation of the distal CBD which could possibly be related to choledocholithiasis. Perihepatic ascites. If the clinical picture of whether the patient has cholecystitis is still uncertain, nuclear medicine hepatobiliary scan can assess patency of the cystic duct. 2. Mild nonspecific periportal edema. 3. Upper normal/borderline prominent right  gastric, porta hepatis, and peripancreatic lymph nodes. Likely reactive. 4.  Aortic Atherosclerosis (ICD10-I70.0). 5. Lumbar spondylosis and degenerative disc disease along with postoperative findings in the lumbar spine. 6. Mild cardiomegaly. 7. Hiatal hernia. Electronically Signed   By: Van Clines M.D.   On: 02/26/2021 08:14   MR 3D Recon At Scanner  Result Date: 02/26/2021 CLINICAL DATA:  Pancreatitis and suspected right upper quadrant abdominal pain. Left lower quadrant abdominal pain. EXAM: MRI ABDOMEN WITHOUT CONTRAST  (INCLUDING MRCP) TECHNIQUE: Multiplanar multisequence MR imaging of the abdomen was performed. Heavily T2-weighted images of the biliary and pancreatic ducts were obtained, and three-dimensional MRCP images were rendered by post processing. COMPARISON:  Multiple exams, including CT 02/25/2021 FINDINGS: Despite efforts by the technologist and patient, motion artifact is present on today's exam and could not be eliminated. This reduces exam sensitivity and specificity. Lower chest: Mild cardiomegaly. Hepatobiliary: Mild periportal edema. Substantial gallbladder wall thickening about 0.6 cm. Tiny dependent filling defects are present in the common bile duct for example on image 23 of series 8 and image 24 series 4. Appearance compatible with either debris or tiny stones. Similarly there is some subtle dependent multifocal filling defect along the dependent wall of the gallbladder on image 20 series 4 again compatible with debris or tiny gallstones. The common bile duct measures up to 0.9 cm in diameter, borderline prominent for the patient's age. Slightly abrupt truncation of the distal CBD on image 22 series 5. Pancreas: Borderline prominence of the dorsal pancreatic duct in the pancreatic head and body. Spleen:  Unremarkable Adrenals/Urinary Tract:  Unremarkable Stomach/Bowel: Small type 1 hiatal hernia. Vascular/Lymphatic: Aortoiliac atherosclerotic vascular disease. Borderline  enlarged right gastric, celiac, porta hepatis, and peripancreatic lymph nodes. An index right gastric node measures 0.8 cm in short axis on image 15 series 8. Portacaval node 1.2 cm in short axis, image 20 series 8. Other: Mild  perihepatic ascites. Low-level T2 hyperintense stranding/edema along the porta hepatis and descending duodenum. Musculoskeletal: The lumbar spondylosis and degenerative disc disease with mild levoconvex lumbar scoliosis. Postoperative findings in the lower lumbar spine. IMPRESSION: 1. Tiny stones or less likely debris in the thick-walled gallbladder and to a lesser extent in the borderline dilated common bile duct. There is some mild abrupt truncation of the distal CBD which could possibly be related to choledocholithiasis. Perihepatic ascites. If the clinical picture of whether the patient has cholecystitis is still uncertain, nuclear medicine hepatobiliary scan can assess patency of the cystic duct. 2. Mild nonspecific periportal edema. 3. Upper normal/borderline prominent right gastric, porta hepatis, and peripancreatic lymph nodes. Likely reactive. 4.  Aortic Atherosclerosis (ICD10-I70.0). 5. Lumbar spondylosis and degenerative disc disease along with postoperative findings in the lumbar spine. 6. Mild cardiomegaly. 7. Hiatal hernia. Electronically Signed   By: Van Clines M.D.   On: 02/26/2021 08:14   US Abdomen Limited RUQ (LIVER/GB)  Result Date: 02/26/2021 CLINICAL DATA:  Abdominal pain EXAM: ULTRASOUND ABDOMEN LIMITED RIGHT UPPER QUADRANT COMPARISON:  CT 02/25/2021 FINDINGS: Gallbladder: Diffuse gallbladder wall thickening, with gallbladder wall thickness measuring 9.3 mm. No gallstones are demonstrated. Murphy's sign is negative. Gallbladder wall thickening is nonspecific and could be due to cholecystitis, liver disease, or adenomyosis. Common bile duct: Diameter: 6.3 mm, upper limits of normal Liver: No focal lesion identified. Within normal limits in parenchymal  echogenicity. Portal vein is patent on color Doppler imaging with normal direction of blood flow towards the liver. Other: None. IMPRESSION: Diffuse gallbladder wall thickening, nonspecific. No cholelithiasis. Negative Murphy's sign. Electronically Signed   By: Lucienne Capers M.D.   On: 02/26/2021 00:20    Anti-infectives: Anti-infectives (From admission, onward)   Start     Dose/Rate Route Frequency Ordered Stop   02/26/21 1000  piperacillin-tazobactam (ZOSYN) IVPB 3.375 g        3.375 g 12.5 mL/hr over 240 Minutes Intravenous Every 8 hours 02/26/21 0456     02/26/21 0000  ceFEPIme (MAXIPIME) 2 g in sodium chloride 0.9 % 100 mL IVPB        2 g 200 mL/hr over 30 Minutes Intravenous  Once 02/25/21 2351 02/26/21 0405   02/26/21 0000  metroNIDAZOLE (FLAGYL) IVPB 500 mg        500 mg 100 mL/hr over 60 Minutes Intravenous  Once 02/25/21 2351 02/26/21 1443      Assessment/Plan: Resolving pancreatitis/choledocholithiasis  -original plan was for lap chole tomorrow but with bilirubin rising now and mrcp with possible choledocholithiasis I think that ercp likely best plan.  That would have to wait per Dr Pearletha Furl note until plavix off. -if his tb gets better indicated stone may have passed will consider lap chole prior to that    Rolm Bookbinder 02/27/2021

## 2021-02-28 LAB — CBC
HCT: 35.2 % — ABNORMAL LOW (ref 39.0–52.0)
Hemoglobin: 12 g/dL — ABNORMAL LOW (ref 13.0–17.0)
MCH: 30.4 pg (ref 26.0–34.0)
MCHC: 34.1 g/dL (ref 30.0–36.0)
MCV: 89.1 fL (ref 80.0–100.0)
Platelets: 196 10*3/uL (ref 150–400)
RBC: 3.95 MIL/uL — ABNORMAL LOW (ref 4.22–5.81)
RDW: 13.2 % (ref 11.5–15.5)
WBC: 7.2 10*3/uL (ref 4.0–10.5)
nRBC: 0 % (ref 0.0–0.2)

## 2021-02-28 LAB — COMPREHENSIVE METABOLIC PANEL
ALT: 169 U/L — ABNORMAL HIGH (ref 0–44)
AST: 101 U/L — ABNORMAL HIGH (ref 15–41)
Albumin: 2.9 g/dL — ABNORMAL LOW (ref 3.5–5.0)
Alkaline Phosphatase: 124 U/L (ref 38–126)
Anion gap: 11 (ref 5–15)
BUN: 16 mg/dL (ref 8–23)
CO2: 25 mmol/L (ref 22–32)
Calcium: 9 mg/dL (ref 8.9–10.3)
Chloride: 100 mmol/L (ref 98–111)
Creatinine, Ser: 1.52 mg/dL — ABNORMAL HIGH (ref 0.61–1.24)
GFR, Estimated: 44 mL/min — ABNORMAL LOW (ref >60)
Glucose, Bld: 81 mg/dL (ref 70–99)
Potassium: 3.4 mmol/L — ABNORMAL LOW (ref 3.5–5.1)
Sodium: 136 mmol/L (ref 135–145)
Total Bilirubin: 4.4 mg/dL — ABNORMAL HIGH (ref 0.3–1.2)
Total Protein: 6.3 g/dL — ABNORMAL LOW (ref 6.5–8.1)

## 2021-02-28 MED ORDER — LACTATED RINGERS IV SOLN: INTRAVENOUS | Status: DC

## 2021-02-28 MED ORDER — LACTATED RINGERS IV SOLN: INTRAVENOUS | Status: AC

## 2021-02-28 NOTE — Progress Notes (Signed)
PROGRESS NOTE    Leroy [name]  [id number] DOB: 1930-10-15 DOA: 02/25/2021 PCP: Idelle Crouch, MD  Brief Narrative: 87 year old male with history of dementia, CKD 3B, hypertension who presented to the ER for evaluation of acute onset severe abdominal pain that started on 4/26. -In the ED he was noted to have lipase of 3570, abnormal LFTs, WBC of 10.6, CT abdomen pelvis concerning for distended gallbladder with wall thickening.  With Cholelithiasis -Admitted with acute gallstone pancreatitis  Assessment & Plan:  Acute gallstone pancreatitis Cholelithiasis, choledocholithiasis  -MRCP concerning for Choleithiasis, cannot rule out choledocholithiasis, GI suspects abrupt truncation of distal CBD could be secondary to pancreatic edema, recommended against ERCP -LFTs improving, bili slowly trending down -Continue IV fluids and Zosyn today -General surgery following, trial of clears -Plan to consider lap chole with IOC if LFTs, bili trend down, if not may need to reconsider ERCP  CKD 3B -Creatinine close to baseline, continue IV fluids today while n.p.o.  Dementia -At risk of delirium, caution with sedating meds  History of CVA -Plavix on hold  Hypertension -Restarted Toprol, Imdur on hold  Hypothyroidism -Continue Synthroid  DVT prophylaxis: Lovenox Code Status: DO NOT RESUSCITATE Family Communication: Wife and daughter at bedside Disposition Plan:  Status is: Inpatient  Remains inpatient appropriate because:Inpatient level of care appropriate due to severity of illness   Dispo: The patient is from: Home              Anticipated d/c is to: Home              Patient currently is not medically stable to d/c.   Difficult to place patient No  Consultants:   General surgery  Gastroenterology   Procedures:   Antimicrobials:    Subjective: -Frustrated about still being in the hospital, denies any pain nausea or vomiting  Objective: Vitals:   02/27/21  0515 02/27/21 1437 02/27/21 2049 02/28/21 0309  BP: (!) 158/80 (!) 166/80 (!) 158/80 (!) 175/83  Pulse: 74 69 69 67  Resp: 16  18 18   Temp: 97.8 F (36.6 C) 97.8 F (36.6 C) 97.8 F (36.6 C) 97.6 F (36.4 C)  TempSrc: Oral Oral  Oral  SpO2: 95% 95% 97% 95%  Weight:      Height:        Intake/Output Summary (Last 24 hours) at 02/28/2021 1131 Last data filed at 02/28/2021 0800 Gross per 24 hour  Intake 482.53 ml  Output 700 ml  Net -217.47 ml   Filed Weights   02/25/21 2043  Weight: 100.7 kg    Examination:  General exam: Pleasant elderly male sitting up in bed, awake alert oriented to self and partly to place, cognitive deficits noted CVS: S1-S2, regular rhythm Lungs: Clear anteriorly Abdomen: Soft, nontender, nondistended, bowel sounds present Extremities: No edema  Psychiatry: Poor insight  Data Reviewed:   CBC: Recent Labs  Lab 02/25/21 2130 02/26/21 0248 02/27/21 0100 02/28/21 0512  WBC 10.6* 7.4 8.6 7.2  NEUTROABS 8.6*  --   --   --   HGB 12.1* 11.7* 11.8* 12.0*  HCT 37.0* 35.0* 35.1* 35.2*  MCV 92.5 91.4 89.5 89.1  PLT 265 226 214 [id number]   Basic Metabolic Panel: Recent Labs  Lab 02/25/21 2130 02/26/21 0248 02/27/21 0100 02/28/21 0512  NA 140 138 136 136  K 3.7 3.4* 3.4* 3.4*  CL 108 103 102 100  CO2 26 24 24 25   GLUCOSE 102* 142* 83 81  BUN 23 20 15  16  CREATININE 1.71* 1.63* 1.42* 1.52*  CALCIUM 8.8* 8.7* 8.5* 9.0   GFR: Estimated Creatinine Clearance: 41.9 mL/min (A) (by C-G formula based on SCr of 1.52 mg/dL (H)). Liver Function Tests: Recent Labs  Lab 02/25/21 2130 02/26/21 0248 02/27/21 0100 02/28/21 0512  AST 215* 790* 246* 101*  ALT 85* 355* 284* 169*  ALKPHOS 98 128* 126 124  BILITOT 1.6* 2.3* 5.9* 4.4*  PROT 6.7 6.0* 6.1* 6.3*  ALBUMIN 3.4* 3.2* 2.9* 2.9*   Recent Labs  Lab 02/25/21 2130 02/27/21 0100  LIPASE 3,570* 143*   No results for input(s): AMMONIA in the last 168 hours. Coagulation Profile: No results for  input(s): INR, PROTIME in the last 168 hours. Cardiac Enzymes: No results for input(s): CKTOTAL, CKMB, CKMBINDEX, TROPONINI in the last 168 hours. BNP (last 3 results) No results for input(s): PROBNP in the last 8760 hours. HbA1C: No results for input(s): HGBA1C in the last 72 hours. CBG: No results for input(s): GLUCAP in the last 168 hours. Lipid Profile: No results for input(s): CHOL, HDL, LDLCALC, TRIG, CHOLHDL, LDLDIRECT in the last 72 hours. Thyroid Function Tests: No results for input(s): TSH, T4TOTAL, FREET4, T3FREE, THYROIDAB in the last 72 hours. Anemia Panel: No results for input(s): VITAMINB12, FOLATE, FERRITIN, TIBC, IRON, RETICCTPCT in the last 72 hours. Urine analysis:    Component Value Date/Time   COLORURINE YELLOW 02/25/2021 2045   APPEARANCEUR CLEAR 02/25/2021 2045   LABSPEC 1.012 02/25/2021 2045   PHURINE 6.0 02/25/2021 2045   GLUCOSEU NEGATIVE 02/25/2021 2045   HGBUR MODERATE (A) 02/25/2021 2045   BILIRUBINUR NEGATIVE 02/25/2021 2045   [place] 02/25/2021 2045   PROTEINUR 30 (A) 02/25/2021 2045   NITRITE NEGATIVE 02/25/2021 2045   LEUKOCYTESUR NEGATIVE 02/25/2021 2045   Sepsis Labs: @LABRCNTIP (procalcitonin:4,lacticidven:4)  ) Recent Results (from the past 240 hour(s))  Resp Panel by RT-PCR (Flu A&B, Covid) Nasopharyngeal Swab     Status: None   Collection Time: 02/25/21 11:44 PM   Specimen: Nasopharyngeal Swab; Nasopharyngeal(NP) swabs in vial transport medium  Result Value Ref Range Status   SARS Coronavirus 2 by RT PCR NEGATIVE NEGATIVE Final    Comment: (NOTE) SARS-CoV-2 target nucleic acids are NOT DETECTED.  The SARS-CoV-2 RNA is generally detectable in upper respiratory specimens during the acute phase of infection. The lowest concentration of SARS-CoV-2 viral copies this assay can detect is 138 copies/mL. A negative result does not preclude SARS-Cov-2 infection and should not be used as the sole basis for treatment or other  patient management decisions. A negative result may occur with  improper specimen collection/handling, submission of specimen other than nasopharyngeal swab, presence of viral mutation(s) within the areas targeted by this assay, and inadequate number of viral copies(<138 copies/mL). A negative result must be combined with clinical observations, patient history, and epidemiological information. The expected result is Negative.  Fact Sheet for Patients:  EntrepreneurPulse.com.au  Fact Sheet for Healthcare Providers:  IncredibleEmployment.be  This test is no t yet approved or cleared by the [place] FDA and  has been authorized for detection and/or diagnosis of SARS-CoV-2 by FDA under an Emergency Use Authorization (EUA). This EUA will remain  in effect (meaning this test can be used) for the duration of the COVID-19 declaration under Section 564(b)(1) of the Act, 21 U.S.C.section 360bbb-3(b)(1), unless the authorization is terminated  or revoked sooner.       Influenza A by PCR NEGATIVE NEGATIVE Final   Influenza B by PCR NEGATIVE NEGATIVE Final    Comment: (NOTE)  The Xpert Xpress SARS-CoV-2/FLU/RSV plus assay is intended as an aid in the diagnosis of influenza from Nasopharyngeal swab specimens and should not be used as a sole basis for treatment. Nasal washings and aspirates are unacceptable for Xpert Xpress SARS-CoV-2/FLU/RSV testing.  Fact Sheet for Patients: EntrepreneurPulse.com.au  Fact Sheet for Healthcare Providers: IncredibleEmployment.be  This test is not yet approved or cleared by the [place] FDA and has been authorized for detection and/or diagnosis of SARS-CoV-2 by FDA under an Emergency Use Authorization (EUA). This EUA will remain in effect (meaning this test can be used) for the duration of the COVID-19 declaration under Section 564(b)(1) of the Act, 21 U.S.C. section  360bbb-3(b)(1), unless the authorization is terminated or revoked.  Performed at [facility], [place] [address], [place], [place] [address]   Urine culture     Status: Abnormal   Collection Time: 02/26/21 12:15 AM   Specimen: Urine, Random  Result Value Ref Range Status   Specimen Description URINE, RANDOM  Final   Special Requests NONE  Final   Culture (A)  Final    <10,000 COLONIES/mL INSIGNIFICANT GROWTH Performed at [facility], [place] [address], [place], [place] [address]    Report Status 02/27/2021 FINAL  Final  Blood culture (routine x 2)     Status: None (Preliminary result)   Collection Time: 02/26/21 12:32 AM   Specimen: BLOOD RIGHT FOREARM  Result Value Ref Range Status   Specimen Description BLOOD RIGHT FOREARM  Final   Special Requests   Final    BOTTLES DRAWN AEROBIC AND ANAEROBIC Blood Culture results may not be optimal due to an inadequate volume of blood received in culture bottles   Culture   Final    NO GROWTH 2 DAYS Performed at [facility], [place] [address], [place], [place] [address]    Report Status PENDING  Incomplete  Blood culture (routine x 2)     Status: None (Preliminary result)   Collection Time: 02/26/21 12:40 AM   Specimen: BLOOD RIGHT HAND  Result Value Ref Range Status   Specimen Description BLOOD RIGHT HAND  Final   Special Requests   Final    BOTTLES DRAWN AEROBIC AND ANAEROBIC Blood Culture results may not be optimal due to an inadequate volume of blood received in culture bottles   Culture   Final    NO GROWTH 2 DAYS Performed at [facility], [place] [address], [place], [place] [address]    Report Status PENDING  Incomplete  Surgical pcr screen     Status: None   Collection Time: 02/26/21  9:12 PM   Specimen: Nasal Mucosa; Nasal Swab  Result Value Ref Range Status   MRSA, PCR NEGATIVE NEGATIVE Final   Staphylococcus aureus NEGATIVE NEGATIVE Final    Comment: (NOTE) The Xpert SA Assay (FDA approved for NASAL  specimens in patients 75 years of age and older), is one component of a comprehensive surveillance program. It is not intended to diagnose infection nor to guide or monitor treatment. Performed at [facility], [place] [address] [place], [place] [address]          Radiology Studies: No results found.      Scheduled Meds: . enoxaparin (LOVENOX) injection  40 mg Subcutaneous Daily  . levothyroxine  50 mcg Oral Q0600  . metoprolol succinate  25 mg Oral Daily  . pantoprazole  40 mg Oral Q1200   Continuous Infusions: . lactated ringers 88 mL/hr  at 02/28/21 9794  . piperacillin-tazobactam (ZOSYN)  IV 12.5 mL/hr at 02/28/21 0635     LOS: 2 days    Time spent: 70min  Domenic Polite, MD [facility] 02/28/2021, 11:31 AM

## 2021-02-28 NOTE — Discharge Instructions (Addendum)
[facility] ______[facility] °LAPAROSCOPIC SURGERY: POST OP INSTRUCTIONS °Always review your discharge instruction sheet given to you by the facility where your surgery was performed. °IF YOU HAVE DISABILITY OR FAMILY LEAVE FORMS, YOU MUST BRING THEM TO THE OFFICE FOR PROCESSING.   °DO NOT GIVE THEM TO YOUR DOCTOR. ° °1. A prescription for pain medication may be given to you upon discharge.  Take your pain medication as prescribed, if needed.  If narcotic pain medicine is not needed, then you may take acetaminophen (Tylenol) or ibuprofen (Advil) as needed. °2. Take your usually prescribed medications unless otherwise directed. °3. If you need a refill on your pain medication, please contact your pharmacy.  They will contact our office to request authorization. Prescriptions will not be filled after 5pm or on week-ends. °4. You should follow a light diet the first few days after arrival home, such as soup and crackers, etc.  Be sure to include lots of fluids daily. °5. Most patients will experience some swelling and bruising in the area of the incisions.  Ice packs will help.  Swelling and bruising can take several days to resolve.  °6. It is common to experience some constipation if taking pain medication after surgery.  Increasing fluid intake and taking a stool softener (such as Colace) will usually help or prevent this problem from occurring.  A mild laxative (Milk of Magnesia or Miralax) should be taken according to package instructions if there are no bowel movements after 48 hours. °7. Unless discharge instructions indicate otherwise, you may remove your bandages 24-48 hours after surgery, and you may shower at that time.  You may have steri-strips (small skin tapes) in place directly over the incision.  These strips should be left on the skin for 7-10 days.  If your surgeon used skin glue on the incision, you may shower in 24 hours.  The glue will flake off over the next 2-3 weeks.  Any sutures or  staples will be removed at the office during your follow-up visit. °8. ACTIVITIES:  You may resume regular (light) daily activities beginning the next day--such as daily self-care, walking, climbing stairs--gradually increasing activities as tolerated.  You may have sexual intercourse when it is comfortable.  Refrain from any heavy lifting or straining until approved by your doctor. °a. You may drive when you are no longer taking prescription pain medication, you can comfortably wear a seatbelt, and you can safely maneuver your car and apply brakes. °b. RETURN TO WORK:  __________________________________________________________ °9. You should see your doctor in the office for a follow-up appointment approximately 2-3 weeks after your surgery.  Make sure that you call for this appointment within a day or two after you arrive home to insure a convenient appointment time. °10. OTHER INSTRUCTIONS: __________________________________________________________________________________________________________________________ __________________________________________________________________________________________________________________________ °WHEN TO CALL YOUR DOCTOR: °1. Fever over 101.0 °2. Inability to urinate °3. Continued bleeding from incision. °4. Increased pain, redness, or drainage from the incision. °5. Increasing abdominal pain ° °The clinic staff is available to answer your questions during regular business hours.  Please don’t hesitate to call and ask to speak to one of the nurses for clinical concerns.  If you have a medical emergency, go to the nearest emergency room or call 911.  A surgeon from [facility] is always on call at the hospital. °[address], [address], [place], [place]  [address] ? [address] [address], [place], [place]   [address] °[phone] ? [phone] ? FAX [phone] °Web site:   [email or web address] °

## 2021-02-28 NOTE — Progress Notes (Addendum)
Progress Note     Subjective: CC: oriented to self and place but not year or situation. He denies any complaints or abdominal pain. He denies nausea/emesis.  Wife at bedside and plan discussed with her  Objective: Vital signs in last 24 hours: Temp:  [97.6 F (36.4 C)-97.8 F (36.6 C)] 97.6 F (36.4 C) (04/29 0309) Pulse Rate:  [67-69] 67 (04/29 0309) Resp:  [18] 18 (04/29 0309) BP: (158-175)/(80-83) 175/83 (04/29 0309) SpO2:  [95 %-97 %] 95 % (04/29 0309) Last BM Date: 02/25/21  Intake/Output from previous day: 04/28 0701 - 04/29 0700 In: 482.5 [I.V.:382.5; IV Piggyback:100] Out: 1225 [Urine:1225] Intake/Output this shift: No intake/output data recorded.  PE: General: pleasant, male who is laying in bed in NAD HEENT: head is normocephalic, atraumatic.  Sclera are noninjected.  Ears and nose without any masses or lesions.  Mouth is pink and moist Heart: regular, rate, and rhythm. Palpable radial and pedal pulses bilaterally Lungs: CTAB, no wheezes, rhonchi, or rales noted.  Respiratory effort nonlabored Abd: soft, NT, ND, +BS, no masses, hernias, or organomegaly MS: all 4 extremities are symmetrical with no cyanosis, clubbing, or edema. Skin: warm and dry with no masses, lesions, or rashes Neuro: Cranial nerves 2-12 grossly intact, sensation is normal throughout Psych: alert and oriented to self and place with an appropriate affect.    Lab Results:  Recent Labs    02/27/21 0100 02/28/21 0512  WBC 8.6 7.2  HGB 11.8* 12.0*  HCT 35.1* 35.2*  PLT 214 196   BMET Recent Labs    02/27/21 0100 02/28/21 0512  NA 136 136  K 3.4* 3.4*  CL 102 100  CO2 24 25  GLUCOSE 83 81  BUN 15 16  CREATININE 1.42* 1.52*  CALCIUM 8.5* 9.0   PT/INR No results for input(s): LABPROT, INR in the last 72 hours. CMP     Component Value Date/Time   NA 136 02/28/2021 0512   K 3.4 (L) 02/28/2021 0512   CL 100 02/28/2021 0512   CO2 25 02/28/2021 0512   GLUCOSE 81 02/28/2021  0512   BUN 16 02/28/2021 0512   CREATININE 1.52 (H) 02/28/2021 0512   CALCIUM 9.0 02/28/2021 0512   PROT 6.3 (L) 02/28/2021 0512   ALBUMIN 2.9 (L) 02/28/2021 0512   AST 101 (H) 02/28/2021 0512   ALT 169 (H) 02/28/2021 0512   ALKPHOS 124 02/28/2021 0512   BILITOT 4.4 (H) 02/28/2021 0512   GFRNONAA 44 (L) 02/28/2021 0512   GFRAA 45 (L) 03/11/2020 0217   Lipase     Component Value Date/Time   LIPASE 143 (H) 02/27/2021 0100       Studies/Results: No results found.  Anti-infectives: Anti-infectives (From admission, onward)   Start     Dose/Rate Route Frequency Ordered Stop   02/26/21 1000  piperacillin-tazobactam (ZOSYN) IVPB 3.375 g        3.375 g 12.5 mL/hr over 240 Minutes Intravenous Every 8 hours 02/26/21 0456     02/26/21 0000  ceFEPIme (MAXIPIME) 2 g in sodium chloride 0.9 % 100 mL IVPB        2 g 200 mL/hr over 30 Minutes Intravenous  Once 02/25/21 2351 02/26/21 0405   02/26/21 0000  metroNIDAZOLE (FLAGYL) IVPB 500 mg        500 mg 100 mL/hr over 60 Minutes Intravenous  Once 02/25/21 2351 02/26/21 2706       Assessment/Plan Resolving pancreatitis/choledocholithiasis   - bilirubin remains elevated. continue to hold off on previously  planned lap chole and follow. Checked in with GI and they do not currently have plans for ERCP - will continue to follow bilirubin in case stone has passed and lap chole appropriate  FEN: remain NPO for now. LR 88 mL/hr ID: zosyn 4/27 > VTE: lovenox    LOS: 2 days    Winferd Humphrey, [facility] 02/28/2021, 8:01 AM Please see Amion for pager number during day hours 7:00am-4:30pm

## 2021-03-01 LAB — CBC
HCT: 37.3 % — ABNORMAL LOW (ref 39.0–52.0)
Hemoglobin: 12.5 g/dL — ABNORMAL LOW (ref 13.0–17.0)
MCH: 30.3 pg (ref 26.0–34.0)
MCHC: 33.5 g/dL (ref 30.0–36.0)
MCV: 90.3 fL (ref 80.0–100.0)
Platelets: 218 10*3/uL (ref 150–400)
RBC: 4.13 MIL/uL — ABNORMAL LOW (ref 4.22–5.81)
RDW: 13.1 % (ref 11.5–15.5)
WBC: 9.1 10*3/uL (ref 4.0–10.5)
nRBC: 0 % (ref 0.0–0.2)

## 2021-03-01 LAB — COMPREHENSIVE METABOLIC PANEL
ALT: 142 U/L — ABNORMAL HIGH (ref 0–44)
AST: 94 U/L — ABNORMAL HIGH (ref 15–41)
Albumin: 3 g/dL — ABNORMAL LOW (ref 3.5–5.0)
Alkaline Phosphatase: 159 U/L — ABNORMAL HIGH (ref 38–126)
Anion gap: 12 (ref 5–15)
BUN: 16 mg/dL (ref 8–23)
CO2: 26 mmol/L (ref 22–32)
Calcium: 8.9 mg/dL (ref 8.9–10.3)
Chloride: 99 mmol/L (ref 98–111)
Creatinine, Ser: 1.59 mg/dL — ABNORMAL HIGH (ref 0.61–1.24)
GFR, Estimated: 41 mL/min — ABNORMAL LOW (ref >60)
Glucose, Bld: 120 mg/dL — ABNORMAL HIGH (ref 70–99)
Potassium: 3.5 mmol/L (ref 3.5–5.1)
Sodium: 137 mmol/L (ref 135–145)
Total Bilirubin: 4 mg/dL — ABNORMAL HIGH (ref 0.3–1.2)
Total Protein: 6.5 g/dL (ref 6.5–8.1)

## 2021-03-01 MED ORDER — HALOPERIDOL LACTATE 5 MG/ML IJ SOLN
3.0000 mg | Freq: Once | INTRAMUSCULAR | Status: AC
  Administered 2021-03-01: 3 mg via INTRAVENOUS
  Filled 2021-03-01: qty 1

## 2021-03-01 MED ORDER — METOPROLOL SUCCINATE ER 25 MG PO TB24
12.5000 mg | ORAL_TABLET | Freq: Every day | ORAL | Status: DC
  Administered 2021-03-02 – 2021-03-06 (×5): 12.5 mg via ORAL
  Filled 2021-03-01 (×5): qty 1

## 2021-03-01 NOTE — Progress Notes (Signed)
Md on call paged that patient very confused and agitated had gotten out of bed pulled his clothes off and ripped out his IV. New orders received. Spoke with family and wife stated that daughter lives close by and will to stay with him. MD paged to get order for family to stay with patient, Arthor Captain LPN

## 2021-03-01 NOTE — Plan of Care (Signed)
  Problem: Education: Goal: Knowledge of General Education information will improve Description Including pain rating scale, medication(s)/side effects and non-pharmacologic comfort measures Outcome: Progressing   Problem: Health Behavior/Discharge Planning: Goal: Ability to manage health-related needs will improve Outcome: Progressing   

## 2021-03-01 NOTE — Progress Notes (Signed)
   Subjective/Chief Complaint: Patient had some confusion "sundowning" last night.  Treated with Haldol T. Bili remains elevated at 4.0 today Patient denies any significant abdominal pain. BM yesterday.   Objective: Vital signs in last 24 hours: Temp:  [97.8 F (36.6 C)-98.7 F (37.1 C)] 97.8 F (36.6 C) (04/30 0600) Pulse Rate:  [68-78] 70 (04/30 0600) Resp:  [15-16] 16 (04/30 0600) BP: (137-199)/(68-102) 145/68 (04/30 0600) SpO2:  [94 %-98 %] 98 % (04/30 0600) Last BM Date: 02/28/21  Intake/Output from previous day: 04/29 0701 - 04/30 0700 In: 180 [P.O.:180] Out: 1200 [Urine:1200] Intake/Output this shift: No intake/output data recorded.  Elderly male - slightly confused; not agitated Sclera - non-icteric Abd - soft, non-tender   Lab Results:  Recent Labs    02/28/21 0512 03/01/21 0045  WBC 7.2 9.1  HGB 12.0* 12.5*  HCT 35.2* 37.3*  PLT 196 218   BMET Recent Labs    02/28/21 0512 03/01/21 0045  NA 136 137  K 3.4* 3.5  CL 100 99  CO2 25 26  GLUCOSE 81 120*  BUN 16 16  CREATININE 1.52* 1.59*  CALCIUM 9.0 8.9   PT/INR No results for input(s): LABPROT, INR in the last 72 hours. ABG No results for input(s): PHART, HCO3 in the last 72 hours.  Invalid input(s): PCO2, PO2  Studies/Results: No results found.  Anti-infectives: Anti-infectives (From admission, onward)   Start     Dose/Rate Route Frequency Ordered Stop   02/26/21 1000  piperacillin-tazobactam (ZOSYN) IVPB 3.375 g        3.375 g 12.5 mL/hr over 240 Minutes Intravenous Every 8 hours 02/26/21 0456     02/26/21 0000  ceFEPIme (MAXIPIME) 2 g in sodium chloride 0.9 % 100 mL IVPB        2 g 200 mL/hr over 30 Minutes Intravenous  Once 02/25/21 2351 02/26/21 0405   02/26/21 0000  metroNIDAZOLE (FLAGYL) IVPB 500 mg        500 mg 100 mL/hr over 60 Minutes Intravenous  Once 02/25/21 2351 02/26/21 7341      Assessment/Plan: Biliary pancreatitis Lap chole canceled for today since T. Bili  remains elevated at 4.  I have touched base with Dr. Henrene Pastor of GI and he feels that the patient still has some pancreatic edema that may be causing functional biliary obstruction. Will recheck labs in AM Will continue to hold Plavix - last dose was 4/26.   Lowfat diet as tolerated today.  LOS: 3 days    Leroy [name] 03/01/2021

## 2021-03-01 NOTE — Progress Notes (Signed)
[facility] night shift.  The staff reports that the patient is confused and restless interfering with his care.  Haloperidol 3 mg IVP x1 dose ordered.  I have also authorized his daughter to stay with him at night to help him relax/reorient.  Tennis Must, MD.

## 2021-03-01 NOTE — Plan of Care (Signed)
Delerium precautions in place, reorient patient frequently  Problem: Education: Goal: Knowledge of General Education information will improve Description: Including pain rating scale, medication(s)/side effects and non-pharmacologic comfort measures Outcome: Progressing   Problem: Health Behavior/Discharge Planning: Goal: Ability to manage health-related needs will improve Outcome: Progressing   Problem: Clinical Measurements: Goal: Ability to maintain clinical measurements within normal limits will improve Outcome: Progressing Goal: Will remain free from infection Outcome: Progressing Goal: Diagnostic test results will improve Outcome: Progressing Goal: Respiratory complications will improve Outcome: Progressing Goal: Cardiovascular complication will be avoided Outcome: Progressing   Problem: Activity: Goal: Risk for activity intolerance will decrease Outcome: Progressing   Problem: Nutrition: Goal: Adequate nutrition will be maintained Outcome: Progressing   Problem: Coping: Goal: Level of anxiety will decrease Outcome: Progressing   Problem: Safety: Goal: Ability to remain free from injury will improve Outcome: Progressing

## 2021-03-01 NOTE — Plan of Care (Signed)

## 2021-03-02 ENCOUNTER — Inpatient Hospital Stay (HOSPITAL_COMMUNITY): Payer: Medicare Other

## 2021-03-02 DIAGNOSIS — E876 Hypokalemia: Secondary | ICD-10-CM

## 2021-03-02 LAB — LIPASE, BLOOD: Lipase: 85 U/L — ABNORMAL HIGH (ref 11–51)

## 2021-03-02 LAB — COMPREHENSIVE METABOLIC PANEL
ALT: 129 U/L — ABNORMAL HIGH (ref 0–44)
AST: 102 U/L — ABNORMAL HIGH (ref 15–41)
Albumin: 2.8 g/dL — ABNORMAL LOW (ref 3.5–5.0)
Alkaline Phosphatase: 146 U/L — ABNORMAL HIGH (ref 38–126)
Anion gap: 12 (ref 5–15)
BUN: 21 mg/dL (ref 8–23)
CO2: 24 mmol/L (ref 22–32)
Calcium: 8.8 mg/dL — ABNORMAL LOW (ref 8.9–10.3)
Chloride: 101 mmol/L (ref 98–111)
Creatinine, Ser: 1.6 mg/dL — ABNORMAL HIGH (ref 0.61–1.24)
GFR, Estimated: 41 mL/min — ABNORMAL LOW (ref >60)
Glucose, Bld: 107 mg/dL — ABNORMAL HIGH (ref 70–99)
Potassium: 3.3 mmol/L — ABNORMAL LOW (ref 3.5–5.1)
Sodium: 137 mmol/L (ref 135–145)
Total Bilirubin: 2.4 mg/dL — ABNORMAL HIGH (ref 0.3–1.2)
Total Protein: 6.1 g/dL — ABNORMAL LOW (ref 6.5–8.1)

## 2021-03-02 LAB — PHOSPHORUS: Phosphorus: 3.5 mg/dL (ref 2.5–4.6)

## 2021-03-02 LAB — CBC
HCT: 35.4 % — ABNORMAL LOW (ref 39.0–52.0)
Hemoglobin: 11.9 g/dL — ABNORMAL LOW (ref 13.0–17.0)
MCH: 30.5 pg (ref 26.0–34.0)
MCHC: 33.6 g/dL (ref 30.0–36.0)
MCV: 90.8 fL (ref 80.0–100.0)
Platelets: 201 10*3/uL (ref 150–400)
RBC: 3.9 MIL/uL — ABNORMAL LOW (ref 4.22–5.81)
RDW: 13.5 % (ref 11.5–15.5)
WBC: 5.8 10*3/uL (ref 4.0–10.5)
nRBC: 0 % (ref 0.0–0.2)

## 2021-03-02 LAB — MAGNESIUM: Magnesium: 1.9 mg/dL (ref 1.7–2.4)

## 2021-03-02 MED ORDER — IOHEXOL 9 MG/ML PO SOLN
ORAL | Status: AC
  Filled 2021-03-02: qty 500

## 2021-03-02 MED ORDER — AMLODIPINE BESYLATE 5 MG PO TABS: 5.0000 mg | ORAL_TABLET | Freq: Every day | ORAL | Status: DC

## 2021-03-02 MED ORDER — MORPHINE SULFATE (PF) 2 MG/ML IV SOLN: 2.0000 mg | INTRAVENOUS | Status: DC | PRN

## 2021-03-02 MED ORDER — SODIUM CHLORIDE 0.9 % IV SOLN
INTRAVENOUS | Status: DC | PRN
  Administered 2021-03-02: 250 mL via INTRAVENOUS

## 2021-03-02 MED ORDER — IOHEXOL 9 MG/ML PO SOLN
500.0000 mL | ORAL | Status: AC
  Administered 2021-03-02: 500 mL via ORAL

## 2021-03-02 MED ORDER — TRAZODONE HCL 50 MG PO TABS
25.0000 mg | ORAL_TABLET | Freq: Every evening | ORAL | Status: DC | PRN
  Administered 2021-03-03 – 2021-03-05 (×3): 25 mg via ORAL
  Filled 2021-03-02 (×3): qty 1

## 2021-03-02 MED ORDER — POTASSIUM CHLORIDE CRYS ER 20 MEQ PO TBCR
40.0000 meq | EXTENDED_RELEASE_TABLET | Freq: Once | ORAL | Status: AC
  Administered 2021-03-02: 40 meq via ORAL
  Filled 2021-03-02: qty 2

## 2021-03-02 NOTE — Progress Notes (Signed)
[facility] night shift.  The nursing staff just reported that the patient's potassium level 3.3 mmol/L.  He is currently on heart healthy diet.  K-Lor 40 mEq p.o. x1 dose ordered.  Magnesium and phosphorus level added on.  Tennis Must, MD

## 2021-03-02 NOTE — Progress Notes (Signed)
   Subjective/Chief Complaint: Patient seems more alert this morning T. Bili down to 2.4.   Lipase improved, but not yet normal. No BM recorded yesterday Family is not at the bedside  Objective: Vital signs in last 24 hours: Temp:  [97.8 F (36.6 C)-98 F (36.7 C)] 98 F (36.7 C) (05/01 0403) Pulse Rate:  [60-63] 61 (05/01 0403) Resp:  [16-17] 16 (05/01 0403) BP: (113-150)/(61-82) 150/82 (05/01 0403) SpO2:  [95 %-98 %] 95 % (05/01 0403) Last BM Date: 02/28/21  Intake/Output from previous day: 04/30 0701 - 05/01 0700 In: 150 [P.O.:150] Out: 300 [Urine:300] Intake/Output this shift: No intake/output data recorded.  Elderly male - awake, alert;no complaints Sclera - non-icteric Abd - soft, mild epigastric tenderness; no palpable masses  Lab Results:  Recent Labs    03/01/21 0045 03/02/21 0134  WBC 9.1 5.8  HGB 12.5* 11.9*  HCT 37.3* 35.4*  PLT 218 201   BMET Recent Labs    03/01/21 0045 03/02/21 0134  NA 137 137  K 3.5 3.3*  CL 99 101  CO2 26 24  GLUCOSE 120* 107*  BUN 16 21  CREATININE 1.59* 1.60*  CALCIUM 8.9 8.8*   Hepatic Function Latest Ref Rng & Units 03/02/2021 03/01/2021 02/28/2021  Total Protein 6.5 - 8.1 g/dL 6.1(L) 6.5 6.3(L)  Albumin 3.5 - 5.0 g/dL 2.8(L) 3.0(L) 2.9(L)  AST 15 - 41 U/L 102(H) 94(H) 101(H)  ALT 0 - 44 U/L 129(H) 142(H) 169(H)  Alk Phosphatase 38 - 126 U/L 146(H) 159(H) 124  Total Bilirubin 0.3 - 1.2 mg/dL 2.4(H) 4.0(H) 4.4(H)    Lipase     Component Value Date/Time   LIPASE 85 (H) 03/02/2021 0134    PT/INR No results for input(s): LABPROT, INR in the last 72 hours. ABG No results for input(s): PHART, HCO3 in the last 72 hours.  Invalid input(s): PCO2, PO2  Studies/Results: No results found.  Anti-infectives: Anti-infectives (From admission, onward)   Start     Dose/Rate Route Frequency Ordered Stop   02/26/21 1000  piperacillin-tazobactam (ZOSYN) IVPB 3.375 g        3.375 g 12.5 mL/hr over 240 Minutes  Intravenous Every 8 hours 02/26/21 0456     02/26/21 0000  ceFEPIme (MAXIPIME) 2 g in sodium chloride 0.9 % 100 mL IVPB        2 g 200 mL/hr over 30 Minutes Intravenous  Once 02/25/21 2351 02/26/21 0405   02/26/21 0000  metroNIDAZOLE (FLAGYL) IVPB 500 mg        500 mg 100 mL/hr over 60 Minutes Intravenous  Once 02/25/21 2351 02/26/21 6967      Assessment/Plan: Biliary pancreatitis Lap chole canceled for today since T. Bili remains elevated at 4.  I have touched base with Dr. Henrene Pastor of GI and he feels that the patient still has some pancreatic edema that may be causing functional biliary obstruction.  In reviewing his labs, his T. Bili has fluctuated up and down.  1.6 on admission, then increased to 5.9, then slowly trended down to 2.4.  This may be due to resolving pancreatic edema. Will repeat CT scan without IV contrast today to evaluate pancreas prior to potential surgery. Will continue to hold Plavix - last dose was 4/26.   Lowfat diet as tolerated today.  NPO p midnight tonight for possible lap chole tomorrow.       LOS: 4 days    Leroy [name] 03/02/2021

## 2021-03-02 NOTE — Plan of Care (Signed)

## 2021-03-02 NOTE — Progress Notes (Signed)
PROGRESS NOTE    Leroy REIERSON  ZOX:[id number] DOB: 01/18/1930 DOA: 02/25/2021 PCP: Idelle Crouch, MD  Brief Narrative: 87 year old male with history of dementia, CKD 3B, hypertension who presented to the ER for evaluation of acute onset severe abdominal pain that started on 4/26. -In the ED he was noted to have lipase of 3570, abnormal LFTs, WBC of 10.6, CT abdomen pelvis concerning for distended gallbladder with wall thickening.  With Cholelithiasis -Admitted with acute gallstone pancreatitis  Assessment & Plan:  Acute gallstone pancreatitis Cholelithiasis, choledocholithiasis  -MRCP concerning for Choleithiasis, cannot rule out choledocholithiasis, GI suspects abrupt truncation of distal CBD could be secondary to pancreatic edema, recommended against ERCP -LFTs improving, bili slowly trending down -Fluids discontinued, remains on IV Zosyn, diet advanced -General surgery following -Bilirubin finally trending down, hopefully can have lap chole tomorrow, CMP in a.m.  CKD 3B -Creatinine close to baseline  Dementia -At risk of delirium, caution with sedating meds  History of CVA -Plavix on hold  Hypertension -Restarted Toprol, Imdur on hold  Hypothyroidism -Continue Synthroid  DVT prophylaxis: Lovenox Code Status: DO NOT RESUSCITATE Family Communication: Wife at bedside yesterday Disposition Plan:  Status is: Inpatient  Remains inpatient appropriate because:Inpatient level of care appropriate due to severity of illness   Dispo: The patient is from: Home              Anticipated d/c is to: Home              Patient currently is not medically stable to d/c.   Difficult to place patient No  Consultants:   General surgery  Gastroenterology   Procedures:   Antimicrobials:    Subjective: -Frustrated about still being in the hospital, denies any pain nausea or vomiting  Objective: Vitals:   03/01/21 2003 03/02/21 0403 03/02/21 0921 03/02/21 1343   BP: 113/61 (!) 150/82 124/77 (!) 182/86  Pulse: 63 61 63 67  Resp: 17 16 16 15   Temp: 97.8 F (36.6 C) 98 F (36.7 C) 98.2 F (36.8 C) 97.6 F (36.4 C)  TempSrc: Oral Oral Oral Oral  SpO2: 97% 95% 98% 99%  Weight:      Height:        Intake/Output Summary (Last 24 hours) at 03/02/2021 1353 Last data filed at 03/02/2021 0940 Gross per 24 hour  Intake 120 ml  Output --  Net 120 ml   Filed Weights   02/25/21 2043  Weight: 100.7 kg    Examination:  General exam: Pleasant elderly male sitting up in recliner, awake alert oriented to self and place, cognitive deficits noted CVS: S1-S2, regular rate rhythm Lungs: Clear anteriorly Abdomen: Soft, nontender, bowel sounds present Extremities: No edema  Psychiatry: Poor insight  Data Reviewed:   CBC: Recent Labs  Lab 02/25/21 2130 02/26/21 0248 02/27/21 0100 02/28/21 0512 03/01/21 0045 03/02/21 0134  WBC 10.6* 7.4 8.6 7.2 9.1 5.8  NEUTROABS 8.6*  --   --   --   --   --   HGB 12.1* 11.7* 11.8* 12.0* 12.5* 11.9*  HCT 37.0* 35.0* 35.1* 35.2* 37.3* 35.4*  MCV 92.5 91.4 89.5 89.1 90.3 90.8  PLT 265 226 214 196 218 811   Basic Metabolic Panel: Recent Labs  Lab 02/26/21 0248 02/27/21 0100 02/28/21 0512 03/01/21 0045 03/02/21 0134  NA 138 136 136 137 137  K 3.4* 3.4* 3.4* 3.5 3.3*  CL 103 102 100 99 101  CO2 24 24 25 26 24   GLUCOSE 142* 83 81 120*  107*  BUN 20 15 16 16 21   CREATININE 1.63* 1.42* 1.52* 1.59* 1.60*  CALCIUM 8.7* 8.5* 9.0 8.9 8.8*  MG  --   --   --   --  1.9  PHOS  --   --   --   --  3.5   GFR: Estimated Creatinine Clearance: 39.8 mL/min (A) (by C-G formula based on SCr of 1.6 mg/dL (H)). Liver Function Tests: Recent Labs  Lab 02/26/21 0248 02/27/21 0100 02/28/21 0512 03/01/21 0045 03/02/21 0134  AST 790* 246* 101* 94* 102*  ALT 355* 284* 169* 142* 129*  ALKPHOS 128* 126 124 159* 146*  BILITOT 2.3* 5.9* 4.4* 4.0* 2.4*  PROT 6.0* 6.1* 6.3* 6.5 6.1*  ALBUMIN 3.2* 2.9* 2.9* 3.0* 2.8*    Recent Labs  Lab 02/25/21 2130 02/27/21 0100 03/02/21 0134  LIPASE 3,570* 143* 85*   No results for input(s): AMMONIA in the last 168 hours. Coagulation Profile: No results for input(s): INR, PROTIME in the last 168 hours. Cardiac Enzymes: No results for input(s): CKTOTAL, CKMB, CKMBINDEX, TROPONINI in the last 168 hours. BNP (last 3 results) No results for input(s): PROBNP in the last 8760 hours. HbA1C: No results for input(s): HGBA1C in the last 72 hours. CBG: No results for input(s): GLUCAP in the last 168 hours. Lipid Profile: No results for input(s): CHOL, HDL, LDLCALC, TRIG, CHOLHDL, LDLDIRECT in the last 72 hours. Thyroid Function Tests: No results for input(s): TSH, T4TOTAL, FREET4, T3FREE, THYROIDAB in the last 72 hours. Anemia Panel: No results for input(s): VITAMINB12, FOLATE, FERRITIN, TIBC, IRON, RETICCTPCT in the last 72 hours. Urine analysis:    Component Value Date/Time   COLORURINE YELLOW 02/25/2021 2045   APPEARANCEUR CLEAR 02/25/2021 2045   LABSPEC 1.012 02/25/2021 2045   PHURINE 6.0 02/25/2021 2045   GLUCOSEU NEGATIVE 02/25/2021 2045   HGBUR MODERATE (A) 02/25/2021 2045   BILIRUBINUR NEGATIVE 02/25/2021 2045   [place] 02/25/2021 2045   PROTEINUR 30 (A) 02/25/2021 2045   NITRITE NEGATIVE 02/25/2021 2045   LEUKOCYTESUR NEGATIVE 02/25/2021 2045   Sepsis Labs: @LABRCNTIP (procalcitonin:4,lacticidven:4)  ) Recent Results (from the past 240 hour(s))  Resp Panel by RT-PCR (Flu A&B, Covid) Nasopharyngeal Swab     Status: None   Collection Time: 02/25/21 11:44 PM   Specimen: Nasopharyngeal Swab; Nasopharyngeal(NP) swabs in vial transport medium  Result Value Ref Range Status   SARS Coronavirus 2 by RT PCR NEGATIVE NEGATIVE Final    Comment: (NOTE) SARS-CoV-2 target nucleic acids are NOT DETECTED.  The SARS-CoV-2 RNA is generally detectable in upper respiratory specimens during the acute phase of infection. The lowest concentration of  SARS-CoV-2 viral copies this assay can detect is 138 copies/mL. A negative result does not preclude SARS-Cov-2 infection and should not be used as the sole basis for treatment or other patient management decisions. A negative result may occur with  improper specimen collection/handling, submission of specimen other than nasopharyngeal swab, presence of viral mutation(s) within the areas targeted by this assay, and inadequate number of viral copies(<138 copies/mL). A negative result must be combined with clinical observations, patient history, and epidemiological information. The expected result is Negative.  Fact Sheet for Patients:  EntrepreneurPulse.com.au  Fact Sheet for Healthcare Providers:  IncredibleEmployment.be  This test is no t yet approved or cleared by the [place] FDA and  has been authorized for detection and/or diagnosis of SARS-CoV-2 by FDA under an Emergency Use Authorization (EUA). This EUA will remain  in effect (meaning this test can be used)  for the duration of the COVID-19 declaration under Section 564(b)(1) of the Act, 21 U.S.C.section 360bbb-3(b)(1), unless the authorization is terminated  or revoked sooner.       Influenza A by PCR NEGATIVE NEGATIVE Final   Influenza B by PCR NEGATIVE NEGATIVE Final    Comment: (NOTE) The Xpert Xpress SARS-CoV-2/FLU/RSV plus assay is intended as an aid in the diagnosis of influenza from Nasopharyngeal swab specimens and should not be used as a sole basis for treatment. Nasal washings and aspirates are unacceptable for Xpert Xpress SARS-CoV-2/FLU/RSV testing.  Fact Sheet for Patients: EntrepreneurPulse.com.au  Fact Sheet for Healthcare Providers: IncredibleEmployment.be  This test is not yet approved or cleared by the [place] FDA and has been authorized for detection and/or diagnosis of SARS-CoV-2 by FDA under an Emergency Use  Authorization (EUA). This EUA will remain in effect (meaning this test can be used) for the duration of the COVID-19 declaration under Section 564(b)(1) of the Act, 21 U.S.C. section 360bbb-3(b)(1), unless the authorization is terminated or revoked.  Performed at [facility], [place] [address] [place], [place] [address]   Urine culture     Status: Abnormal   Collection Time: 02/26/21 12:15 AM   Specimen: Urine, Random  Result Value Ref Range Status   Specimen Description URINE, RANDOM  Final   Special Requests NONE  Final   Culture (A)  Final    <10,000 COLONIES/mL INSIGNIFICANT GROWTH Performed at [facility], [place] [address], [place], [place] [address]    Report Status 02/27/2021 FINAL  Final  Blood culture (routine x 2)     Status: None (Preliminary result)   Collection Time: 02/26/21 12:32 AM   Specimen: BLOOD RIGHT FOREARM  Result Value Ref Range Status   Specimen Description BLOOD RIGHT FOREARM  Final   Special Requests   Final    BOTTLES DRAWN AEROBIC AND ANAEROBIC Blood Culture results may not be optimal due to an inadequate volume of blood received in culture bottles   Culture   Final    NO GROWTH 3 DAYS Performed at [facility], [place] [address], [place], [place] [address]    Report Status PENDING  Incomplete  Blood culture (routine x 2)     Status: None (Preliminary result)   Collection Time: 02/26/21 12:40 AM   Specimen: BLOOD RIGHT HAND  Result Value Ref Range Status   Specimen Description BLOOD RIGHT HAND  Final   Special Requests   Final    BOTTLES DRAWN AEROBIC AND ANAEROBIC Blood Culture results may not be optimal due to an inadequate volume of blood received in culture bottles   Culture   Final    NO GROWTH 3 DAYS Performed at [facility], [place] [address] [place], [place] [address]    Report Status PENDING  Incomplete  Surgical pcr screen     Status: None   Collection Time: 02/26/21  9:12 PM   Specimen: Nasal Mucosa; Nasal Swab   Result Value Ref Range Status   MRSA, PCR NEGATIVE NEGATIVE Final   Staphylococcus aureus NEGATIVE NEGATIVE Final    Comment: (NOTE) The Xpert SA Assay (FDA approved for NASAL specimens in patients 74 years of age and older), is one component of a comprehensive surveillance program. It is not intended to diagnose infection nor to guide or monitor treatment. Performed at [facility], [place] [address] [place], [place] [address]      Scheduled Meds: . amLODipine  5 mg  Oral Daily  . enoxaparin (LOVENOX) injection  40 mg Subcutaneous Daily  . iohexol      . levothyroxine  50 mcg Oral Q0600  . metoprolol succinate  12.5 mg Oral Daily  . pantoprazole  40 mg Oral Q1200   Continuous Infusions: . piperacillin-tazobactam (ZOSYN)  IV 3.375 g (03/02/21 0552)     LOS: 4 days    Time spent: 71min  Domenic Polite, MD [facility] 03/02/2021, 1:53 PM

## 2021-03-03 ENCOUNTER — Encounter (HOSPITAL_COMMUNITY)
Admission: EM | Disposition: A | Discharge status: Home Health | Payer: Self-pay | Source: Home / Self Care | Attending: Internal Medicine

## 2021-03-03 ENCOUNTER — Inpatient Hospital Stay (HOSPITAL_COMMUNITY): Payer: Medicare Other | Admitting: Anesthesiology

## 2021-03-03 ENCOUNTER — Inpatient Hospital Stay (HOSPITAL_COMMUNITY): Payer: Medicare Other

## 2021-03-03 ENCOUNTER — Encounter (HOSPITAL_COMMUNITY): Payer: Self-pay | Admitting: Family Medicine

## 2021-03-03 DIAGNOSIS — K8050 Calculus of bile duct without cholangitis or cholecystitis without obstruction: Secondary | ICD-10-CM | POA: Diagnosis not present

## 2021-03-03 DIAGNOSIS — K851 Biliary acute pancreatitis without necrosis or infection: Secondary | ICD-10-CM | POA: Diagnosis not present

## 2021-03-03 DIAGNOSIS — R1084 Generalized abdominal pain: Secondary | ICD-10-CM

## 2021-03-03 HISTORY — PX: CHOLECYSTECTOMY: SHX55

## 2021-03-03 LAB — COMPREHENSIVE METABOLIC PANEL WITH GFR
ALT: 128 U/L — ABNORMAL HIGH (ref 0–44)
AST: 110 U/L — ABNORMAL HIGH (ref 15–41)
Albumin: 2.8 g/dL — ABNORMAL LOW (ref 3.5–5.0)
Alkaline Phosphatase: 140 U/L — ABNORMAL HIGH (ref 38–126)
Anion gap: 9 (ref 5–15)
BUN: 19 mg/dL (ref 8–23)
CO2: 26 mmol/L (ref 22–32)
Calcium: 9 mg/dL (ref 8.9–10.3)
Chloride: 101 mmol/L (ref 98–111)
Creatinine, Ser: 1.59 mg/dL — ABNORMAL HIGH (ref 0.61–1.24)
GFR, Estimated: 41 mL/min — ABNORMAL LOW
Glucose, Bld: 97 mg/dL (ref 70–99)
Potassium: 3.6 mmol/L (ref 3.5–5.1)
Sodium: 136 mmol/L (ref 135–145)
Total Bilirubin: 1.8 mg/dL — ABNORMAL HIGH (ref 0.3–1.2)
Total Protein: 6.2 g/dL — ABNORMAL LOW (ref 6.5–8.1)

## 2021-03-03 LAB — CULTURE, BLOOD (ROUTINE X 2)
Culture: NO GROWTH
Culture: NO GROWTH

## 2021-03-03 LAB — CBC
HCT: 35.8 % — ABNORMAL LOW (ref 39.0–52.0)
Hemoglobin: 12 g/dL — ABNORMAL LOW (ref 13.0–17.0)
MCH: 30 pg (ref 26.0–34.0)
MCHC: 33.5 g/dL (ref 30.0–36.0)
MCV: 89.5 fL (ref 80.0–100.0)
Platelets: 219 K/uL (ref 150–400)
RBC: 4 MIL/uL — ABNORMAL LOW (ref 4.22–5.81)
RDW: 13.2 % (ref 11.5–15.5)
WBC: 6 K/uL (ref 4.0–10.5)
nRBC: 0 % (ref 0.0–0.2)

## 2021-03-03 SURGERY — LAPAROSCOPIC CHOLECYSTECTOMY WITH INTRAOPERATIVE CHOLANGIOGRAM
Anesthesia: General | Site: Abdomen

## 2021-03-03 MED ORDER — PHENYLEPHRINE 40 MCG/ML (10ML) SYRINGE FOR IV PUSH (FOR BLOOD PRESSURE SUPPORT)
PREFILLED_SYRINGE | INTRAVENOUS | Status: AC
  Filled 2021-03-03: qty 10

## 2021-03-03 MED ORDER — GLUCAGON HCL RDNA (DIAGNOSTIC) 1 MG IJ SOLR
1.0000 mg | Freq: Once | INTRAMUSCULAR | Status: AC | PRN
  Administered 2021-03-03: 1 mg via INTRAVENOUS
  Filled 2021-03-03: qty 1

## 2021-03-03 MED ORDER — FENTANYL CITRATE (PF) 250 MCG/5ML IJ SOLN
INTRAMUSCULAR | Status: AC
  Filled 2021-03-03: qty 5

## 2021-03-03 MED ORDER — OXYCODONE HCL 5 MG/5ML PO SOLN: 5.0000 mg | Freq: Once | ORAL | Status: DC | PRN

## 2021-03-03 MED ORDER — GLUCAGON HCL RDNA (DIAGNOSTIC) 1 MG IJ SOLR
INTRAMUSCULAR | Status: AC
  Filled 2021-03-03: qty 1

## 2021-03-03 MED ORDER — DEXAMETHASONE SODIUM PHOSPHATE 10 MG/ML IJ SOLN
INTRAMUSCULAR | Status: DC | PRN
  Administered 2021-03-03: 5 mg via INTRAVENOUS

## 2021-03-03 MED ORDER — ACETAMINOPHEN 500 MG PO TABS
1000.0000 mg | ORAL_TABLET | Freq: Once | ORAL | Status: AC
  Administered 2021-03-03: 1000 mg via ORAL
  Filled 2021-03-03: qty 2

## 2021-03-03 MED ORDER — CHLORHEXIDINE GLUCONATE 0.12 % MT SOLN
OROMUCOSAL | Status: AC
  Administered 2021-03-03: 15 mL via OROMUCOSAL
  Filled 2021-03-03: qty 15

## 2021-03-03 MED ORDER — PROPOFOL 10 MG/ML IV BOLUS
INTRAVENOUS | Status: AC
  Filled 2021-03-03: qty 20

## 2021-03-03 MED ORDER — FENTANYL CITRATE (PF) 250 MCG/5ML IJ SOLN
INTRAMUSCULAR | Status: DC | PRN
  Administered 2021-03-03: 25 ug via INTRAVENOUS
  Administered 2021-03-03: 50 ug via INTRAVENOUS
  Administered 2021-03-03 (×2): 25 ug via INTRAVENOUS

## 2021-03-03 MED ORDER — SUGAMMADEX SODIUM 200 MG/2ML IV SOLN
INTRAVENOUS | Status: DC | PRN
  Administered 2021-03-03: 200 mg via INTRAVENOUS

## 2021-03-03 MED ORDER — PHENYLEPHRINE 40 MCG/ML (10ML) SYRINGE FOR IV PUSH (FOR BLOOD PRESSURE SUPPORT)
PREFILLED_SYRINGE | INTRAVENOUS | Status: DC | PRN
  Administered 2021-03-03: 80 ug via INTRAVENOUS

## 2021-03-03 MED ORDER — CHLORHEXIDINE GLUCONATE 0.12 % MT SOLN: 15.0000 mL | Freq: Once | OROMUCOSAL | Status: AC

## 2021-03-03 MED ORDER — PROPOFOL 10 MG/ML IV BOLUS
INTRAVENOUS | Status: DC | PRN
  Administered 2021-03-03: 140 mg via INTRAVENOUS

## 2021-03-03 MED ORDER — PROMETHAZINE HCL 25 MG/ML IJ SOLN: 6.2500 mg | INTRAMUSCULAR | Status: DC | PRN

## 2021-03-03 MED ORDER — 0.9 % SODIUM CHLORIDE (POUR BTL) OPTIME
TOPICAL | Status: DC | PRN
  Administered 2021-03-03: 1000 mL

## 2021-03-03 MED ORDER — ISOSORBIDE MONONITRATE ER 30 MG PO TB24
30.0000 mg | ORAL_TABLET | Freq: Every day | ORAL | Status: DC
  Administered 2021-03-04 – 2021-03-06 (×3): 30 mg via ORAL
  Filled 2021-03-03 (×3): qty 1

## 2021-03-03 MED ORDER — LACTATED RINGERS IV SOLN: INTRAVENOUS | Status: DC

## 2021-03-03 MED ORDER — LIDOCAINE 2% (20 MG/ML) 5 ML SYRINGE
INTRAMUSCULAR | Status: AC
  Filled 2021-03-03: qty 5

## 2021-03-03 MED ORDER — SODIUM CHLORIDE 0.9 % IR SOLN
Status: DC | PRN
  Administered 2021-03-03: 1000 mL

## 2021-03-03 MED ORDER — ROCURONIUM BROMIDE 10 MG/ML (PF) SYRINGE
PREFILLED_SYRINGE | INTRAVENOUS | Status: AC
  Filled 2021-03-03: qty 10

## 2021-03-03 MED ORDER — FENTANYL CITRATE (PF) 100 MCG/2ML IJ SOLN: 25.0000 ug | INTRAMUSCULAR | Status: DC | PRN

## 2021-03-03 MED ORDER — ONDANSETRON HCL 4 MG/2ML IJ SOLN
INTRAMUSCULAR | Status: AC
  Filled 2021-03-03: qty 2

## 2021-03-03 MED ORDER — BUPIVACAINE-EPINEPHRINE 0.25% -1:200000 IJ SOLN
INTRAMUSCULAR | Status: DC | PRN
  Administered 2021-03-03: 15 mL

## 2021-03-03 MED ORDER — ONDANSETRON HCL 4 MG/2ML IJ SOLN
INTRAMUSCULAR | Status: DC | PRN
  Administered 2021-03-03: 4 mg via INTRAVENOUS

## 2021-03-03 MED ORDER — LIDOCAINE 2% (20 MG/ML) 5 ML SYRINGE
INTRAMUSCULAR | Status: DC | PRN
  Administered 2021-03-03: 100 mg via INTRAVENOUS

## 2021-03-03 MED ORDER — BUPIVACAINE-EPINEPHRINE (PF) 0.25% -1:200000 IJ SOLN
INTRAMUSCULAR | Status: AC
  Filled 2021-03-03: qty 30

## 2021-03-03 MED ORDER — SODIUM CHLORIDE 0.9 % IV SOLN
INTRAVENOUS | Status: DC | PRN
  Administered 2021-03-03: 20 mL

## 2021-03-03 MED ORDER — DEXAMETHASONE SODIUM PHOSPHATE 10 MG/ML IJ SOLN
INTRAMUSCULAR | Status: AC
  Filled 2021-03-03: qty 1

## 2021-03-03 MED ORDER — ROCURONIUM BROMIDE 10 MG/ML (PF) SYRINGE
PREFILLED_SYRINGE | INTRAVENOUS | Status: DC | PRN
  Administered 2021-03-03 (×2): 20 mg via INTRAVENOUS
  Administered 2021-03-03: 60 mg via INTRAVENOUS

## 2021-03-03 MED ORDER — OXYCODONE HCL 5 MG PO TABS: 5.0000 mg | ORAL_TABLET | Freq: Once | ORAL | Status: DC | PRN

## 2021-03-03 MED ORDER — ORAL CARE MOUTH RINSE: 15.0000 mL | Freq: Once | OROMUCOSAL | Status: AC

## 2021-03-03 SURGICAL SUPPLY — 41 items
APPLIER CLIP 5 13 M/L LIGAMAX5 (MISCELLANEOUS) ×2
BLADE CLIPPER SURG (BLADE) IMPLANT
CANISTER SUCT 3000ML PPV (MISCELLANEOUS) ×2 IMPLANT
CATH DILATATION ATB (CATHETERS) ×2 IMPLANT
CHLORAPREP W/TINT 26 (MISCELLANEOUS) ×2 IMPLANT
CLIP APPLIE 5 13 M/L LIGAMAX5 (MISCELLANEOUS) ×1 IMPLANT
COVER MAYO STAND STRL (DRAPES) ×2 IMPLANT
COVER SURGICAL LIGHT HANDLE (MISCELLANEOUS) ×2 IMPLANT
DERMABOND ADVANCED (GAUZE/BANDAGES/DRESSINGS) ×1
DERMABOND ADVANCED .7 DNX12 (GAUZE/BANDAGES/DRESSINGS) ×1 IMPLANT
DISSECTOR BLUNT TIP ENDO 5MM (MISCELLANEOUS) ×2 IMPLANT
DRAPE C-ARM 42X120 X-RAY (DRAPES) ×2 IMPLANT
ELECT REM PT RETURN 9FT ADLT (ELECTROSURGICAL) ×2
ELECTRODE REM PT RTRN 9FT ADLT (ELECTROSURGICAL) ×1 IMPLANT
ENDOLOOP SUT PDS II  0 18 (SUTURE) ×1
ENDOLOOP SUT PDS II 0 18 (SUTURE) ×1 IMPLANT
GLOVE BIO SURGEON STRL SZ 6.5 (GLOVE) ×2 IMPLANT
GLOVE BIOGEL PI IND STRL 6 (GLOVE) ×1 IMPLANT
GLOVE BIOGEL PI INDICATOR 6 (GLOVE) ×1
GOWN STRL REUS W/ TWL LRG LVL3 (GOWN DISPOSABLE) ×3 IMPLANT
GOWN STRL REUS W/TWL LRG LVL3 (GOWN DISPOSABLE) ×3
KIT BASIN OR (CUSTOM PROCEDURE TRAY) ×2 IMPLANT
KIT TURNOVER KIT B (KITS) ×2 IMPLANT
NS IRRIG 1000ML POUR BTL (IV SOLUTION) ×2 IMPLANT
PAD ARMBOARD 7.5X6 YLW CONV (MISCELLANEOUS) ×2 IMPLANT
POUCH SPECIMEN RETRIEVAL 10MM (ENDOMECHANICALS) ×2 IMPLANT
SCISSORS LAP 5X35 DISP (ENDOMECHANICALS) ×2 IMPLANT
SET CHOLANGIOGRAPH 5 50 .035 (SET/KITS/TRAYS/PACK) ×2 IMPLANT
SET IRRIG TUBING LAPAROSCOPIC (IRRIGATION / IRRIGATOR) ×2 IMPLANT
SET TUBE SMOKE EVAC HIGH FLOW (TUBING) ×2 IMPLANT
SLEEVE ENDOPATH XCEL 5M (ENDOMECHANICALS) ×4 IMPLANT
SPECIMEN JAR SMALL (MISCELLANEOUS) ×2 IMPLANT
SUT MNCRL AB 4-0 PS2 18 (SUTURE) ×2 IMPLANT
SUT VIC AB 0 UR5 27 (SUTURE) ×2 IMPLANT
SUT VICRYL 0 UR6 27IN ABS (SUTURE) IMPLANT
TOWEL GREEN STERILE (TOWEL DISPOSABLE) ×2 IMPLANT
TOWEL GREEN STERILE FF (TOWEL DISPOSABLE) ×2 IMPLANT
TRAY LAPAROSCOPIC MC (CUSTOM PROCEDURE TRAY) ×2 IMPLANT
TROCAR XCEL BLUNT TIP 100MML (ENDOMECHANICALS) ×2 IMPLANT
TROCAR XCEL NON-BLD 5MMX100MML (ENDOMECHANICALS) ×2 IMPLANT
WATER STERILE IRR 1000ML POUR (IV SOLUTION) ×2 IMPLANT

## 2021-03-03 NOTE — Progress Notes (Signed)
PROGRESS NOTE    Leroy [name]  DDU:[id number] DOB: Oct 23, 1930 DOA: 02/25/2021 PCP: Idelle Crouch, MD  Brief Narrative: 87 year old male with history of dementia, CKD 3B, hypertension who presented to the ER for evaluation of acute onset severe abdominal pain that started on 4/26. -In the ED he was noted to have lipase of 3570, abnormal LFTs, WBC of 10.6, CT abdomen pelvis concerning for distended gallbladder with wall thickening.  With Cholelithiasis -Admitted with acute gallstone pancreatitis  Assessment & Plan:  Acute gallstone pancreatitis Cholelithiasis, choledocholithiasis  -MRCP concerning for Choleithiasis, cannot rule out choledocholithiasis, GI suspects abrupt truncation of distal CBD could be secondary to pancreatic edema, recommended against ERCP -LFTs improving, bili slowly trending down -Fluids discontinued, remains on IV Zosyn, diet advanced, discontinue antibiotics after today's dose -General surgery following, bili trending down -Lap chole with IOC today, filling defect in CBD  CKD 3B -Stable and close to baseline  Dementia -At risk of delirium, caution with sedating meds  History of CVA -Plavix on hold  Hypertension -Restarted Toprol -Restart Imdur from tomorrow, labetalol PRN  Hypothyroidism -Continue Synthroid  DVT prophylaxis: Lovenox Code Status: DO NOT RESUSCITATE Family Communication: Disposition Plan:  Status is: Inpatient  Remains inpatient appropriate because:Inpatient level of care appropriate due to severity of illness   Dispo: The patient is from: Home              Anticipated d/c is to: Home              Patient currently is not medically stable to d/c.   Difficult to place patient No  Consultants:   General surgery  Gastroenterology   Procedures: Lap chole with IOC, Dr. Bobbye Morton 5/2  Antimicrobials:    Subjective: -Pt seen in PACU, drowsy but arousable  Objective: Vitals:   03/02/21 2030 03/03/21 1410 03/03/21  1425 03/03/21 1439  BP: (!) 162/85 (!) 160/104 (!) 192/91 (!) 178/89  Pulse: 64 79 74 73  Resp: 16 15 19 16   Temp: 97.9 F (36.6 C) (!) 97.5 F (36.4 C)  97.8 F (36.6 C)  TempSrc:      SpO2: 95% 99% 98% 94%  Weight:      Height:        Intake/Output Summary (Last 24 hours) at 03/03/2021 1508 Last data filed at 03/03/2021 1405 Gross per 24 hour  Intake 1185.66 ml  Output 150 ml  Net 1035.66 ml   Filed Weights   02/25/21 2043  Weight: 100.7 kg    Examination:  General exam: Pleasant elderly male laying in bed, somnolent but arousable  CVS: S1-S2, regular rate rhythm Lungs: Poor air movement anteriorly Abdomen: Soft, mildly distended, tender as expected, bowel sounds decreased, port sites noted Extremities: No edema  Psychiatry: Poor insight  Data Reviewed:   CBC: Recent Labs  Lab 02/25/21 2130 02/26/21 0248 02/27/21 0100 02/28/21 0512 03/01/21 0045 03/02/21 0134 03/03/21 0031  WBC 10.6*   < > 8.6 7.2 9.1 5.8 6.0  NEUTROABS 8.6*  --   --   --   --   --   --   HGB 12.1*   < > 11.8* 12.0* 12.5* 11.9* 12.0*  HCT 37.0*   < > 35.1* 35.2* 37.3* 35.4* 35.8*  MCV 92.5   < > 89.5 89.1 90.3 90.8 89.5  PLT 265   < > 214 196 218 201 219   < > = values in this interval not displayed.   Basic Metabolic Panel: Recent Labs  Lab 02/27/21  0100 02/28/21 0512 03/01/21 0045 03/02/21 0134 03/03/21 0031  NA 136 136 137 137 136  K 3.4* 3.4* 3.5 3.3* 3.6  CL 102 100 99 101 101  CO2 24 25 26 24 26   GLUCOSE 83 81 120* 107* 97  BUN 15 16 16 21 19   CREATININE 1.42* 1.52* 1.59* 1.60* 1.59*  CALCIUM 8.5* 9.0 8.9 8.8* 9.0  MG  --   --   --  1.9  --   PHOS  --   --   --  3.5  --    GFR: Estimated Creatinine Clearance: 40.1 mL/min (A) (by C-G formula based on SCr of 1.59 mg/dL (H)). Liver Function Tests: Recent Labs  Lab 02/27/21 0100 02/28/21 0512 03/01/21 0045 03/02/21 0134 03/03/21 0031  AST 246* 101* 94* 102* 110*  ALT 284* 169* 142* 129* 128*  ALKPHOS 126 124 159*  146* 140*  BILITOT 5.9* 4.4* 4.0* 2.4* 1.8*  PROT 6.1* 6.3* 6.5 6.1* 6.2*  ALBUMIN 2.9* 2.9* 3.0* 2.8* 2.8*   Recent Labs  Lab 02/25/21 2130 02/27/21 0100 03/02/21 0134  LIPASE 3,570* 143* 85*   No results for input(s): AMMONIA in the last 168 hours. Coagulation Profile: No results for input(s): INR, PROTIME in the last 168 hours. Cardiac Enzymes: No results for input(s): CKTOTAL, CKMB, CKMBINDEX, TROPONINI in the last 168 hours. BNP (last 3 results) No results for input(s): PROBNP in the last 8760 hours. HbA1C: No results for input(s): HGBA1C in the last 72 hours. CBG: No results for input(s): GLUCAP in the last 168 hours. Lipid Profile: No results for input(s): CHOL, HDL, LDLCALC, TRIG, CHOLHDL, LDLDIRECT in the last 72 hours. Thyroid Function Tests: No results for input(s): TSH, T4TOTAL, FREET4, T3FREE, THYROIDAB in the last 72 hours. Anemia Panel: No results for input(s): VITAMINB12, FOLATE, FERRITIN, TIBC, IRON, RETICCTPCT in the last 72 hours. Urine analysis:    Component Value Date/Time   COLORURINE YELLOW 02/25/2021 2045   APPEARANCEUR CLEAR 02/25/2021 2045   LABSPEC 1.012 02/25/2021 2045   PHURINE 6.0 02/25/2021 2045   GLUCOSEU NEGATIVE 02/25/2021 2045   HGBUR MODERATE (A) 02/25/2021 2045   BILIRUBINUR NEGATIVE 02/25/2021 2045   [place] 02/25/2021 2045   PROTEINUR 30 (A) 02/25/2021 2045   NITRITE NEGATIVE 02/25/2021 2045   LEUKOCYTESUR NEGATIVE 02/25/2021 2045   Sepsis Labs: @LABRCNTIP (procalcitonin:4,lacticidven:4)  ) Recent Results (from the past 240 hour(s))  Resp Panel by RT-PCR (Flu A&B, Covid) Nasopharyngeal Swab     Status: None   Collection Time: 02/25/21 11:44 PM   Specimen: Nasopharyngeal Swab; Nasopharyngeal(NP) swabs in vial transport medium  Result Value Ref Range Status   SARS Coronavirus 2 by RT PCR NEGATIVE NEGATIVE Final    Comment: (NOTE) SARS-CoV-2 target nucleic acids are NOT DETECTED.  The SARS-CoV-2 RNA is generally  detectable in upper respiratory specimens during the acute phase of infection. The lowest concentration of SARS-CoV-2 viral copies this assay can detect is 138 copies/mL. A negative result does not preclude SARS-Cov-2 infection and should not be used as the sole basis for treatment or other patient management decisions. A negative result may occur with  improper specimen collection/handling, submission of specimen other than nasopharyngeal swab, presence of viral mutation(s) within the areas targeted by this assay, and inadequate number of viral copies(<138 copies/mL). A negative result must be combined with clinical observations, patient history, and epidemiological information. The expected result is Negative.  Fact Sheet for Patients:  EntrepreneurPulse.com.au  Fact Sheet for Healthcare Providers:  IncredibleEmployment.be  This test is  no t yet approved or cleared by the [place] and  has been authorized for detection and/or diagnosis of SARS-CoV-2 by FDA under an Emergency Use Authorization (EUA). This EUA will remain  in effect (meaning this test can be used) for the duration of the COVID-19 declaration under Section 564(b)(1) of the Act, 21 U.S.C.section 360bbb-3(b)(1), unless the authorization is terminated  or revoked sooner.       Influenza A by PCR NEGATIVE NEGATIVE Final   Influenza B by PCR NEGATIVE NEGATIVE Final    Comment: (NOTE) The Xpert Xpress SARS-CoV-2/FLU/RSV plus assay is intended as an aid in the diagnosis of influenza from Nasopharyngeal swab specimens and should not be used as a sole basis for treatment. Nasal washings and aspirates are unacceptable for Xpert Xpress SARS-CoV-2/FLU/RSV testing.  Fact Sheet for Patients: EntrepreneurPulse.com.au  Fact Sheet for Healthcare Providers: IncredibleEmployment.be  This test is not yet approved or cleared by the [place] FDA  and has been authorized for detection and/or diagnosis of SARS-CoV-2 by FDA under an Emergency Use Authorization (EUA). This EUA will remain in effect (meaning this test can be used) for the duration of the COVID-19 declaration under Section 564(b)(1) of the Act, 21 U.S.C. section 360bbb-3(b)(1), unless the authorization is terminated or revoked.  Performed at [facility], [place] [address], [place], [place] [address]   Urine culture     Status: Abnormal   Collection Time: 02/26/21 12:15 AM   Specimen: Urine, Random  Result Value Ref Range Status   Specimen Description URINE, RANDOM  Final   Special Requests NONE  Final   Culture (A)  Final    <10,000 COLONIES/mL INSIGNIFICANT GROWTH Performed at [facility], [place] [address] [place], [place] [address]    Report Status 02/27/2021 FINAL  Final  Blood culture (routine x 2)     Status: None   Collection Time: 02/26/21 12:32 AM   Specimen: BLOOD RIGHT FOREARM  Result Value Ref Range Status   Specimen Description BLOOD RIGHT FOREARM  Final   Special Requests   Final    BOTTLES DRAWN AEROBIC AND ANAEROBIC Blood Culture results may not be optimal due to an inadequate volume of blood received in culture bottles   Culture   Final    NO GROWTH 5 DAYS Performed at [facility], [place] [address], [place], [place] [address]    Report Status 03/03/2021 FINAL  Final  Blood culture (routine x 2)     Status: None   Collection Time: 02/26/21 12:40 AM   Specimen: BLOOD RIGHT HAND  Result Value Ref Range Status   Specimen Description BLOOD RIGHT HAND  Final   Special Requests   Final    BOTTLES DRAWN AEROBIC AND ANAEROBIC Blood Culture results may not be optimal due to an inadequate volume of blood received in culture bottles   Culture   Final    NO GROWTH 5 DAYS Performed at [facility], [place] [address], [place], [place] [address]    Report Status 03/03/2021 FINAL  Final  Surgical pcr screen     Status: None    Collection Time: 02/26/21  9:12 PM   Specimen: Nasal Mucosa; Nasal Swab  Result Value Ref Range Status   MRSA, PCR NEGATIVE NEGATIVE Final   Staphylococcus aureus NEGATIVE NEGATIVE Final    Comment: (NOTE) The Xpert SA Assay (FDA approved for NASAL specimens in patients 64 years of age and older), is one component of a  comprehensive surveillance program. It is not intended to diagnose infection nor to guide or monitor treatment. Performed at [facility], [place] [address], [place], [place] [address]      Scheduled Meds: . enoxaparin (LOVENOX) injection  40 mg Subcutaneous Daily  . levothyroxine  50 mcg Oral Q0600  . metoprolol succinate  12.5 mg Oral Daily  . pantoprazole  40 mg Oral Q1200   Continuous Infusions: . sodium chloride 10 mL/hr at 03/03/21 0337  . piperacillin-tazobactam (ZOSYN)  IV 3.375 g (03/03/21 [id number])     LOS: 5 days    Time spent: 7min  Domenic Polite, MD [facility] 03/03/2021, 3:08 PM

## 2021-03-03 NOTE — Anesthesia Procedure Notes (Signed)
Procedure Name: Intubation Date/Time: 03/03/2021 11:49 AM Performed by: Georgia Duff, CRNA Pre-anesthesia Checklist: Patient identified, Emergency Drugs available, Suction available and Patient being monitored Patient Re-evaluated:Patient Re-evaluated prior to induction Oxygen Delivery Method: Circle System Utilized Preoxygenation: Pre-oxygenation with 100% oxygen Induction Type: IV induction Ventilation: Mask ventilation without difficulty Laryngoscope Size: Miller and 3 Grade View: Grade I Tube type: Oral Tube size: 7.5 mm Number of attempts: 1 Airway Equipment and Method: Stylet and Oral airway Placement Confirmation: ETT inserted through vocal cords under direct vision,  positive ETCO2 and breath sounds checked- equal and bilateral Secured at: 22 cm Tube secured with: Tape Dental Injury: Teeth and Oropharynx as per pre-operative assessment

## 2021-03-03 NOTE — Consult Note (Signed)
Daily Rounding Note  03/03/2021, 2:36 PM  LOS: 5 days   SUBJECTIVE:   Chief complaint: Filling defect on intraoperative cholangiogram.  See original GI consult note dated 4/27.  Evaluated for acute biliary pancreatitis.  No obvious CBD stone on MRCP, so no plan for ERCP at that time.  After Plavix washout, patient underwent lap chole with IOC today.  The [place] shows a filling defect and GI requested reinvolved for ERCP.   LFTs have steadily trended down. T bili 5.9 >> 1.8.  Alk phos 128 >> 159 >> 140.  AST/ALT 790/355  >> 94/142  >> 110/128.  Lipase 3570 >> 85.  OBJECTIVE:         Vital signs in last 24 hours:    Temp:  [97.5 F (36.4 C)-97.9 F (36.6 C)] 97.5 F (36.4 C) (05/02 1410) Pulse Rate:  [64-79] 74 (05/02 1425) Resp:  [15-19] 19 (05/02 1425) BP: (157-192)/(85-104) 192/91 (05/02 1425) SpO2:  [95 %-99 %] 98 % (05/02 1425) Last BM Date: 02/28/21 Filed Weights   02/25/21 2043  Weight: 100.7 kg   General: Somewhat alert, still recovering from sedation.  NAD Heart: RRR Chest: CTA Abdomen: Soft, appropriate mild postoperative TTP  Intake/Output from previous day: 05/01 0701 - 05/02 0700 In: 455.7 [P.O.:120; I.V.:12.2; IV Piggyback:323.4] Out: -   Intake/Output this shift: Total I/O In: 850 [I.V.:850] Out: 150 [Blood:150]  Lab Results: Recent Labs    03/01/21 0045 03/02/21 0134 03/03/21 0031  WBC 9.1 5.8 6.0  HGB 12.5* 11.9* 12.0*  HCT 37.3* 35.4* 35.8*  PLT 218 201 219   BMET Recent Labs    03/01/21 0045 03/02/21 0134 03/03/21 0031  NA 137 137 136  K 3.5 3.3* 3.6  CL 99 101 101  CO2 $Re'26 24 26  'hnr$ GLUCOSE 120* 107* 97  BUN $Re'16 21 19  'RCV$ CREATININE 1.59* 1.60* 1.59*  CALCIUM 8.9 8.8* 9.0   LFT Recent Labs    03/01/21 0045 03/02/21 0134 03/03/21 0031  PROT 6.5 6.1* 6.2*  ALBUMIN 3.0* 2.8* 2.8*  AST 94* 102* 110*  ALT 142* 129* 128*  ALKPHOS 159* 146* 140*  BILITOT 4.0* 2.4* 1.8*    PT/INR No results for input(s): LABPROT, INR in the last 72 hours. Hepatitis Panel No results for input(s): HEPBSAG, HCVAB, HEPAIGM, HEPBIGM in the last 72 hours.  Studies/Results: CT ABDOMEN PELVIS WO CONTRAST  Result Date: 03/02/2021 CLINICAL DATA:  87 year old male for follow-up of biliary pancreatitis and cholelithiasis/choledocholithiasis. EXAM: CT ABDOMEN AND PELVIS WITHOUT CONTRAST TECHNIQUE: Multidetector CT imaging of the abdomen and pelvis was performed following the standard protocol without IV contrast. COMPARISON:  02/26/2021 MRCP, 02/25/2021 CT and prior studies FINDINGS: Please note that parenchymal abnormalities may be missed without intravenous contrast. Lower chest: Mild bibasilar atelectasis/scarring again noted with trace RIGHT pleural effusion. Hepatobiliary: No hepatic abnormalities are identified. The gallbladder is less distended with continued wall thickening and possible mild adjacent stranding. No biliary dilatation identified. Pancreas: Unremarkable. No peripancreatic inflammation is identified. Spleen: Unremarkable Adrenals/Urinary Tract: The kidneys, adrenal glands and bladder are unremarkable. Stomach/Bowel: No bowel wall thickening, inflammatory changes or bowel obstruction identified. Vascular/Lymphatic: Aortic atherosclerotic calcifications again noted. Mildly prominent periportal lymph nodes appear unchanged from remote studies. Reproductive: Prostate enlargement again noted. Other: A moderate to large LEFT inguinal hernia containing a portion of colon is again identified, but without bowel wall thickening or bowel obstruction. No ascites, pneumoperitoneum or focal collection noted. Musculoskeletal: No acute or suspicious bony  abnormalities are identified. Degenerative changes in the lumbar spine again noted. IMPRESSION: 1. Decreased gallbladder distension with continued apparent gallbladder wall thickening and mild adjacent stranding. No biliary dilatation. 2.  Unremarkable appearing pancreas. No evidence of peripancreatic inflammation. 3. Unchanged moderate to large LEFT inguinal hernia containing a portion of colon. No evidence of bowel wall thickening or bowel obstruction. 4. Aortic Atherosclerosis (ICD10-I70.0). Electronically Signed   By: Margarette Canada M.D.   On: 03/02/2021 15:17   DG Cholangiogram Operative  Result Date: 03/03/2021 CLINICAL DATA:  Intraoperative cholangiogram during laparoscopic cholecystectomy. EXAM: INTRAOPERATIVE CHOLANGIOGRAM FLUOROSCOPY TIME:  15 seconds (6 mGy) COMPARISON:  Intraoperative cholangiogram-earlier same day; MRCP-02/26/2021 FINDINGS: Repeat intraoperative cholangiographic images of the right upper abdominal quadrant during laparoscopic cholecystectomy are provided for review. Surgical clips overlie the expected location of the gallbladder fossa. Contrast injection demonstrates selective cannulation of the central aspect of the cystic duct though there is minimal extravasation of contrast about the cystic duct cannulation site. There is passage of contrast through the central aspect of the cystic duct with filling of a moderately dilated common bile duct. Contrast from preceding intraoperative cholangiogram is seen within the duodenum. There is minimal passage of contrast through the distal aspect of the CBD with persistent lentiform filling defect within the distal aspect of the CBD potentially representative nonocclusive choledocholithiasis. There is minimal reflux of injected contrast into the common hepatic duct and central aspect of the non dilated intrahepatic biliary system. IMPRESSION: Nonocclusive lentiform filling defect within the distal aspect of the CBD, nonspecific though suggestive of nonocclusive choledocholithiasis. Further evaluation and management with ERCP could be performed as indicated. Electronically Signed   By: Sandi Mariscal M.D.   On: 03/03/2021 13:47   DG Cholangiogram Operative  Result Date:  03/03/2021 CLINICAL DATA:  Choledocholithiasis and gallbladder wall thickening on MRCP EXAM: INTRAOPERATIVE CHOLANGIOGRAM TECHNIQUE: Cholangiographic images from the C-arm fluoroscopic device were submitted for interpretation post-operatively. Please see the procedural report for the amount of contrast and the fluoroscopy time utilized. COMPARISON:  MRCP 02/26/2021 FINDINGS: Mobile filling defects in the distal common duct. Intrahepatic ducts are incompletely visualized, appearing decompressed centrally. Minimal contrast passes into the duodenum. : Probable incompletely/intermittently occlusive distal choledocholithiasis. I telephoned the test results to Dr. Bobbye Morton at the time of interpretation. Electronically Signed   By: Lucrezia Europe M.D.   On: 03/03/2021 13:22    ASSESSMENT AND PLAN:   1) Biliary pancreatitis 2) Choledocholithiasis  CBD filling defect on intraoperative cholangiogram today.  Otherwise hemodynamically stable. - Plan for ERCP tomorrow with Dr. Rush Landmark - N.p.o. at midnight  3) History of CVA 4) History of PVD 5) History of CVA 6) History of dementia - Continue holding Plavix for ERCP tomorrow  The indications, risks, and benefits of ERCP were explained to the patient and his 2 family members at bedside in detail today. Risks include but are not limited to bleeding, perforation, adverse reaction to medications, pancreatitis, inability to cannulate duct, and cardiopulmonary compromise. Sequelae include but are not limited to the possibility of surgery, prolonged hospitalization, and mortality. The patient and his family members verbalized understanding and wish to proceed.    Gerrit Heck, DO, [facility]

## 2021-03-03 NOTE — Op Note (Signed)
   Operative Note  Date: 03/03/2021  Procedure: laparoscopic cholecystectomy, intraoperative cholangiogram  Pre-op diagnosis: gallstone pancreatitis Post-op diagnosis: same  Indication and clinical history: The patient is a 87 y.o. year old male with gallstone pancreatitis  Surgeon: Jesusita Oka, MD Assistant: Grandville Silos, MD  Anesthesiologist: Daiva Huge, MD Anesthesia: General  Findings:  . Specimen: gallbladder . EBL: 25cc . Drains/Implants: none  Disposition: PACU - hemodynamically stable.  Description of procedure: The patient was positioned supine on the operating room table. Time-out was performed verifying correct patient, procedure, signature of informed consent, and administration of pre-operative antibiotics. General anesthetic induction and intubation were uneventful. The abdomen was prepped and draped in the usual sterile fashion. An infra-umbilical incision was made using an open technique using zero vicryl stay sutures on either side of the fascia and a 35mm Hassan port inserted. After establishing pneumoperitoneum, which the patient tolerated well, the abdominal cavity was inspected and no injury of any intra-abdominal structures was identified. Additional ports were placed under direct visualization and using local anesthetic: two 23mm ports in the right subcostal region and a 17mm port in the epigastric region. The patient was re-positioned to reverse Trendelenburg and right side up. Adhesiolysis was performed to expose the gallbladder, which was then retracted cephalad. The infundibulum was identified and retracted toward the right lower quadrant. The peritoneum was incised over the infundibulum and the triangle of Calot dissected to expose the critical view of safety with an anterior and posterior cystic artery branch. A clip was placed on the cystic duct close to the neck of the gallbladder. A nick was made in the cystic duct and a cholangiogram catheter threaded. A  cholangiogram was obtained and showed an intact biliary tree, a filling defect at the distal common bile duct and essentially no visible flow of contrast into the duodenum. One milligram of glucagon was administered and the duct flushed multiple times. The cholangiogram was repeated and still showed a filling defect. A cholangiogram balloon catheter was inserted and a laparoscopic common bile duct exploration attempted, but the catheter was unable to be passed distally, so this was aborted. The cystic duct was then doubly clipped and divided followed by endoloop of the cystic duct stump. The anterior and posterior cystic artery branches were also doubly clipped and divided. The gallbladder was dissected off the liver bed using electrocautery and hemostasis of the liver bed was confirmed prior to separation of the final peritoneal attachments of the gallbladder to the liver bed. The gallbladder fossa was irrigated and fluid returned clear. After transection of the final peritoneal attachments, the gallbladder was placed in an endoscopic specimen retrieval bag, removed via the umbilical port site, and sent to pathology as a specimen. The gallbladder fossa was inspected confirming hemostasis, the absence of bile leakage from the cystic duct stump, and correct placement of clips on the cystic artery and cystic duct stumps. The abdomen was desufflated and the fascia of the umbilical port site was closed using the previously placed stay sutures. Additional local anesthetic was administered at the umbilical port site.  The skin of all incisions was closed with 4-0 monocryl. Sterile dressings were applied. All sponge and instrument counts were correct at the conclusion of the procedure. The patient was awakened from anesthesia, extubated uneventfully, and transported to the PACU - hemodynamically stable.. There were no complications.    Jesusita Oka, MD General and [facility]

## 2021-03-03 NOTE — Anesthesia Postprocedure Evaluation (Signed)
Anesthesia Post Note  Patient: Leroy [name]  Procedure(s) Performed: LAPAROSCOPIC CHOLECYSTECTOMY WITH INTRAOPERATIVE CHOLANGIOGRAM (N/A Abdomen)     Patient location during evaluation: PACU Anesthesia Type: General Level of consciousness: awake and alert and oriented Pain management: pain level controlled Vital Signs Assessment: post-procedure vital signs reviewed and stable Respiratory status: spontaneous breathing, nonlabored ventilation and respiratory function stable Cardiovascular status: blood pressure returned to baseline Postop Assessment: no apparent nausea or vomiting Anesthetic complications: no   No complications documented.  Last Vitals:  Vitals:   03/03/21 1425 03/03/21 1439  BP: (!) 192/91 (!) 178/89  Pulse: 74 73  Resp: 19 16  Temp:  36.6 C  SpO2: 98% 94%    Last Pain:  Vitals:   03/03/21 1439  TempSrc:   PainSc: 0-No pain                 Brennan Bailey

## 2021-03-03 NOTE — Anesthesia Preprocedure Evaluation (Addendum)
Anesthesia Evaluation  Patient identified by MRN, date of birth, ID band Patient awake    Reviewed: Allergy & Precautions, NPO status , Patient's Chart, lab work & pertinent test results, reviewed documented beta blocker date and time   History of Anesthesia Complications Negative for: history of anesthetic complications  Airway Mallampati: II  TM Distance: >3 FB Neck ROM: Full    Dental  (+) Missing,    Pulmonary neg pulmonary ROS,    Pulmonary exam normal        Cardiovascular hypertension, Pt. on home beta blockers and Pt. on medications + Peripheral Vascular Disease  Normal cardiovascular exam  TTE 03/2020: EF 60-65%, moderate asymmetric LVH of the basal-septal segment, grade I DD   Neuro/Psych Dementia CVA (2017)    GI/Hepatic Neg liver ROS, hiatal hernia, PUD, pancreatitis   Endo/Other  Hypothyroidism   Renal/GU Renal InsufficiencyRenal disease  negative genitourinary   Musculoskeletal negative musculoskeletal ROS (+)   Abdominal   Peds  Hematology  (+) anemia , Hgb 12.0   Anesthesia Other Findings Day of surgery medications reviewed with patient.  Reproductive/Obstetrics negative OB ROS                            Anesthesia Physical Anesthesia Plan  ASA: III  Anesthesia Plan: General   Post-op Pain Management:    Induction: Intravenous  PONV Risk Score and Plan: 3 and Treatment may vary due to age or medical condition, Ondansetron and Dexamethasone  Airway Management Planned: Oral ETT  Additional Equipment: None  Intra-op Plan:   Post-operative Plan: Extubation in OR  Informed Consent: I have reviewed the patients History and Physical, chart, labs and discussed the procedure including the risks, benefits and alternatives for the proposed anesthesia with the patient or authorized representative who has indicated his/her understanding and acceptance.   Patient has DNR.   Discussed DNR with patient, Discussed DNR with power of attorney and Suspend DNR.   Dental advisory given and Consent reviewed with POA  Plan Discussed with: CRNA  Anesthesia Plan Comments: (DNR status discussed with patient and family (wife and daughter) at bedside, all wish to suspend DNR perioperatively. Daiva Huge, MD)       Anesthesia Quick Evaluation

## 2021-03-03 NOTE — Care Management Important Message (Signed)
Important Message  Patient Details  Name: Leroy [name] MRN: [id number] Date of Birth: 02/24/1930   Medicare Important Message Given:  Yes     Orbie Pyo 03/03/2021, 4:36 PM

## 2021-03-03 NOTE — Transfer of Care (Signed)
Immediate Anesthesia Transfer of Care Note  Patient: Leroy [name]  Procedure(s) Performed: LAPAROSCOPIC CHOLECYSTECTOMY WITH INTRAOPERATIVE CHOLANGIOGRAM (N/A Abdomen)  Patient Location: PACU  Anesthesia Type:General  Level of Consciousness: drowsy  Airway & Oxygen Therapy: Patient Spontanous Breathing and Patient connected to nasal cannula oxygen  Post-op Assessment: Report given to RN, Post -op Vital signs reviewed and stable and Patient moving all extremities  Post vital signs: Reviewed and stable  Last Vitals:  Vitals Value Taken Time  BP 160/104 03/03/21 1410  Temp 36.4 C 03/03/21 1410  Pulse 77 03/03/21 1414  Resp 19 03/03/21 1414  SpO2 97 % 03/03/21 1414  Vitals shown include unvalidated device data.  Last Pain:  Vitals:   03/03/21 1039  TempSrc:   PainSc: 0-No pain         Complications: No complications documented.

## 2021-03-04 ENCOUNTER — Encounter (HOSPITAL_COMMUNITY): Payer: Self-pay | Admitting: Surgery

## 2021-03-04 DIAGNOSIS — K851 Biliary acute pancreatitis without necrosis or infection: Secondary | ICD-10-CM | POA: Diagnosis not present

## 2021-03-04 DIAGNOSIS — K8050 Calculus of bile duct without cholangitis or cholecystitis without obstruction: Secondary | ICD-10-CM | POA: Diagnosis not present

## 2021-03-04 LAB — COMPREHENSIVE METABOLIC PANEL
ALT: 185 U/L — ABNORMAL HIGH (ref 0–44)
AST: 185 U/L — ABNORMAL HIGH (ref 15–41)
Albumin: 3 g/dL — ABNORMAL LOW (ref 3.5–5.0)
Alkaline Phosphatase: 151 U/L — ABNORMAL HIGH (ref 38–126)
Anion gap: 10 (ref 5–15)
BUN: 19 mg/dL (ref 8–23)
CO2: 25 mmol/L (ref 22–32)
Calcium: 8.8 mg/dL — ABNORMAL LOW (ref 8.9–10.3)
Chloride: 99 mmol/L (ref 98–111)
Creatinine, Ser: 1.56 mg/dL — ABNORMAL HIGH (ref 0.61–1.24)
GFR, Estimated: 42 mL/min — ABNORMAL LOW (ref >60)
Glucose, Bld: 164 mg/dL — ABNORMAL HIGH (ref 70–99)
Potassium: 3.7 mmol/L (ref 3.5–5.1)
Sodium: 134 mmol/L — ABNORMAL LOW (ref 135–145)
Total Bilirubin: 1.7 mg/dL — ABNORMAL HIGH (ref 0.3–1.2)
Total Protein: 6.4 g/dL — ABNORMAL LOW (ref 6.5–8.1)

## 2021-03-04 LAB — CBC
HCT: 36.2 % — ABNORMAL LOW (ref 39.0–52.0)
Hemoglobin: 12.3 g/dL — ABNORMAL LOW (ref 13.0–17.0)
MCH: 30.2 pg (ref 26.0–34.0)
MCHC: 34 g/dL (ref 30.0–36.0)
MCV: 88.9 fL (ref 80.0–100.0)
Platelets: 243 10*3/uL (ref 150–400)
RBC: 4.07 MIL/uL — ABNORMAL LOW (ref 4.22–5.81)
RDW: 13.2 % (ref 11.5–15.5)
WBC: 11 10*3/uL — ABNORMAL HIGH (ref 4.0–10.5)
nRBC: 0 % (ref 0.0–0.2)

## 2021-03-04 LAB — SURGICAL PATHOLOGY

## 2021-03-04 LAB — LIPASE, BLOOD: Lipase: 51 U/L (ref 11–51)

## 2021-03-04 NOTE — H&P (View-Only) (Signed)
Attending physician's note   I have taken an interval history, reviewed the chart and examined the patient. I agree with the Advanced Practitioner's note, impression, and recommendations as outlined.   Tolerating p.o. intake without any abdominal pain.  Family does report decreased urine output.  Defer whether or not to do bladder scan or other evaluation to primary Hospital service.  Otherwise, plan for ERCP tomorrow with Dr. Mansouraty.   Trestan Vahle, DO, FACG [phone] office                                                                      Daily Rounding Note  03/04/2021, 11:22 AM  LOS: 6 days   SUBJECTIVE:   Chief complaint: Choledocholithiasis     Patient not complaining of abdominal pain.  Tolerating p.o. Wife upset because the ERCP is rescheduled for 3 PM tomorrow.  Yesterday she was told it would be at 1:00 today. The patient has dementia and gets agitated so wife wants to get him out of the hospital ASAP.  OBJECTIVE:         Vital signs in last 24 hours:    Temp:  [97.5 F (36.4 C)-98.3 F (36.8 C)] 98.3 F (36.8 C) (05/03 0516) Pulse Rate:  [73-91] 81 (05/03 0516) Resp:  [15-19] 17 (05/03 0225) BP: (124-192)/(75-104) 137/76 (05/03 0516) SpO2:  [94 %-99 %] 98 % (05/03 0516) Last BM Date: 02/28/21 Filed Weights   02/25/21 2043  Weight: 100.7 kg   General: NAD Heart: RRR Chest: No labored breathing Abdomen: Soft without tenderness.  Glued surgical scars intact. Extremities: No CCE. Neuro/Psych: Follows commands.  Not fully oriented.  Intake/Output from previous day: 05/02 0701 - 05/03 0700 In: 1270 [P.O.:420; I.V.:850] Out: 900 [Urine:750; Blood:150]  Intake/Output this shift: No intake/output data recorded.  Lab Results: Recent Labs    03/02/21 0134 03/03/21 0031 03/04/21 0211  WBC 5.8 6.0 11.0*  HGB 11.9* 12.0* 12.3*  HCT 35.4* 35.8* 36.2*  PLT 201 219 243   BMET Recent Labs    03/02/21 0134 03/03/21 0031 03/04/21 0211  NA  137 136 134*  K 3.3* 3.6 3.7  CL 101 101 99  CO2 24 26 25  GLUCOSE 107* 97 164*  BUN 21 19 19  CREATININE 1.60* 1.59* 1.56*  CALCIUM 8.8* 9.0 8.8*   LFT Recent Labs    03/02/21 0134 03/03/21 0031 03/04/21 0211  PROT 6.1* 6.2* 6.4*  ALBUMIN 2.8* 2.8* 3.0*  AST 102* 110* 185*  ALT 129* 128* 185*  ALKPHOS 146* 140* 151*  BILITOT 2.4* 1.8* 1.7*   PT/INR No results for input(s): LABPROT, INR in the last 72 hours. Hepatitis Panel No results for input(s): HEPBSAG, HCVAB, HEPAIGM, HEPBIGM in the last 72 hours.  Studies/Results: CT ABDOMEN PELVIS WO CONTRAST  Result Date: 03/02/2021 CLINICAL DATA:  87-year-old male for follow-up of biliary pancreatitis and cholelithiasis/choledocholithiasis. EXAM: CT ABDOMEN AND PELVIS WITHOUT CONTRAST TECHNIQUE: Multidetector CT imaging of the abdomen and pelvis was performed following the standard protocol without IV contrast. COMPARISON:  02/26/2021 MRCP, 02/25/2021 CT and prior studies FINDINGS: Please note that parenchymal abnormalities may be missed without intravenous contrast. Lower chest: Mild bibasilar atelectasis/scarring again noted with trace RIGHT pleural effusion. Hepatobiliary: No hepatic abnormalities are identified.   The gallbladder is less distended with continued wall thickening and possible mild adjacent stranding. No biliary dilatation identified. Pancreas: Unremarkable. No peripancreatic inflammation is identified. Spleen: Unremarkable Adrenals/Urinary Tract: The kidneys, adrenal glands and bladder are unremarkable. Stomach/Bowel: No bowel wall thickening, inflammatory changes or bowel obstruction identified. Vascular/Lymphatic: Aortic atherosclerotic calcifications again noted. Mildly prominent periportal lymph nodes appear unchanged from remote studies. Reproductive: Prostate enlargement again noted. Other: A moderate to large LEFT inguinal hernia containing a portion of colon is again identified, but without bowel wall thickening or bowel  obstruction. No ascites, pneumoperitoneum or focal collection noted. Musculoskeletal: No acute or suspicious bony abnormalities are identified. Degenerative changes in the lumbar spine again noted. IMPRESSION: 1. Decreased gallbladder distension with continued apparent gallbladder wall thickening and mild adjacent stranding. No biliary dilatation. 2. Unremarkable appearing pancreas. No evidence of peripancreatic inflammation. 3. Unchanged moderate to large LEFT inguinal hernia containing a portion of colon. No evidence of bowel wall thickening or bowel obstruction. 4. Aortic Atherosclerosis (ICD10-I70.0). Electronically Signed   By: Jeffrey  Hu M.D.   On: 03/02/2021 15:17   DG Cholangiogram Operative  Result Date: 03/03/2021 CLINICAL DATA:  Intraoperative cholangiogram during laparoscopic cholecystectomy. EXAM: INTRAOPERATIVE CHOLANGIOGRAM FLUOROSCOPY TIME:  15 seconds (6 mGy) COMPARISON:  Intraoperative cholangiogram-earlier same day; MRCP-02/26/2021 FINDINGS: Repeat intraoperative cholangiographic images of the right upper abdominal quadrant during laparoscopic cholecystectomy are provided for review. Surgical clips overlie the expected location of the gallbladder fossa. Contrast injection demonstrates selective cannulation of the central aspect of the cystic duct though there is minimal extravasation of contrast about the cystic duct cannulation site. There is passage of contrast through the central aspect of the cystic duct with filling of a moderately dilated common bile duct. Contrast from preceding intraoperative cholangiogram is seen within the duodenum. There is minimal passage of contrast through the distal aspect of the CBD with persistent lentiform filling defect within the distal aspect of the CBD potentially representative nonocclusive choledocholithiasis. There is minimal reflux of injected contrast into the common hepatic duct and central aspect of the non dilated intrahepatic biliary system.  IMPRESSION: Nonocclusive lentiform filling defect within the distal aspect of the CBD, nonspecific though suggestive of nonocclusive choledocholithiasis. Further evaluation and management with ERCP could be performed as indicated. Electronically Signed   By: John  Watts M.D.   On: 03/03/2021 13:47   DG Cholangiogram Operative  Result Date: 03/03/2021 CLINICAL DATA:  Choledocholithiasis and gallbladder wall thickening on MRCP EXAM: INTRAOPERATIVE CHOLANGIOGRAM TECHNIQUE: Cholangiographic images from the C-arm fluoroscopic device were submitted for interpretation post-operatively. Please see the procedural report for the amount of contrast and the fluoroscopy time utilized. COMPARISON:  MRCP 02/26/2021 FINDINGS: Mobile filling defects in the distal common duct. Intrahepatic ducts are incompletely visualized, appearing decompressed centrally. Minimal contrast passes into the duodenum. : Probable incompletely/intermittently occlusive distal choledocholithiasis. I telephoned the test results to Dr. Lovick at the time of interpretation. Electronically Signed   By: D  Hassell M.D.   On: 03/03/2021 13:22    ASSESMENT:   *     choledocholithiasis on IOC at time of lap chole 5/2.  *     biliary pancreatitis   PLAN   *    set up for ERCP tomorrow at 3 PM with Dr. Mansouraty  *    allow heart healthy diet today.  Clear liquid breakfast in the morning and n.p.o. after 9 AM for the afternoon procedure. Stop Lovenox after today.  *    Continues on Zosyn.       Azucena Freed  03/04/2021, 11:22 AM Phone [phone]

## 2021-03-04 NOTE — Progress Notes (Signed)
PROGRESS NOTE    Leroy [name]  TOI:[id number] DOB: [birth year] DOA: 02/25/2021 PCP: Idelle Crouch, MD  Brief Narrative: 87 year old male with history of dementia, CKD 3B, hypertension who presented to the ER for evaluation of acute onset severe abdominal pain that started on 4/26. -In the ED he was noted to have lipase of 3570, abnormal LFTs, WBC of 10.6, CT abdomen pelvis concerning for distended gallbladder with wall thickening.  With Cholelithiasis -Admitted with acute gallstone pancreatitis  Assessment & Plan:  Acute gallstone pancreatitis Cholelithiasis, choledocholithiasis  -MRCP concerning for Choleithiasis, cannot rule out choledocholithiasis, GI consulted, Dr. Henrene Pastor felt abrupt truncation of distal CBD was likely from pancreatic edema -Treated with bowel rest, supportive care and IV Zosyn which was discontinued after 7 days -General surgery following, bili trending down -Lap chole with IOC today, filling defect in CBD -GI reconsulted, plan for ERCP  CKD 3B -Stable and close to baseline  Dementia -At risk of delirium, caution with sedating meds  History of CVA -Plavix on hold  Hypertension -Restarted Toprol -Restart Imdur , labetalol as needed  Hypothyroidism -Continue Synthroid  DVT prophylaxis: Hold Lovenox for ERCP Code Status: DO NOT RESUSCITATE Family Communication: Daughter at bedside Disposition Plan:  Status is: Inpatient  Remains inpatient appropriate because:Inpatient level of care appropriate due to severity of illness   Dispo: The patient is from: Home              Anticipated d/c is to: Home              Patient currently is not medically stable to d/c.   Difficult to place patient No  Consultants:   General surgery  Gastroenterology   Procedures: Lap chole with IOC, Dr. Bobbye Morton 5/2  Antimicrobials:    Subjective: -Patient seen in room, daughter at bedside tells me that ERCP will be this afternoon  Objective: Vitals:    03/03/21 1439 03/03/21 1948 03/04/21 0225 03/04/21 0516  BP: (!) 178/89 (!) 163/89 124/75 137/76  Pulse: 73 85 91 81  Resp: 16 18 17    Temp: 97.8 F (36.6 C) 97.6 F (36.4 C) 97.8 F (36.6 C) 98.3 F (36.8 C)  TempSrc:  Oral Oral Oral  SpO2: 94% 98% 95% 98%  Weight:      Height:        Intake/Output Summary (Last 24 hours) at 03/04/2021 1526 Last data filed at 03/03/2021 1827 Gross per 24 hour  Intake 420 ml  Output 750 ml  Net -330 ml   Filed Weights   02/25/21 2043  Weight: 100.7 kg    Examination:  General exam: Pleasant elderly male laying in bed, awake alert oriented to self and place, mild cognitive deficits CVS: S1-S2, regular rate rhythm Lungs: Poor air movement anteriorly otherwise clear Abdomen: Soft, mildly distended, tender at the port sites as expected, bowel sounds present but decreased Extremities: No edema Psychiatry: Poor insight  Data Reviewed:   CBC: Recent Labs  Lab 02/25/21 2130 02/26/21 0248 02/28/21 0512 03/01/21 0045 03/02/21 0134 03/03/21 0031 03/04/21 0211  WBC 10.6*   < > 7.2 9.1 5.8 6.0 11.0*  NEUTROABS 8.6*  --   --   --   --   --   --   HGB 12.1*   < > 12.0* 12.5* 11.9* 12.0* 12.3*  HCT 37.0*   < > 35.2* 37.3* 35.4* 35.8* 36.2*  MCV 92.5   < > 89.1 90.3 90.8 89.5 88.9  PLT 265   < > 196 218  201 219 243   < > = values in this interval not displayed.   Basic Metabolic Panel: Recent Labs  Lab 02/28/21 0512 03/01/21 0045 03/02/21 0134 03/03/21 0031 03/04/21 0211  NA 136 137 137 136 134*  K 3.4* 3.5 3.3* 3.6 3.7  CL 100 99 101 101 99  CO2 25 26 24 26 25   GLUCOSE 81 120* 107* 97 164*  BUN 16 16 21 19 19   CREATININE 1.52* 1.59* 1.60* 1.59* 1.56*  CALCIUM 9.0 8.9 8.8* 9.0 8.8*  MG  --   --  1.9  --   --   PHOS  --   --  3.5  --   --    GFR: Estimated Creatinine Clearance: 40.8 mL/min (A) (by C-G formula based on SCr of 1.56 mg/dL (H)). Liver Function Tests: Recent Labs  Lab 02/28/21 0512 03/01/21 0045 03/02/21 0134  03/03/21 0031 03/04/21 0211  AST 101* 94* 102* 110* 185*  ALT 169* 142* 129* 128* 185*  ALKPHOS 124 159* 146* 140* 151*  BILITOT 4.4* 4.0* 2.4* 1.8* 1.7*  PROT 6.3* 6.5 6.1* 6.2* 6.4*  ALBUMIN 2.9* 3.0* 2.8* 2.8* 3.0*   Recent Labs  Lab 02/25/21 2130 02/27/21 0100 03/02/21 0134 03/04/21 0211  LIPASE 3,570* 143* 85* 51   No results for input(s): AMMONIA in the last 168 hours. Coagulation Profile: No results for input(s): INR, PROTIME in the last 168 hours. Cardiac Enzymes: No results for input(s): CKTOTAL, CKMB, CKMBINDEX, TROPONINI in the last 168 hours. BNP (last 3 results) No results for input(s): PROBNP in the last 8760 hours. HbA1C: No results for input(s): HGBA1C in the last 72 hours. CBG: No results for input(s): GLUCAP in the last 168 hours. Lipid Profile: No results for input(s): CHOL, HDL, LDLCALC, TRIG, CHOLHDL, LDLDIRECT in the last 72 hours. Thyroid Function Tests: No results for input(s): TSH, T4TOTAL, FREET4, T3FREE, THYROIDAB in the last 72 hours. Anemia Panel: No results for input(s): VITAMINB12, FOLATE, FERRITIN, TIBC, IRON, RETICCTPCT in the last 72 hours. Urine analysis:    Component Value Date/Time   COLORURINE YELLOW 02/25/2021 2045   APPEARANCEUR CLEAR 02/25/2021 2045   LABSPEC 1.012 02/25/2021 2045   PHURINE 6.0 02/25/2021 2045   GLUCOSEU NEGATIVE 02/25/2021 2045   HGBUR MODERATE (A) 02/25/2021 2045   BILIRUBINUR NEGATIVE 02/25/2021 2045   [place] 02/25/2021 2045   PROTEINUR 30 (A) 02/25/2021 2045   NITRITE NEGATIVE 02/25/2021 2045   LEUKOCYTESUR NEGATIVE 02/25/2021 2045   Sepsis Labs: @LABRCNTIP (procalcitonin:4,lacticidven:4)  ) Recent Results (from the past 240 hour(s))  Resp Panel by RT-PCR (Flu A&B, Covid) Nasopharyngeal Swab     Status: None   Collection Time: 02/25/21 11:44 PM   Specimen: Nasopharyngeal Swab; Nasopharyngeal(NP) swabs in vial transport medium  Result Value Ref Range Status   SARS Coronavirus 2 by RT PCR  NEGATIVE NEGATIVE Final    Comment: (NOTE) SARS-CoV-2 target nucleic acids are NOT DETECTED.  The SARS-CoV-2 RNA is generally detectable in upper respiratory specimens during the acute phase of infection. The lowest concentration of SARS-CoV-2 viral copies this assay can detect is 138 copies/mL. A negative result does not preclude SARS-Cov-2 infection and should not be used as the sole basis for treatment or other patient management decisions. A negative result may occur with  improper specimen collection/handling, submission of specimen other than nasopharyngeal swab, presence of viral mutation(s) within the areas targeted by this assay, and inadequate number of viral copies(<138 copies/mL). A negative result must be combined with clinical observations, patient history,  and epidemiological information. The expected result is Negative.  Fact Sheet for Patients:  EntrepreneurPulse.com.au  Fact Sheet for Healthcare Providers:  IncredibleEmployment.be  This test is no t yet approved or cleared by the [place] FDA and  has been authorized for detection and/or diagnosis of SARS-CoV-2 by FDA under an Emergency Use Authorization (EUA). This EUA will remain  in effect (meaning this test can be used) for the duration of the COVID-19 declaration under Section 564(b)(1) of the Act, 21 U.S.C.section 360bbb-3(b)(1), unless the authorization is terminated  or revoked sooner.       Influenza A by PCR NEGATIVE NEGATIVE Final   Influenza B by PCR NEGATIVE NEGATIVE Final    Comment: (NOTE) The Xpert Xpress SARS-CoV-2/FLU/RSV plus assay is intended as an aid in the diagnosis of influenza from Nasopharyngeal swab specimens and should not be used as a sole basis for treatment. Nasal washings and aspirates are unacceptable for Xpert Xpress SARS-CoV-2/FLU/RSV testing.  Fact Sheet for Patients: EntrepreneurPulse.com.au  Fact Sheet for  Healthcare Providers: IncredibleEmployment.be  This test is not yet approved or cleared by the [place] FDA and has been authorized for detection and/or diagnosis of SARS-CoV-2 by FDA under an Emergency Use Authorization (EUA). This EUA will remain in effect (meaning this test can be used) for the duration of the COVID-19 declaration under Section 564(b)(1) of the Act, 21 U.S.C. section 360bbb-3(b)(1), unless the authorization is terminated or revoked.  Performed at [facility], [place] [address] [place], [place] [address]   Urine culture     Status: Abnormal   Collection Time: 02/26/21 12:15 AM   Specimen: Urine, Random  Result Value Ref Range Status   Specimen Description URINE, RANDOM  Final   Special Requests NONE  Final   Culture (A)  Final    <10,000 COLONIES/mL INSIGNIFICANT GROWTH Performed at [facility], [place] [address] [place], [place] [address]    Report Status 02/27/2021 FINAL  Final  Blood culture (routine x 2)     Status: None   Collection Time: 02/26/21 12:32 AM   Specimen: BLOOD RIGHT FOREARM  Result Value Ref Range Status   Specimen Description BLOOD RIGHT FOREARM  Final   Special Requests   Final    BOTTLES DRAWN AEROBIC AND ANAEROBIC Blood Culture results may not be optimal due to an inadequate volume of blood received in culture bottles   Culture   Final    NO GROWTH 5 DAYS Performed at [facility], [place] [address], [place], [place] [address]    Report Status 03/03/2021 FINAL  Final  Blood culture (routine x 2)     Status: None   Collection Time: 02/26/21 12:40 AM   Specimen: BLOOD RIGHT HAND  Result Value Ref Range Status   Specimen Description BLOOD RIGHT HAND  Final   Special Requests   Final    BOTTLES DRAWN AEROBIC AND ANAEROBIC Blood Culture results may not be optimal due to an inadequate volume of blood received in culture bottles   Culture   Final    NO GROWTH 5 DAYS Performed at [place], [place] [address], [place], [place] [address]    Report Status 03/03/2021 FINAL  Final  Surgical pcr screen     Status: None   Collection Time: 02/26/21  9:12 PM   Specimen: Nasal Mucosa; Nasal Swab  Result Value Ref Range Status   MRSA, PCR NEGATIVE NEGATIVE Final   Staphylococcus aureus NEGATIVE NEGATIVE Final  Comment: (NOTE) The Xpert SA Assay (FDA approved for NASAL specimens in patients 76 years of age and older), is one component of a comprehensive surveillance program. It is not intended to diagnose infection nor to guide or monitor treatment. Performed at [facility], [place] [address], [place], [place] [address]      Scheduled Meds: . enoxaparin (LOVENOX) injection  40 mg Subcutaneous Daily  . isosorbide mononitrate  30 mg Oral Daily  . levothyroxine  50 mcg Oral Q0600  . metoprolol succinate  12.5 mg Oral Daily  . pantoprazole  40 mg Oral Q1200   Continuous Infusions: . sodium chloride 10 mL/hr at 03/03/21 0337  . piperacillin-tazobactam (ZOSYN)  IV 3.375 g (03/04/21 1330)     LOS: 6 days    Time spent: 69min  Domenic Polite, MD [facility] 03/04/2021, 3:26 PM

## 2021-03-04 NOTE — Progress Notes (Signed)
Patient put back on heart healthy diet due to procedure date change, now scheduled for tomorrow at 1500. Daughter updated in room. We were able to get the patient from the bed and into the recliner with the stedy. Patient in recliner eating lunch tray.

## 2021-03-04 NOTE — Progress Notes (Addendum)
Attending physician's note   I have taken an interval history, reviewed the chart and examined the patient. I agree with the Advanced Practitioner's note, impression, and recommendations as outlined.   Tolerating p.o. intake without any abdominal pain.  Family does report decreased urine output.  Defer whether or not to do bladder scan or other evaluation to primary Hospital service.  Otherwise, plan for ERCP tomorrow with Dr. Rush Landmark.   [place], DO, FACG [phone] office                                                                      Daily Rounding Note  03/04/2021, 11:22 AM  LOS: 6 days   SUBJECTIVE:   Chief complaint: Choledocholithiasis     Patient not complaining of abdominal pain.  Tolerating p.o. Wife upset because the ERCP is rescheduled for 3 PM tomorrow.  Yesterday she was told it would be at 1:00 today. The patient has dementia and gets agitated so wife wants to get him out of the hospital ASAP.  OBJECTIVE:         Vital signs in last 24 hours:    Temp:  [97.5 F (36.4 C)-98.3 F (36.8 C)] 98.3 F (36.8 C) (05/03 0516) Pulse Rate:  [73-91] 81 (05/03 0516) Resp:  [15-19] 17 (05/03 0225) BP: (124-192)/(75-104) 137/76 (05/03 0516) SpO2:  [94 %-99 %] 98 % (05/03 0516) Last BM Date: 02/28/21 Filed Weights   02/25/21 2043  Weight: 100.7 kg   General: NAD Heart: RRR Chest: No labored breathing Abdomen: Soft without tenderness.  Glued surgical scars intact. Extremities: No CCE. Neuro/Psych: Follows commands.  Not fully oriented.  Intake/Output from previous day: 05/02 0701 - 05/03 0700 In: 1270 [P.O.:420; I.V.:850] Out: 900 [Urine:750; Blood:150]  Intake/Output this shift: No intake/output data recorded.  Lab Results: Recent Labs    03/02/21 0134 03/03/21 0031 03/04/21 0211  WBC 5.8 6.0 11.0*  HGB 11.9* 12.0* 12.3*  HCT 35.4* 35.8* 36.2*  PLT 201 219 243   BMET Recent Labs    03/02/21 0134 03/03/21 0031 03/04/21 0211  NA  137 136 134*  K 3.3* 3.6 3.7  CL 101 101 99  CO2 24 26 25   GLUCOSE 107* 97 164*  BUN 21 19 19   CREATININE 1.60* 1.59* 1.56*  CALCIUM 8.8* 9.0 8.8*   LFT Recent Labs    03/02/21 0134 03/03/21 0031 03/04/21 0211  PROT 6.1* 6.2* 6.4*  ALBUMIN 2.8* 2.8* 3.0*  AST 102* 110* 185*  ALT 129* 128* 185*  ALKPHOS 146* 140* 151*  BILITOT 2.4* 1.8* 1.7*   PT/INR No results for input(s): LABPROT, INR in the last 72 hours. Hepatitis Panel No results for input(s): HEPBSAG, HCVAB, HEPAIGM, HEPBIGM in the last 72 hours.  Studies/Results: CT ABDOMEN PELVIS WO CONTRAST  Result Date: 03/02/2021 CLINICAL DATA:  87 year old male for follow-up of biliary pancreatitis and cholelithiasis/choledocholithiasis. EXAM: CT ABDOMEN AND PELVIS WITHOUT CONTRAST TECHNIQUE: Multidetector CT imaging of the abdomen and pelvis was performed following the standard protocol without IV contrast. COMPARISON:  02/26/2021 MRCP, 02/25/2021 CT and prior studies FINDINGS: Please note that parenchymal abnormalities may be missed without intravenous contrast. Lower chest: Mild bibasilar atelectasis/scarring again noted with trace RIGHT pleural effusion. Hepatobiliary: No hepatic abnormalities are identified.  The gallbladder is less distended with continued wall thickening and possible mild adjacent stranding. No biliary dilatation identified. Pancreas: Unremarkable. No peripancreatic inflammation is identified. Spleen: Unremarkable Adrenals/Urinary Tract: The kidneys, adrenal glands and bladder are unremarkable. Stomach/Bowel: No bowel wall thickening, inflammatory changes or bowel obstruction identified. Vascular/Lymphatic: Aortic atherosclerotic calcifications again noted. Mildly prominent periportal lymph nodes appear unchanged from remote studies. Reproductive: Prostate enlargement again noted. Other: A moderate to large LEFT inguinal hernia containing a portion of colon is again identified, but without bowel wall thickening or bowel  obstruction. No ascites, pneumoperitoneum or focal collection noted. Musculoskeletal: No acute or suspicious bony abnormalities are identified. Degenerative changes in the lumbar spine again noted. IMPRESSION: 1. Decreased gallbladder distension with continued apparent gallbladder wall thickening and mild adjacent stranding. No biliary dilatation. 2. Unremarkable appearing pancreas. No evidence of peripancreatic inflammation. 3. Unchanged moderate to large LEFT inguinal hernia containing a portion of colon. No evidence of bowel wall thickening or bowel obstruction. 4. Aortic Atherosclerosis (ICD10-I70.0). Electronically Signed   By: Margarette Canada M.D.   On: 03/02/2021 15:17   DG Cholangiogram Operative  Result Date: 03/03/2021 CLINICAL DATA:  Intraoperative cholangiogram during laparoscopic cholecystectomy. EXAM: INTRAOPERATIVE CHOLANGIOGRAM FLUOROSCOPY TIME:  15 seconds (6 mGy) COMPARISON:  Intraoperative cholangiogram-earlier same day; MRCP-02/26/2021 FINDINGS: Repeat intraoperative cholangiographic images of the right upper abdominal quadrant during laparoscopic cholecystectomy are provided for review. Surgical clips overlie the expected location of the gallbladder fossa. Contrast injection demonstrates selective cannulation of the central aspect of the cystic duct though there is minimal extravasation of contrast about the cystic duct cannulation site. There is passage of contrast through the central aspect of the cystic duct with filling of a moderately dilated common bile duct. Contrast from preceding intraoperative cholangiogram is seen within the duodenum. There is minimal passage of contrast through the distal aspect of the CBD with persistent lentiform filling defect within the distal aspect of the CBD potentially representative nonocclusive choledocholithiasis. There is minimal reflux of injected contrast into the common hepatic duct and central aspect of the non dilated intrahepatic biliary system.  IMPRESSION: Nonocclusive lentiform filling defect within the distal aspect of the CBD, nonspecific though suggestive of nonocclusive choledocholithiasis. Further evaluation and management with ERCP could be performed as indicated. Electronically Signed   By: Sandi Mariscal M.D.   On: 03/03/2021 13:47   DG Cholangiogram Operative  Result Date: 03/03/2021 CLINICAL DATA:  Choledocholithiasis and gallbladder wall thickening on MRCP EXAM: INTRAOPERATIVE CHOLANGIOGRAM TECHNIQUE: Cholangiographic images from the C-arm fluoroscopic device were submitted for interpretation post-operatively. Please see the procedural report for the amount of contrast and the fluoroscopy time utilized. COMPARISON:  MRCP 02/26/2021 FINDINGS: Mobile filling defects in the distal common duct. Intrahepatic ducts are incompletely visualized, appearing decompressed centrally. Minimal contrast passes into the duodenum. : Probable incompletely/intermittently occlusive distal choledocholithiasis. I telephoned the test results to Dr. Bobbye Morton at the time of interpretation. Electronically Signed   By: Lucrezia Europe M.D.   On: 03/03/2021 13:22    ASSESMENT:   *     choledocholithiasis on IOC at time of lap chole 5/2.  *     biliary pancreatitis   PLAN   *    set up for ERCP tomorrow at 3 PM with Dr. Rush Landmark  *    allow heart healthy diet today.  Clear liquid breakfast in the morning and n.p.o. after 9 AM for the afternoon procedure. Stop Lovenox after today.  *    Continues on Zosyn.  Azucena Freed  03/04/2021, 11:22 AM Phone [phone]

## 2021-03-04 NOTE — Progress Notes (Signed)
Progress Note  1 Day Post-Op  Subjective: Patient with some abdominal soreness. No nausea. Tolerated CLD well yesterday. Daughter at bedside.   Objective: Vital signs in last 24 hours: Temp:  [97.5 F (36.4 C)-98.3 F (36.8 C)] 98.3 F (36.8 C) (05/03 0516) Pulse Rate:  [73-91] 81 (05/03 0516) Resp:  [15-19] 17 (05/03 0225) BP: (124-192)/(75-104) 137/76 (05/03 0516) SpO2:  [94 %-99 %] 98 % (05/03 0516) Last BM Date: 02/28/21  Intake/Output from previous day: 05/02 0701 - 05/03 0700 In: 1270 [P.O.:420; I.V.:850] Out: 900 [Urine:750; Blood:150] Intake/Output this shift: No intake/output data recorded.  PE: General: pleasant, WD, WN male who is laying in bed in NAD HEENT: head is normocephalic, atraumatic.  Sclera are anicteric.   Heart: regular, rate, and rhythm.   Lungs: CTAB, no wheezes, rhonchi, or rales noted.  Respiratory effort nonlabored Abd: soft, NT, ND, +BS, incisions c/d/i MS: all 4 extremities are symmetrical with no cyanosis, clubbing, or edema.    Lab Results:  Recent Labs    03/03/21 0031 03/04/21 0211  WBC 6.0 11.0*  HGB 12.0* 12.3*  HCT 35.8* 36.2*  PLT 219 243   BMET Recent Labs    03/03/21 0031 03/04/21 0211  NA 136 134*  K 3.6 3.7  CL 101 99  CO2 26 25  GLUCOSE 97 164*  BUN 19 19  CREATININE 1.59* 1.56*  CALCIUM 9.0 8.8*   PT/INR No results for input(s): LABPROT, INR in the last 72 hours. CMP     Component Value Date/Time   NA 134 (L) 03/04/2021 0211   K 3.7 03/04/2021 0211   CL 99 03/04/2021 0211   CO2 25 03/04/2021 0211   GLUCOSE 164 (H) 03/04/2021 0211   BUN 19 03/04/2021 0211   CREATININE 1.56 (H) 03/04/2021 0211   CALCIUM 8.8 (L) 03/04/2021 0211   PROT 6.4 (L) 03/04/2021 0211   ALBUMIN 3.0 (L) 03/04/2021 0211   AST 185 (H) 03/04/2021 0211   ALT 185 (H) 03/04/2021 0211   ALKPHOS 151 (H) 03/04/2021 0211   BILITOT 1.7 (H) 03/04/2021 0211   GFRNONAA 42 (L) 03/04/2021 0211   GFRAA 45 (L) 03/11/2020 0217   Lipase      Component Value Date/Time   LIPASE 51 03/04/2021 0211       Studies/Results: CT ABDOMEN PELVIS WO CONTRAST  Result Date: 03/02/2021 CLINICAL DATA:  87 year old male for follow-up of biliary pancreatitis and cholelithiasis/choledocholithiasis. EXAM: CT ABDOMEN AND PELVIS WITHOUT CONTRAST TECHNIQUE: Multidetector CT imaging of the abdomen and pelvis was performed following the standard protocol without IV contrast. COMPARISON:  02/26/2021 MRCP, 02/25/2021 CT and prior studies FINDINGS: Please note that parenchymal abnormalities may be missed without intravenous contrast. Lower chest: Mild bibasilar atelectasis/scarring again noted with trace RIGHT pleural effusion. Hepatobiliary: No hepatic abnormalities are identified. The gallbladder is less distended with continued wall thickening and possible mild adjacent stranding. No biliary dilatation identified. Pancreas: Unremarkable. No peripancreatic inflammation is identified. Spleen: Unremarkable Adrenals/Urinary Tract: The kidneys, adrenal glands and bladder are unremarkable. Stomach/Bowel: No bowel wall thickening, inflammatory changes or bowel obstruction identified. Vascular/Lymphatic: Aortic atherosclerotic calcifications again noted. Mildly prominent periportal lymph nodes appear unchanged from remote studies. Reproductive: Prostate enlargement again noted. Other: A moderate to large LEFT inguinal hernia containing a portion of colon is again identified, but without bowel wall thickening or bowel obstruction. No ascites, pneumoperitoneum or focal collection noted. Musculoskeletal: No acute or suspicious bony abnormalities are identified. Degenerative changes in the lumbar spine again noted. IMPRESSION: 1. Decreased  gallbladder distension with continued apparent gallbladder wall thickening and mild adjacent stranding. No biliary dilatation. 2. Unremarkable appearing pancreas. No evidence of peripancreatic inflammation. 3. Unchanged moderate to large LEFT  inguinal hernia containing a portion of colon. No evidence of bowel wall thickening or bowel obstruction. 4. Aortic Atherosclerosis (ICD10-I70.0). Electronically Signed   By: Margarette Canada M.D.   On: 03/02/2021 15:17   DG Cholangiogram Operative  Result Date: 03/03/2021 CLINICAL DATA:  Intraoperative cholangiogram during laparoscopic cholecystectomy. EXAM: INTRAOPERATIVE CHOLANGIOGRAM FLUOROSCOPY TIME:  15 seconds (6 mGy) COMPARISON:  Intraoperative cholangiogram-earlier same day; MRCP-02/26/2021 FINDINGS: Repeat intraoperative cholangiographic images of the right upper abdominal quadrant during laparoscopic cholecystectomy are provided for review. Surgical clips overlie the expected location of the gallbladder fossa. Contrast injection demonstrates selective cannulation of the central aspect of the cystic duct though there is minimal extravasation of contrast about the cystic duct cannulation site. There is passage of contrast through the central aspect of the cystic duct with filling of a moderately dilated common bile duct. Contrast from preceding intraoperative cholangiogram is seen within the duodenum. There is minimal passage of contrast through the distal aspect of the CBD with persistent lentiform filling defect within the distal aspect of the CBD potentially representative nonocclusive choledocholithiasis. There is minimal reflux of injected contrast into the common hepatic duct and central aspect of the non dilated intrahepatic biliary system. IMPRESSION: Nonocclusive lentiform filling defect within the distal aspect of the CBD, nonspecific though suggestive of nonocclusive choledocholithiasis. Further evaluation and management with ERCP could be performed as indicated. Electronically Signed   By: Sandi Mariscal M.D.   On: 03/03/2021 13:47   DG Cholangiogram Operative  Result Date: 03/03/2021 CLINICAL DATA:  Choledocholithiasis and gallbladder wall thickening on MRCP EXAM: INTRAOPERATIVE CHOLANGIOGRAM  TECHNIQUE: Cholangiographic images from the C-arm fluoroscopic device were submitted for interpretation post-operatively. Please see the procedural report for the amount of contrast and the fluoroscopy time utilized. COMPARISON:  MRCP 02/26/2021 FINDINGS: Mobile filling defects in the distal common duct. Intrahepatic ducts are incompletely visualized, appearing decompressed centrally. Minimal contrast passes into the duodenum. : Probable incompletely/intermittently occlusive distal choledocholithiasis. I telephoned the test results to Dr. Bobbye Morton at the time of interpretation. Electronically Signed   By: Lucrezia Europe M.D.   On: 03/03/2021 13:22    Anti-infectives: Anti-infectives (From admission, onward)   Start     Dose/Rate Route Frequency Ordered Stop   02/26/21 1000  piperacillin-tazobactam (ZOSYN) IVPB 3.375 g        3.375 g 12.5 mL/hr over 240 Minutes Intravenous Every 8 hours 02/26/21 0456     02/26/21 0000  ceFEPIme (MAXIPIME) 2 g in sodium chloride 0.9 % 100 mL IVPB        2 g 200 mL/hr over 30 Minutes Intravenous  Once 02/25/21 2351 02/26/21 0405   02/26/21 0000  metroNIDAZOLE (FLAGYL) IVPB 500 mg        500 mg 100 mL/hr over 60 Minutes Intravenous  Once 02/25/21 2351 02/26/21 7106       Assessment/Plan Hx of CVA/TIA - on plavix, LD 4/26 AM Hx of gastric ulcers HTN HLD CKD stage III Dementia Hiatal hernia  Left inguinal hernia - not incarcerated, can follow up outpatient to discuss elective repair   Biliary pancreatitis  S/p laparoscopic cholecystectomy with IOC 03/03/21 Dr. Bobbye Morton - POD#1 - GI planning ERCP 5/4 - diet as tolerated from a surgical standpoint - ok to shower - follow up in office in 2-3 weeks - ok to resume plavix after  ERCP when GI ok with it   FEN: CLD VTE: SCDs ID: Zosyn 4/27>>  LOS: 6 days    Norm Parcel, [facility] 03/04/2021, 10:57 AM Please see Amion for pager number during day hours 7:00am-4:30pm

## 2021-03-05 ENCOUNTER — Inpatient Hospital Stay (HOSPITAL_COMMUNITY): Payer: Medicare Other

## 2021-03-05 ENCOUNTER — Inpatient Hospital Stay (HOSPITAL_COMMUNITY): Payer: Medicare Other | Admitting: Certified Registered"

## 2021-03-05 ENCOUNTER — Encounter (HOSPITAL_COMMUNITY)
Admission: EM | Disposition: A | Discharge status: Home Health | Payer: Self-pay | Source: Home / Self Care | Attending: Internal Medicine

## 2021-03-05 ENCOUNTER — Encounter (HOSPITAL_COMMUNITY): Payer: Self-pay | Admitting: Family Medicine

## 2021-03-05 DIAGNOSIS — K838 Other specified diseases of biliary tract: Secondary | ICD-10-CM

## 2021-03-05 DIAGNOSIS — K3189 Other diseases of stomach and duodenum: Secondary | ICD-10-CM

## 2021-03-05 HISTORY — PX: ENDOSCOPIC RETROGRADE CHOLANGIOPANCREATOGRAPHY (ERCP) WITH PROPOFOL: SHX5810

## 2021-03-05 HISTORY — PX: SPHINCTEROTOMY: SHX5544

## 2021-03-05 HISTORY — PX: REMOVAL OF STONES: SHX5545

## 2021-03-05 HISTORY — PX: BIOPSY: SHX5522

## 2021-03-05 LAB — CBC
HCT: 34 % — ABNORMAL LOW (ref 39.0–52.0)
Hemoglobin: 11.3 g/dL — ABNORMAL LOW (ref 13.0–17.0)
MCH: 30.2 pg (ref 26.0–34.0)
MCHC: 33.2 g/dL (ref 30.0–36.0)
MCV: 90.9 fL (ref 80.0–100.0)
Platelets: 235 10*3/uL (ref 150–400)
RBC: 3.74 MIL/uL — ABNORMAL LOW (ref 4.22–5.81)
RDW: 13.4 % (ref 11.5–15.5)
WBC: 11.3 10*3/uL — ABNORMAL HIGH (ref 4.0–10.5)
nRBC: 0 % (ref 0.0–0.2)

## 2021-03-05 LAB — COMPREHENSIVE METABOLIC PANEL
ALT: 206 U/L — ABNORMAL HIGH (ref 0–44)
AST: 185 U/L — ABNORMAL HIGH (ref 15–41)
Albumin: 2.8 g/dL — ABNORMAL LOW (ref 3.5–5.0)
Alkaline Phosphatase: 126 U/L (ref 38–126)
Anion gap: 6 (ref 5–15)
BUN: 24 mg/dL — ABNORMAL HIGH (ref 8–23)
CO2: 28 mmol/L (ref 22–32)
Calcium: 8.6 mg/dL — ABNORMAL LOW (ref 8.9–10.3)
Chloride: 102 mmol/L (ref 98–111)
Creatinine, Ser: 1.75 mg/dL — ABNORMAL HIGH (ref 0.61–1.24)
GFR, Estimated: 37 mL/min — ABNORMAL LOW (ref >60)
Glucose, Bld: 102 mg/dL — ABNORMAL HIGH (ref 70–99)
Potassium: 3.5 mmol/L (ref 3.5–5.1)
Sodium: 136 mmol/L (ref 135–145)
Total Bilirubin: 1.7 mg/dL — ABNORMAL HIGH (ref 0.3–1.2)
Total Protein: 6 g/dL — ABNORMAL LOW (ref 6.5–8.1)

## 2021-03-05 SURGERY — ENDOSCOPIC RETROGRADE CHOLANGIOPANCREATOGRAPHY (ERCP) WITH PROPOFOL
Anesthesia: General

## 2021-03-05 MED ORDER — LIDOCAINE 2% (20 MG/ML) 5 ML SYRINGE
INTRAMUSCULAR | Status: DC | PRN
  Administered 2021-03-05: 60 mg via INTRAVENOUS

## 2021-03-05 MED ORDER — INDOMETHACIN 50 MG RE SUPP
RECTAL | Status: DC | PRN
  Administered 2021-03-05: 100 mg via RECTAL

## 2021-03-05 MED ORDER — INDOMETHACIN 50 MG RE SUPP
RECTAL | Status: AC
  Filled 2021-03-05: qty 2

## 2021-03-05 MED ORDER — ROCURONIUM BROMIDE 10 MG/ML (PF) SYRINGE
PREFILLED_SYRINGE | INTRAVENOUS | Status: DC | PRN
  Administered 2021-03-05: 80 mg via INTRAVENOUS

## 2021-03-05 MED ORDER — PROPOFOL 10 MG/ML IV BOLUS
INTRAVENOUS | Status: DC | PRN
  Administered 2021-03-05: 170 mg via INTRAVENOUS

## 2021-03-05 MED ORDER — ONDANSETRON HCL 4 MG/2ML IJ SOLN: 4.0000 mg | Freq: Once | INTRAMUSCULAR | Status: DC | PRN

## 2021-03-05 MED ORDER — OXYCODONE HCL 5 MG/5ML PO SOLN: 5.0000 mg | Freq: Once | ORAL | Status: DC | PRN

## 2021-03-05 MED ORDER — FENTANYL CITRATE (PF) 100 MCG/2ML IJ SOLN
INTRAMUSCULAR | Status: DC | PRN
  Administered 2021-03-05: 50 ug via INTRAVENOUS
  Administered 2021-03-05 (×2): 25 ug via INTRAVENOUS

## 2021-03-05 MED ORDER — GLUCAGON HCL RDNA (DIAGNOSTIC) 1 MG IJ SOLR
INTRAMUSCULAR | Status: AC
  Filled 2021-03-05: qty 1

## 2021-03-05 MED ORDER — EPHEDRINE SULFATE-NACL 50-0.9 MG/10ML-% IV SOSY
PREFILLED_SYRINGE | INTRAVENOUS | Status: DC | PRN
  Administered 2021-03-05: 10 mg via INTRAVENOUS
  Administered 2021-03-05 (×2): 15 mg via INTRAVENOUS

## 2021-03-05 MED ORDER — LACTATED RINGERS IV SOLN: INTRAVENOUS | Status: DC | PRN

## 2021-03-05 MED ORDER — CIPROFLOXACIN IN D5W 400 MG/200ML IV SOLN
INTRAVENOUS | Status: AC
  Filled 2021-03-05: qty 200

## 2021-03-05 MED ORDER — FENTANYL CITRATE (PF) 100 MCG/2ML IJ SOLN: 25.0000 ug | INTRAMUSCULAR | Status: DC | PRN

## 2021-03-05 MED ORDER — OXYCODONE HCL 5 MG PO TABS: 5.0000 mg | ORAL_TABLET | Freq: Once | ORAL | Status: DC | PRN

## 2021-03-05 MED ORDER — GLUCAGON HCL RDNA (DIAGNOSTIC) 1 MG IJ SOLR
INTRAMUSCULAR | Status: DC | PRN
  Administered 2021-03-05 (×4): .25 mg via INTRAVENOUS

## 2021-03-05 MED ORDER — PANTOPRAZOLE SODIUM 40 MG PO TBEC
40.0000 mg | DELAYED_RELEASE_TABLET | Freq: Two times a day (BID) | ORAL | Status: DC
  Administered 2021-03-06: 40 mg via ORAL
  Filled 2021-03-05: qty 1

## 2021-03-05 MED ORDER — ONDANSETRON HCL 4 MG/2ML IJ SOLN
INTRAMUSCULAR | Status: DC | PRN
  Administered 2021-03-05: 4 mg via INTRAVENOUS

## 2021-03-05 MED ORDER — SODIUM CHLORIDE 0.9 % IV SOLN
INTRAVENOUS | Status: DC | PRN
  Administered 2021-03-05: 12.5 mL

## 2021-03-05 MED ORDER — SUGAMMADEX SODIUM 200 MG/2ML IV SOLN
INTRAVENOUS | Status: DC | PRN
  Administered 2021-03-05: 200 mg via INTRAVENOUS

## 2021-03-05 MED ORDER — CIPROFLOXACIN IN D5W 400 MG/200ML IV SOLN
INTRAVENOUS | Status: DC | PRN
  Administered 2021-03-05: 400 mg via INTRAVENOUS

## 2021-03-05 MED ORDER — FENTANYL CITRATE (PF) 100 MCG/2ML IJ SOLN
INTRAMUSCULAR | Status: AC
  Filled 2021-03-05: qty 2

## 2021-03-05 MED ORDER — PHENYLEPHRINE 40 MCG/ML (10ML) SYRINGE FOR IV PUSH (FOR BLOOD PRESSURE SUPPORT)
PREFILLED_SYRINGE | INTRAVENOUS | Status: DC | PRN
Start: 2021-03-05 — End: 2021-03-05
  Administered 2021-03-05: 120 ug via INTRAVENOUS
  Administered 2021-03-05 (×3): 80 ug via INTRAVENOUS

## 2021-03-05 NOTE — Interval H&P Note (Signed)
History and Physical Interval Note:  03/05/2021 3:39 PM  Leroy [name]  has presented today for surgery, with the diagnosis of Choledocholithiasis.  The various methods of treatment have been discussed with the patient and family. After consideration of risks, benefits and other options for treatment, the patient has consented to  Procedure(s): ENDOSCOPIC RETROGRADE CHOLANGIOPANCREATOGRAPHY (ERCP) WITH PROPOFOL (N/A) as a surgical intervention.  The patient's history has been reviewed, patient examined, no change in status, stable for surgery.  I have reviewed the patient's chart and labs.  Questions were answered to the patient's satisfaction.    The risks of an ERCP were discussed at length, including but not limited to the risk of perforation, bleeding, abdominal pain, post-ERCP pancreatitis (while usually mild can be severe and even life threatening).  The risks and benefits of endoscopic evaluation were discussed with the patient; these include but are not limited to the risk of perforation, infection, bleeding, missed lesions, lack of diagnosis, severe illness requiring hospitalization, as well as anesthesia and sedation related illnesses.  The patient is agreeable to proceed.    [employer]

## 2021-03-05 NOTE — Anesthesia Preprocedure Evaluation (Signed)
Anesthesia Evaluation  Patient identified by MRN, date of birth, ID band Patient awake    Reviewed: Allergy & Precautions, NPO status , Patient's Chart, lab work & pertinent test results  Airway Mallampati: II  TM Distance: >3 FB Neck ROM: Full    Dental  (+) Teeth Intact, Dental Advisory Given,    Pulmonary    breath sounds clear to auscultation       Cardiovascular hypertension,  Rhythm:Regular Rate:Normal     Neuro/Psych    GI/Hepatic   Endo/Other    Renal/GU      Musculoskeletal   Abdominal   Peds  Hematology   Anesthesia Other Findings   Reproductive/Obstetrics                             Anesthesia Physical Anesthesia Plan  ASA: III  Anesthesia Plan: General   Post-op Pain Management:    Induction: Intravenous  PONV Risk Score and Plan: Ondansetron and Dexamethasone  Airway Management Planned: Oral ETT  Additional Equipment:   Intra-op Plan:   Post-operative Plan: Extubation in OR  Informed Consent: I have reviewed the patients History and Physical, chart, labs and discussed the procedure including the risks, benefits and alternatives for the proposed anesthesia with the patient or authorized representative who has indicated his/her understanding and acceptance.     Dental advisory given  Plan Discussed with: CRNA and Anesthesiologist  Anesthesia Plan Comments:         Anesthesia Quick Evaluation

## 2021-03-05 NOTE — Progress Notes (Addendum)
PROGRESS NOTE    KOY LAMP  EXB:[id number] DOB: 1930/06/20 DOA: 02/25/2021 PCP: Idelle Crouch, MD   Brief Narrative: Patient is a 87 years old male with history of dementia,  CKD 3B, essential hypertension who initially presented to the hospital with acute onset of severe abdominal pain that started on 02/25/2021.  In the ED he was noted to have a lipase of 3570, abnormal liver function tests.  A CT scan of the abdomen and pelvis was concerning for distended gallbladder with wall thickening and cholelithiasis.  Patient was then admitted for acute gallstone pancreatitis.    During hospitalization, patient underwent laparoscopic cholecystectomy with intraoperative cholangiogram on 03/04/2021 with findings of filling defect in the common bile duct.  Awaiting for ERCP today.  Assessment & Plan:  Acute gallstone pancreatitis with Cholelithiasis, choledocholithiasis  MRCP was performed which was concerning for cholelithiasis.  Could not rule out choledocholithiasis.  GI was consulted and due to abrupt truncation of distal CBD it was felt to be from pancreatic edema.  Patient was initially treated with bowel rest, supportive care and IV Zosyn which he has completed course.  General surgery was consulted and underwent a laparoscopic cholecystectomy with intraoperative cholangiogram on 03/04/2021 with findings of filling defect in the common bile duct.  GI was reconsulted for or ERCP.  At this time patient is awaiting for ERCP  CKD 3b Latest creatinine of 1.7.  We will continue to monitor.  Dementia Patient is at risk for delirium.  We will closely monitor.  History of CVA -Plavix on hold  Hypertension Continue Imdur, metoprolol.  Continue to monitor blood pressure closely.  Hypothyroidism Continue Synthroid  Debility, weakness. Will get PT evaluation.   DVT prophylaxis:  Hold Lovenox for ERCP  Code Status:  DNR  Family Communication:  None today.    Disposition Plan:   Status is: Inpatient  Remains inpatient appropriate because:Inpatient level of care appropriate due to severity of illness  Dispo: The patient is from: Home              Anticipated d/c is to: Home with home health likely.              Patient currently is not medically stable to d/c.   Difficult to place patient No  Consultants:   General surgery  Gastroenterology  Procedures:  Lap chole with IOC, Dr. Bobbye Morton 5/2  Antimicrobials:  None  Subjective: Today, patient was seen and examined at bedside.  Patient denies any overt pain, nausea, vomiting or fever.    Objective: Vitals:   03/04/21 0516 03/04/21 1600 03/04/21 2027 03/05/21 0500  BP: 137/76 132/78 132/84 (!) 150/84  Pulse: 81 62 74 67  Resp:  20 17 18   Temp: 98.3 F (36.8 C) 97.7 F (36.5 C) 98 F (36.7 C) 97.8 F (36.6 C)  TempSrc: Oral  Oral Oral  SpO2: 98% 96% 96% 98%  Weight:      Height:        Intake/Output Summary (Last 24 hours) at 03/05/2021 1100 Last data filed at 03/05/2021 0900 Gross per 24 hour  Intake 0 ml  Output 1050 ml  Net -1050 ml   Filed Weights   02/25/21 2043  Weight: 100.7 kg    Physical examination: General:  Average built, not in obvious distress, elderly male, oriented to self and place, HENT:   No scleral pallor or icterus noted. Oral mucosa is moist.  Chest:    Diminished breath sounds bilaterally. CVS: S1 &S2  heard. No murmur.  Regular rate and rhythm. Abdomen: Soft,  mildly distended, bowel sounds are heard.   Extremities: No cyanosis, clubbing or edema.  Peripheral pulses are palpable. Psych: Alert, awake and oriented to self and place. CNS:  No cranial nerve deficits.  Power equal in all extremities.   Skin: Warm and dry.   Data Reviewed: I have personally reviewed the following labs and imaging studies  CBC: Recent Labs  Lab 03/01/21 0045 03/02/21 0134 03/03/21 0031 03/04/21 0211 03/05/21 0235  WBC 9.1 5.8 6.0 11.0* 11.3*  HGB 12.5* 11.9* 12.0* 12.3* 11.3*   HCT 37.3* 35.4* 35.8* 36.2* 34.0*  MCV 90.3 90.8 89.5 88.9 90.9  PLT 218 201 219 243 973   Basic Metabolic Panel: Recent Labs  Lab 03/01/21 0045 03/02/21 0134 03/03/21 0031 03/04/21 0211 03/05/21 0235  NA 137 137 136 134* 136  K 3.5 3.3* 3.6 3.7 3.5  CL 99 101 101 99 102  CO2 26 24 26 25 28   GLUCOSE 120* 107* 97 164* 102*  BUN 16 21 19 19  24*  CREATININE 1.59* 1.60* 1.59* 1.56* 1.75*  CALCIUM 8.9 8.8* 9.0 8.8* 8.6*  MG  --  1.9  --   --   --   PHOS  --  3.5  --   --   --    GFR: Estimated Creatinine Clearance: 36.4 mL/min (A) (by C-G formula based on SCr of 1.75 mg/dL (H)). Liver Function Tests: Recent Labs  Lab 03/01/21 0045 03/02/21 0134 03/03/21 0031 03/04/21 0211 03/05/21 0235  AST 94* 102* 110* 185* 185*  ALT 142* 129* 128* 185* 206*  ALKPHOS 159* 146* 140* 151* 126  BILITOT 4.0* 2.4* 1.8* 1.7* 1.7*  PROT 6.5 6.1* 6.2* 6.4* 6.0*  ALBUMIN 3.0* 2.8* 2.8* 3.0* 2.8*   Recent Labs  Lab 02/27/21 0100 03/02/21 0134 03/04/21 0211  LIPASE 143* 85* 51   No results for input(s): AMMONIA in the last 168 hours. Coagulation Profile: No results for input(s): INR, PROTIME in the last 168 hours. Cardiac Enzymes: No results for input(s): CKTOTAL, CKMB, CKMBINDEX, TROPONINI in the last 168 hours. BNP (last 3 results) No results for input(s): PROBNP in the last 8760 hours. HbA1C: No results for input(s): HGBA1C in the last 72 hours. CBG: No results for input(s): GLUCAP in the last 168 hours. Lipid Profile: No results for input(s): CHOL, HDL, LDLCALC, TRIG, CHOLHDL, LDLDIRECT in the last 72 hours. Thyroid Function Tests: No results for input(s): TSH, T4TOTAL, FREET4, T3FREE, THYROIDAB in the last 72 hours. Anemia Panel: No results for input(s): VITAMINB12, FOLATE, FERRITIN, TIBC, IRON, RETICCTPCT in the last 72 hours. Urine analysis:    Component Value Date/Time   COLORURINE YELLOW 02/25/2021 2045   APPEARANCEUR CLEAR 02/25/2021 2045   LABSPEC 1.012 02/25/2021  2045   PHURINE 6.0 02/25/2021 2045   GLUCOSEU NEGATIVE 02/25/2021 2045   HGBUR MODERATE (A) 02/25/2021 2045   BILIRUBINUR NEGATIVE 02/25/2021 2045   [place] 02/25/2021 2045   PROTEINUR 30 (A) 02/25/2021 2045   NITRITE NEGATIVE 02/25/2021 2045   LEUKOCYTESUR NEGATIVE 02/25/2021 2045   Sepsis Labs: @LABRCNTIP (procalcitonin:4,lacticidven:4)  ) Recent Results (from the past 240 hour(s))  Resp Panel by RT-PCR (Flu A&B, Covid) Nasopharyngeal Swab     Status: None   Collection Time: 02/25/21 11:44 PM   Specimen: Nasopharyngeal Swab; Nasopharyngeal(NP) swabs in vial transport medium  Result Value Ref Range Status   SARS Coronavirus 2 by RT PCR NEGATIVE NEGATIVE Final    Comment: (NOTE) SARS-CoV-2 target nucleic  acids are NOT DETECTED.  The SARS-CoV-2 RNA is generally detectable in upper respiratory specimens during the acute phase of infection. The lowest concentration of SARS-CoV-2 viral copies this assay can detect is 138 copies/mL. A negative result does not preclude SARS-Cov-2 infection and should not be used as the sole basis for treatment or other patient management decisions. A negative result may occur with  improper specimen collection/handling, submission of specimen other than nasopharyngeal swab, presence of viral mutation(s) within the areas targeted by this assay, and inadequate number of viral copies(<138 copies/mL). A negative result must be combined with clinical observations, patient history, and epidemiological information. The expected result is Negative.  Fact Sheet for Patients:  EntrepreneurPulse.com.au  Fact Sheet for Healthcare Providers:  IncredibleEmployment.be  This test is no t yet approved or cleared by the [place] FDA and  has been authorized for detection and/or diagnosis of SARS-CoV-2 by FDA under an Emergency Use Authorization (EUA). This EUA will remain  in effect (meaning this test can be  used) for the duration of the COVID-19 declaration under Section 564(b)(1) of the Act, 21 U.S.C.section 360bbb-3(b)(1), unless the authorization is terminated  or revoked sooner.       Influenza A by PCR NEGATIVE NEGATIVE Final   Influenza B by PCR NEGATIVE NEGATIVE Final    Comment: (NOTE) The Xpert Xpress SARS-CoV-2/FLU/RSV plus assay is intended as an aid in the diagnosis of influenza from Nasopharyngeal swab specimens and should not be used as a sole basis for treatment. Nasal washings and aspirates are unacceptable for Xpert Xpress SARS-CoV-2/FLU/RSV testing.  Fact Sheet for Patients: EntrepreneurPulse.com.au  Fact Sheet for Healthcare Providers: IncredibleEmployment.be  This test is not yet approved or cleared by the [place] FDA and has been authorized for detection and/or diagnosis of SARS-CoV-2 by FDA under an Emergency Use Authorization (EUA). This EUA will remain in effect (meaning this test can be used) for the duration of the COVID-19 declaration under Section 564(b)(1) of the Act, 21 U.S.C. section 360bbb-3(b)(1), unless the authorization is terminated or revoked.  Performed at [facility], [place] [address] [place], [place] [address]   Urine culture     Status: Abnormal   Collection Time: 02/26/21 12:15 AM   Specimen: Urine, Random  Result Value Ref Range Status   Specimen Description URINE, RANDOM  Final   Special Requests NONE  Final   Culture (A)  Final    <10,000 COLONIES/mL INSIGNIFICANT GROWTH Performed at [facility], [place] [address], [place], [place] [address]    Report Status 02/27/2021 FINAL  Final  Blood culture (routine x 2)     Status: None   Collection Time: 02/26/21 12:32 AM   Specimen: BLOOD RIGHT FOREARM  Result Value Ref Range Status   Specimen Description BLOOD RIGHT FOREARM  Final   Special Requests   Final    BOTTLES DRAWN AEROBIC AND ANAEROBIC Blood Culture results may not be  optimal due to an inadequate volume of blood received in culture bottles   Culture   Final    NO GROWTH 5 DAYS Performed at [facility], [place] [address] [place], [place] [address]    Report Status 03/03/2021 FINAL  Final  Blood culture (routine x 2)     Status: None   Collection Time: 02/26/21 12:40 AM   Specimen: BLOOD RIGHT HAND  Result Value Ref Range Status   Specimen Description BLOOD RIGHT HAND  Final   Special Requests   Final  BOTTLES DRAWN AEROBIC AND ANAEROBIC Blood Culture results may not be optimal due to an inadequate volume of blood received in culture bottles   Culture   Final    NO GROWTH 5 DAYS Performed at [facility], [place] [address] [place], [place] [address]    Report Status 03/03/2021 FINAL  Final  Surgical pcr screen     Status: None   Collection Time: 02/26/21  9:12 PM   Specimen: Nasal Mucosa; Nasal Swab  Result Value Ref Range Status   MRSA, PCR NEGATIVE NEGATIVE Final   Staphylococcus aureus NEGATIVE NEGATIVE Final    Comment: (NOTE) The Xpert SA Assay (FDA approved for NASAL specimens in patients 44 years of age and older), is one component of a comprehensive surveillance program. It is not intended to diagnose infection nor to guide or monitor treatment. Performed at [facility], [place] [address], [place], [place] [address]      Scheduled Meds: . isosorbide mononitrate  30 mg Oral Daily  . levothyroxine  50 mcg Oral Q0600  . metoprolol succinate  12.5 mg Oral Daily  . pantoprazole  40 mg Oral Q1200   Continuous Infusions: . sodium chloride 10 mL/hr at 03/03/21 0337     LOS: 7 days   Flora Lipps, MD [facility] 03/05/2021, 11:00 AM

## 2021-03-05 NOTE — Anesthesia Procedure Notes (Signed)
Procedure Name: Intubation Date/Time: 03/05/2021 3:51 PM Performed by: Myna Bright, CRNA Pre-anesthesia Checklist: Patient identified, Emergency Drugs available, Suction available and Patient being monitored Patient Re-evaluated:Patient Re-evaluated prior to induction Oxygen Delivery Method: Circle system utilized Preoxygenation: Pre-oxygenation with 100% oxygen Induction Type: IV induction Ventilation: Mask ventilation without difficulty Laryngoscope Size: Mac and 4 Grade View: Grade I Tube type: Oral Tube size: 7.5 mm Number of attempts: 1 Airway Equipment and Method: Stylet Placement Confirmation: ETT inserted through vocal cords under direct vision,  positive ETCO2 and breath sounds checked- equal and bilateral Secured at: 22 cm Tube secured with: Tape Dental Injury: Teeth and Oropharynx as per pre-operative assessment

## 2021-03-05 NOTE — Progress Notes (Signed)
Progress Note  2 Days Post-Op  Subjective: He has no complaints this am. Tolerated diet well yesterday and now NPO for ERCP. Wife is at bedside   Objective: Vital signs in last 24 hours: Temp:  [97.7 F (36.5 C)-98 F (36.7 C)] 97.8 F (36.6 C) (05/04 0500) Pulse Rate:  [62-74] 67 (05/04 0500) Resp:  [17-20] 18 (05/04 0500) BP: (132-150)/(78-84) 150/84 (05/04 0500) SpO2:  [96 %-98 %] 98 % (05/04 0500) Last BM Date: 03/04/21  Intake/Output from previous day: 05/03 0701 - 05/04 0700 In: -  Out: 650 [Urine:650] Intake/Output this shift: Total I/O In: -  Out: 400 [Urine:400]  PE: General: pleasant, WD, WN male who is laying in bed in NAD HEENT: head is normocephalic, atraumatic.  Sclera are anicteric.   Heart: regular, rate, and rhythm.   Lungs: CTAB, no wheezes, rhonchi, or rales noted.  Respiratory effort nonlabored Abd: soft, NT, ND, +BS, incisions with glue intact. No erythema or discharge MS: all 4 extremities are symmetrical with no cyanosis, clubbing, or edema.    Lab Results:  Recent Labs    03/04/21 0211 03/05/21 0235  WBC 11.0* 11.3*  HGB 12.3* 11.3*  HCT 36.2* 34.0*  PLT 243 235   BMET Recent Labs    03/04/21 0211 03/05/21 0235  NA 134* 136  K 3.7 3.5  CL 99 102  CO2 25 28  GLUCOSE 164* 102*  BUN 19 24*  CREATININE 1.56* 1.75*  CALCIUM 8.8* 8.6*   PT/INR No results for input(s): LABPROT, INR in the last 72 hours. CMP     Component Value Date/Time   NA 136 03/05/2021 0235   K 3.5 03/05/2021 0235   CL 102 03/05/2021 0235   CO2 28 03/05/2021 0235   GLUCOSE 102 (H) 03/05/2021 0235   BUN 24 (H) 03/05/2021 0235   CREATININE 1.75 (H) 03/05/2021 0235   CALCIUM 8.6 (L) 03/05/2021 0235   PROT 6.0 (L) 03/05/2021 0235   ALBUMIN 2.8 (L) 03/05/2021 0235   AST 185 (H) 03/05/2021 0235   ALT 206 (H) 03/05/2021 0235   ALKPHOS 126 03/05/2021 0235   BILITOT 1.7 (H) 03/05/2021 0235   GFRNONAA 37 (L) 03/05/2021 0235   GFRAA 45 (L) 03/11/2020 0217    Lipase     Component Value Date/Time   LIPASE 51 03/04/2021 0211       Studies/Results: DG Cholangiogram Operative  Result Date: 03/03/2021 CLINICAL DATA:  Intraoperative cholangiogram during laparoscopic cholecystectomy. EXAM: INTRAOPERATIVE CHOLANGIOGRAM FLUOROSCOPY TIME:  15 seconds (6 mGy) COMPARISON:  Intraoperative cholangiogram-earlier same day; MRCP-02/26/2021 FINDINGS: Repeat intraoperative cholangiographic images of the right upper abdominal quadrant during laparoscopic cholecystectomy are provided for review. Surgical clips overlie the expected location of the gallbladder fossa. Contrast injection demonstrates selective cannulation of the central aspect of the cystic duct though there is minimal extravasation of contrast about the cystic duct cannulation site. There is passage of contrast through the central aspect of the cystic duct with filling of a moderately dilated common bile duct. Contrast from preceding intraoperative cholangiogram is seen within the duodenum. There is minimal passage of contrast through the distal aspect of the CBD with persistent lentiform filling defect within the distal aspect of the CBD potentially representative nonocclusive choledocholithiasis. There is minimal reflux of injected contrast into the common hepatic duct and central aspect of the non dilated intrahepatic biliary system. IMPRESSION: Nonocclusive lentiform filling defect within the distal aspect of the CBD, nonspecific though suggestive of nonocclusive choledocholithiasis. Further evaluation and management with ERCP  could be performed as indicated. Electronically Signed   By: Sandi Mariscal M.D.   On: 03/03/2021 13:47   DG Cholangiogram Operative  Result Date: 03/03/2021 CLINICAL DATA:  Choledocholithiasis and gallbladder wall thickening on MRCP EXAM: INTRAOPERATIVE CHOLANGIOGRAM TECHNIQUE: Cholangiographic images from the C-arm fluoroscopic device were submitted for interpretation  post-operatively. Please see the procedural report for the amount of contrast and the fluoroscopy time utilized. COMPARISON:  MRCP 02/26/2021 FINDINGS: Mobile filling defects in the distal common duct. Intrahepatic ducts are incompletely visualized, appearing decompressed centrally. Minimal contrast passes into the duodenum. : Probable incompletely/intermittently occlusive distal choledocholithiasis. I telephoned the test results to Dr. Bobbye Morton at the time of interpretation. Electronically Signed   By: Lucrezia Europe M.D.   On: 03/03/2021 13:22    Anti-infectives: Anti-infectives (From admission, onward)   Start     Dose/Rate Route Frequency Ordered Stop   02/26/21 1000  piperacillin-tazobactam (ZOSYN) IVPB 3.375 g  Status:  Discontinued        3.375 g 12.5 mL/hr over 240 Minutes Intravenous Every 8 hours 02/26/21 0456 03/04/21 1531   02/26/21 0000  ceFEPIme (MAXIPIME) 2 g in sodium chloride 0.9 % 100 mL IVPB        2 g 200 mL/hr over 30 Minutes Intravenous  Once 02/25/21 2351 02/26/21 0405   02/26/21 0000  metroNIDAZOLE (FLAGYL) IVPB 500 mg        500 mg 100 mL/hr over 60 Minutes Intravenous  Once 02/25/21 2351 02/26/21 1937       Assessment/Plan Hx of CVA/TIA - on plavix, LD 4/26 AM Hx of gastric ulcers HTN HLD CKD stage III Dementia Hiatal hernia  Left inguinal hernia - not incarcerated, can follow up outpatient to discuss elective repair   Biliary pancreatitis  S/p laparoscopic cholecystectomy with IOC 03/03/21 Dr. Bobbye Morton - POD#2 - GI planning ERCP 5/4 - diet as tolerated from a surgical standpoint - ok to shower - follow up in office in 2-3 weeks - ok to resume plavix after ERCP when GI ok with it   FEN: NPO VTE: SCDs ID: Zosyn 4/27>>  LOS: 7 days    Winferd Humphrey, [facility] Surgery 03/05/2021, 9:40 AM Please see Amion for pager number during day hours 7:00am-4:30pm

## 2021-03-05 NOTE — Op Note (Signed)
[facility] Patient Name: Leroy [name] Procedure Date : 03/05/2021 MRN: [id number] Attending MD: Justice Britain , MD Date of Birth: 1930-05-10 CSN: [id number] Age: 87 Admit Type: Inpatient Procedure:                ERCP Indications:              Bile duct stone(s), Filling defect on                            intraoperative cholangiogram, Abnormal liver                            function test Providers:                Justice Britain, MD, Erenest Rasher, RN, Elspeth Cho Tech., Technician, Barrett Henle, CRNA Referring MD:             Jesusita Oka, Gerrit Heck, MD, Docia Chuck.                            Henrene Pastor, MD, [facility] Medicines:                General Anesthesia, Cipro 400 mg IV, Indomethacin                            100 mg PR, Glucagon 1 mg IV Complications:            No immediate complications. Estimated Blood Loss:     Estimated blood loss was minimal. Procedure:                Pre-Anesthesia Assessment:                           - Prior to the procedure, a History and Physical                            was performed, and patient medications and                            allergies were reviewed. The patient's tolerance of                            previous anesthesia was also reviewed. The risks                            and benefits of the procedure and the sedation                            options and risks were discussed with the patient.                            All questions were answered, and informed consent  was obtained. Prior Anticoagulants: The patient has                            taken Plavix (clopidogrel), last dose was 8 days                            prior to procedure. ASA Grade Assessment: III - A                            patient with severe systemic disease. After                            reviewing the risks and benefits, the patient was                             deemed in satisfactory condition to undergo the                            procedure.                           - Prior to the procedure, a History and Physical                            was performed, and patient medications and                            allergies were reviewed. The patient's tolerance of                            previous anesthesia was also reviewed. The risks                            and benefits of the procedure and the sedation                            options and risks were discussed with the patient.                            All questions were answered, and informed consent                            was obtained. Prior Anticoagulants: The patient has                            taken Lovenox (enoxaparin), last dose was 1 day                            prior to procedure. After reviewing the risks and                            benefits, the patient was deemed in satisfactory  condition to undergo the procedure.                           After obtaining informed consent, the scope was                            passed under direct vision. Throughout the                            procedure, the patient's blood pressure, pulse, and                            oxygen saturations were monitored continuously. The                            TJF- Q180V ([id number]) Olympus duodenoscope was                            introduced through the mouth, and used to inject                            contrast into and used to inject contrast into the                            bile duct. The ERCP was unusually difficult due to                            challenging cannulation. Successful completion of                            the procedure was aided by performing the maneuvers                            documented (below) in this report. The patient                            tolerated the procedure. Scope In: Scope Out: Findings:       A scout film of the abdomen was obtained. Surgical clips, consistent       with a previous cholecystectomy, were seen in the area of the right       upper quadrant of the abdomen.      The upper GI tract was traversed under direct vision without detailed       examination. Patchy mildly erythematous mucosa without bleeding was       found in the entire examined stomach. Biopsies were taken in the cardia,       on the greater curvature of the stomach, on the lesser curvature of the       stomach, at the incisura and in the gastric antrum through the ERCP       scope with the cold forceps for histology and HP evaluation. The major       papilla was edematous and angulated and best viewed in long-position.      The bile duct could not be cannulated with the Hydratome sphincterotome  and Revolution Jagtome sphincterotome using normal wires and angled       wires. I tried to reposition into a short position without success and       then again into the long-position. I thought I could see an orifice       though bile did not release during the procedure initially.      Decision made to attempt biliary fistulotomy. A biliary pre-cut       measuring 7 mm in length was made with a monofilament needle knife using       a freehand technique using ERBE electrocautery. There was no       post-sphincterotomy bleeding. An angled short 0.025 inch Revolution       Antonietta Breach was then able to be passed into the biliary tree. The Revolution       Jagtome sphincterotome was then exchanged for and passed over the       guidewire and the bile duct was then deeply cannulated. Contrast was       injected. I personally interpreted the bile duct images. Ductal flow of       contrast was adequate. Image quality was adequate. Contrast extended to       the hepatic ducts. Opacification of the entire biliary tree except for       the cystic duct and gallbladder was successful. The maximum diameter of       the  ducts was 10 mm. The lower third of the main duct contained filling       defect thought to be a stone. The biliary fistulotomy was extended as a       full sphincterotomy for another 4 mm in length using ERBE       electrocautery. There was self limited oozing from the sphincterotomy       site which did not require treatment other than time. To discover       objects, the biliary tree was swept with a retrieval balloon starting at       the bifurcation. Sludge was swept from the duct. One stone was removed.       No stones remained. An occlusion cholangiogram was performed that showed       no further significant biliary pathology.      A pancreatogram was not performed.      The duodenoscope was withdrawn from the patient. Impression:               - Erythematous mucosa in the stomach - biopsied for                            HP.                           - The major papilla appeared edematous and was                            angulated laterally.                           - Failed cannulation initially led to need for                            advanced access with biliary fistulotomy and  subsequent sphincterotomy extension.                           - A filling defect consistent with a stone was seen                            on the cholangiogram distally.                           - Choledocholithiasis was found. Complete removal                            was accomplished by biliary                            fistulotomy/sphincterotomy and balloon sweep. Recommendation:           - The patient will be observed post-procedure,                            until all discharge criteria are met.                           - Return patient to hospital ward for ongoing care.                           - ADAT to Low fat diet.                           - Observe patient's clinical course.                           - Check liver enzymes (AST, ALT, alkaline                             phosphatase, bilirubin) in the morning.                           - Await path results.                           - Hold restart of Plavix for 72 hours (5/7) to                            decrease risk of post-interventional bleeding.                           - Increase to Protonix 40 mg twice daily for 1                            month and then back to once daily in effort of                            decreasing post-sphincterotomy bleeding risk.                           -  Watch for pancreatitis, bleeding, perforation,                            and cholangitis.                           - The findings and recommendations were discussed                            with the patient.                           - The findings and recommendations were discussed                            with the patient's family.                           - The findings and recommendations were discussed                            with the referring physician. Procedure Code(s):        --- Professional ---                           [phone], Endoscopic retrograde                            cholangiopancreatography (ERCP); with removal of                            calculi/debris from biliary/pancreatic duct(s)                           43262, Endoscopic retrograde                            cholangiopancreatography (ERCP); with                            sphincterotomy/papillotomy                           43261, Endoscopic retrograde                            cholangiopancreatography (ERCP); with biopsy,                            single or multiple Diagnosis Code(s):        --- Professional ---                           K31.89, Other diseases of stomach and duodenum                           R93.2, Abnormal findings on diagnostic imaging of  liver and biliary tract                           K80.50, Calculus of bile duct without cholangitis                             or cholecystitis without obstruction                           R94.5, Abnormal results of liver function studies                           K83.8, Other specified diseases of biliary tract CPT copyright 2019 American [employer] Association. All rights reserved. The codes documented in this report are preliminary and upon coder review may  be revised to meet current compliance requirements. Justice Britain, MD 03/05/2021 5:56:51 PM Number of Addenda: 0

## 2021-03-05 NOTE — Transfer of Care (Signed)
Immediate Anesthesia Transfer of Care Note  Patient: Leroy [name]  Procedure(s) Performed: ENDOSCOPIC RETROGRADE CHOLANGIOPANCREATOGRAPHY (ERCP) WITH PROPOFOL (N/A )  Patient Location: PACU  Anesthesia Type:General  Level of Consciousness: awake, alert , oriented and patient cooperative  Airway & Oxygen Therapy: Patient Spontanous Breathing and Patient connected to face mask oxygen  Post-op Assessment: Report given to RN, Post -op Vital signs reviewed and stable and Patient moving all extremities  Post vital signs: Reviewed  Last Vitals:  Vitals Value Taken Time  BP    Temp    Pulse 77 03/05/21 1745  Resp 11 03/05/21 1745  SpO2 100 % 03/05/21 1745  Vitals shown include unvalidated device data.  Last Pain:  Vitals:   03/05/21 1420  TempSrc: Temporal  PainSc: 0-No pain         Complications: No complications documented.

## 2021-03-06 ENCOUNTER — Encounter (HOSPITAL_COMMUNITY): Payer: Self-pay | Admitting: Gastroenterology

## 2021-03-06 LAB — COMPREHENSIVE METABOLIC PANEL
ALT: 201 U/L — ABNORMAL HIGH (ref 0–44)
AST: 155 U/L — ABNORMAL HIGH (ref 15–41)
Albumin: 2.7 g/dL — ABNORMAL LOW (ref 3.5–5.0)
Alkaline Phosphatase: 127 U/L — ABNORMAL HIGH (ref 38–126)
Anion gap: 9 (ref 5–15)
BUN: 25 mg/dL — ABNORMAL HIGH (ref 8–23)
CO2: 27 mmol/L (ref 22–32)
Calcium: 8.7 mg/dL — ABNORMAL LOW (ref 8.9–10.3)
Chloride: 101 mmol/L (ref 98–111)
Creatinine, Ser: 1.63 mg/dL — ABNORMAL HIGH (ref 0.61–1.24)
GFR, Estimated: 40 mL/min — ABNORMAL LOW (ref >60)
Glucose, Bld: 107 mg/dL — ABNORMAL HIGH (ref 70–99)
Potassium: 3.6 mmol/L (ref 3.5–5.1)
Sodium: 137 mmol/L (ref 135–145)
Total Bilirubin: 1.6 mg/dL — ABNORMAL HIGH (ref 0.3–1.2)
Total Protein: 6.2 g/dL — ABNORMAL LOW (ref 6.5–8.1)

## 2021-03-06 LAB — CBC
HCT: 33.5 % — ABNORMAL LOW (ref 39.0–52.0)
Hemoglobin: 11.2 g/dL — ABNORMAL LOW (ref 13.0–17.0)
MCH: 30.5 pg (ref 26.0–34.0)
MCHC: 33.4 g/dL (ref 30.0–36.0)
MCV: 91.3 fL (ref 80.0–100.0)
Platelets: 228 10*3/uL (ref 150–400)
RBC: 3.67 MIL/uL — ABNORMAL LOW (ref 4.22–5.81)
RDW: 13.2 % (ref 11.5–15.5)
WBC: 8.5 10*3/uL (ref 4.0–10.5)
nRBC: 0 % (ref 0.0–0.2)

## 2021-03-06 LAB — MAGNESIUM: Magnesium: 2 mg/dL (ref 1.7–2.4)

## 2021-03-06 LAB — PHOSPHORUS: Phosphorus: 4.4 mg/dL (ref 2.5–4.6)

## 2021-03-06 MED ORDER — PANTOPRAZOLE SODIUM 40 MG PO TBEC: DELAYED_RELEASE_TABLET | ORAL | 2 refills | Status: AC

## 2021-03-06 MED ORDER — ONDANSETRON HCL 4 MG PO TABS: 4.0000 mg | ORAL_TABLET | Freq: Four times a day (QID) | ORAL | 0 refills | Status: DC | PRN

## 2021-03-06 MED ORDER — CLOPIDOGREL BISULFATE 75 MG PO TABS: 75.0000 mg | ORAL_TABLET | Freq: Every day | ORAL | Status: DC

## 2021-03-06 NOTE — Evaluation (Signed)
Physical Therapy Evaluation Patient Details Name: Leroy [name] MRN: [id number] DOB: October 13, 1930 Today's Date: 03/06/2021   History of Present Illness  87 y.o. male with medical history significant for CKD stage IIIb, HTN, PVD, prior stroke ([place] 2017), h/o back sx, and dementia. He presented to the ED for evaluation of acute onset of severe abdominal pain. Admitted with dx of acute pancreatitis. He underwent lap chole 5/2 and ERCP 5/4.    Clinical Impression  Pt admitted with above diagnosis. PTA pt was active and independent, living at home with his wife. On eval, he required min assist bed mobility, min guard assist sit to stand with RW, min guard assist ambulation 150' with RW, and min guard assist ascend/descend 3 steps with R rail. Increased time required to complete all mobility due to decreased activity tolerance and weakness. Deficits also noted in safety awareness and balance.  Pt currently with functional limitations due to the deficits listed below (see PT Problem List). Pt will benefit from skilled PT to increase their independence and safety with mobility to allow discharge to the venue listed below.  Pt has all needed DME.      Follow Up Recommendations Home health PT;Supervision/Assistance - 24 hour    Equipment Recommendations  None recommended by PT    Recommendations for Other Services       Precautions / Restrictions Precautions Precautions: Fall      Mobility  Bed Mobility Overal bed mobility: Needs Assistance Bed Mobility: Rolling;Sidelying to Sit Rolling: Min guard Sidelying to sit: Min assist       General bed mobility comments: +rail, increased time, performed logroll and sidelying to sit to ease strain on abdominal muscles    Transfers Overall transfer level: Needs assistance Equipment used: Rolling walker (2 wheeled) Transfers: Sit to/from Stand Sit to Stand: Min guard         General transfer comment: cues for hand placement and sequencing,  increased time  Ambulation/Gait Ambulation/Gait assistance: Min guard Gait Distance (Feet): 150 Feet Assistive device: Rolling walker (2 wheeled) Gait Pattern/deviations: Step-through pattern;Decreased stride length Gait velocity: decreased Gait velocity interpretation: <1.31 ft/sec, indicative of household ambulator General Gait Details: cues to stay close to RW, min guard for safety  Stairs Stairs: Yes Stairs assistance: Min guard Stair Management: One rail Right;Sideways Number of Stairs: 3 General stair comments: cues for sequencing, increased time  Wheelchair Mobility    Modified Rankin (Stroke Patients Only)       Balance Overall balance assessment: Needs assistance Sitting-balance support: No upper extremity supported;Feet supported Sitting balance-Leahy Scale: Good     Standing balance support: Bilateral upper extremity supported;During functional activity;No upper extremity supported Standing balance-Leahy Scale: Fair Standing balance comment: static stand without UE support, RW for ambulation                             Pertinent Vitals/Pain Pain Assessment: Faces Faces Pain Scale: Hurts little more Pain Location: abdomen with mobility OOB Pain Descriptors / Indicators: Grimacing Pain Intervention(s): Monitored during session;Repositioned    Home Living Family/patient expects to be discharged to:: Private residence Living Arrangements: Spouse/significant other Available Help at Discharge: Family;Available 24 hours/day Type of Home: House Home Access: Stairs to enter Entrance Stairs-Rails: Right Entrance Stairs-Number of Steps: 3 Home Layout: One level;Laundry or work area in [place]: Environmental consultant - 2 wheels;Hand held shower head;Shower seat Additional Comments: Pt still actively farming and mowing.    Prior Function  Level of Independence: Independent               Hand Dominance   Dominant Hand: Right     Extremity/Trunk Assessment   Upper Extremity Assessment Upper Extremity Assessment: Generalized weakness    Lower Extremity Assessment Lower Extremity Assessment: Generalized weakness    Cervical / Trunk Assessment Cervical / Trunk Assessment: Kyphotic  Communication   Communication: HOH  Cognition Arousal/Alertness: Awake/alert Behavior During Therapy: WFL for tasks assessed/performed Overall Cognitive Status: History of cognitive impairments - at baseline                                 General Comments: wife present during session. Dementia at baseline.      General Comments General comments (skin integrity, edema, etc.): Pt with c/o dizziness with inital sitting EOB. Dizziness cleared after 1-2 minutes. Wife reports this is typical for pt. Instructed on need for increased time between position changes.    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 PT Goal Formulation: With patient/family Time For Goal Achievement: 03/20/21 Potential to Achieve Goals: Good    Frequency Min 3X/week   Barriers to discharge        Co-evaluation               AM-PAC PT "6 Clicks" Mobility  Outcome Measure Help needed turning from your back to your side while in a flat bed without using bedrails?: A Little Help needed moving from lying on your back to sitting on the side of a flat bed without using bedrails?: A  Little Help needed moving to and from a bed to a chair (including a wheelchair)?: A Little Help needed standing up from a chair using your arms (e.g., wheelchair or bedside chair)?: A Little Help needed to walk in hospital room?: A Little Help needed climbing 3-5 steps with a railing? : A Little 6 Click Score: 18    End of Session Equipment Utilized During Treatment: Gait belt Activity Tolerance: Patient tolerated treatment well Patient left: in chair;with call bell/phone within reach;with chair alarm set;with family/visitor present Nurse Communication: Mobility status PT Visit Diagnosis: Unsteadiness on feet (R26.81);Muscle weakness (generalized) (M62.81)    Time: 9675-9163 PT Time Calculation (min) (ACUTE ONLY): 38 min   Charges:   PT Evaluation $PT Eval Moderate Complexity: 1 Mod PT Treatments $Gait Training: 23-37 mins        Lorrin Goodell, PT  Office # [phone] Pager [phone]   Lorriane Shire 03/06/2021, 10:48 AM

## 2021-03-06 NOTE — Anesthesia Postprocedure Evaluation (Signed)
Anesthesia Post Note  Patient: Leroy [name]  Procedure(s) Performed: ENDOSCOPIC RETROGRADE CHOLANGIOPANCREATOGRAPHY (ERCP) WITH PROPOFOL (N/A ) REMOVAL OF STONES BIOPSY SPHINCTEROTOMY     Patient location during evaluation: PACU Anesthesia Type: General Level of consciousness: sedated Pain management: pain level controlled Vital Signs Assessment: post-procedure vital signs reviewed and stable Respiratory status: spontaneous breathing, respiratory function stable and patient connected to nasal cannula oxygen Cardiovascular status: stable Postop Assessment: no apparent nausea or vomiting Anesthetic complications: no   No complications documented.  Last Vitals:  Vitals:   03/06/21 1020 03/06/21 1305  BP: 124/74 140/79  Pulse: 65 65  Resp: 18 17  Temp: 36.4 C 36.6 C  SpO2: 97% 98%    Last Pain:  Vitals:   03/06/21 1305  TempSrc: Oral  PainSc:    Pain Goal:                   Merlinda Frederick

## 2021-03-06 NOTE — Progress Notes (Addendum)
Attending physician's note   I have taken an interval history, reviewed the chart and examined the patient. I agree with the Advanced Practitioner's note, impression, and recommendations as outlined.  - Plan for continued Plavix hold as outlined, to restart on 5/8 - Continue high-dose PPI as outlined - Okay to discharge home from a GI standpoint  Gerrit Heck, DO, [place] [phone] office                                                                      Daily Rounding Note  03/06/2021, 9:23 AM  LOS: 8 days   SUBJECTIVE:   Chief complaint: Choledocholithiasis.  S/p ERCP, stone extraction, sphincterotomy  Wife showed me some sputum the patient coughed up there is a small amount of blood-tinged to it.  Patient ate his breakfast well.  No abdominal pain.  No nausea or vomiting. Wife is concerned because the patient has not been out of bed since admission.  OBJECTIVE:         Vital signs in last 24 hours:    Temp:  [97.4 F (36.3 C)-98.6 F (37 C)] 97.5 F (36.4 C) (05/05 0607) Pulse Rate:  [65-84] 66 (05/05 0607) Resp:  [11-18] 17 (05/05 0607) BP: (136-177)/(83-99) 160/99 (05/05 0607) SpO2:  [93 %-100 %] 98 % (05/05 0607) Weight:  [100.7 kg] 100.7 kg (05/04 1420) Last BM Date: 03/04/21 Filed Weights   02/25/21 2043 03/05/21 1420  Weight: 100.7 kg 100.7 kg   General: NAD, comfortable. Heart: RRR Chest: No labored breathing or cough.  Did see the small amount of sputum containing small amount of of pale blood. Abdomen: Soft with intact, benign appearing surgical incisions.  Not tender.  Active bowel sounds. Extremities: No CCE. Neuro/Psych: Follows commands.  Confused but pleasant.  No tremors or involuntary movement.  Intake/Output from previous day: 05/04 0701 - 05/05 0700 In: 800 [I.V.:800] Out: 1900 [Urine:1900]  Intake/Output this shift: Total I/O In: 240 [P.O.:240] Out: 200 [Urine:200]  Lab Results: Recent Labs    03/04/21 0211 03/05/21 0235  03/06/21 0307  WBC 11.0* 11.3* 8.5  HGB 12.3* 11.3* 11.2*  HCT 36.2* 34.0* 33.5*  PLT 243 235 228   BMET Recent Labs    03/04/21 0211 03/05/21 0235 03/06/21 0307  NA 134* 136 137  K 3.7 3.5 3.6  CL 99 102 101  CO2 25 28 27   GLUCOSE 164* 102* 107*  BUN 19 24* 25*  CREATININE 1.56* 1.75* 1.63*  CALCIUM 8.8* 8.6* 8.7*   LFT Recent Labs    03/04/21 0211 03/05/21 0235 03/06/21 0307  PROT 6.4* 6.0* 6.2*  ALBUMIN 3.0* 2.8* 2.7*  AST 185* 185* 155*  ALT 185* 206* 201*  ALKPHOS 151* 126 127*  BILITOT 1.7* 1.7* 1.6*   PT/INR No results for input(s): LABPROT, INR in the last 72 hours. Hepatitis Panel No results for input(s): HEPBSAG, HCVAB, HEPAIGM, HEPBIGM in the last 72 hours.  Studies/Results: DG ERCP BILIARY & PANCREATIC DUCTS  Result Date: 03/06/2021 CLINICAL DATA:  Choledocholithiasis. EXAM: ERCP TECHNIQUE: Multiple spot images obtained with the fluoroscopic device and submitted for interpretation post-procedure. COMPARISON:  Intraoperative cholangiogram during laparoscopic cholecystectomy-03/03/2021 FLUOROSCOPY TIME:  3 minutes, 59 seconds (92.9 mGy) FINDINGS: Multiple spot fluoroscopic images the right upper  abdominal quadrant during ERCP are provided for review Initial image demonstrates an ERCP probe overlying the right upper abdominal quadrant. Cholecystectomy clips overlies expected location of the gallbladder fossa. Subsequent images demonstrate selective cannulation and opacification of the common bile duct which appears mildly dilated. Subsequent images demonstrate insufflation of a balloon within the mid aspect of the CBD with subsequent biliary sweeping and presumed sphincterotomy. There is minimal opacification intrahepatic biliary tree which appears nondilated. There is no definitive opacification of either the cystic or pancreatic ducts. IMPRESSION: ERCP with biliary sweeping and presumed sphincterotomy as above. These images were submitted for radiologic  interpretation only. Please see the procedural report for the amount of contrast and the fluoroscopy time utilized. Electronically Signed   By: Sandi Mariscal M.D.   On: 03/06/2021 08:29    ASSESMENT:   *    Choledocholithiasis. 03/05/2021 ERCP with sphincterotomy, stone extraction.  Gastric erythema noted and biopsied for H. Pylori. Home med list includes Protonix 40 mg/daily LFTs improved but not yet normalized.  *   Biliary pancreatitis.  *    Dementia.  *    chronic Plavix.   *   CKD.     PLAN   *   Okay to restart Plavix on 5/8. Uptitrate Protonix to 40 mg twice daily for 1 month and then resume once daily afterwards. GI will follow up the gastric biopsies, if H. pylori positive will add antibiotic regimen  *    no need for GI follow-up.  GI perspective okay to discharge home today.    Azucena Freed  03/06/2021, 9:23 AM Phone [phone]

## 2021-03-06 NOTE — Progress Notes (Signed)
Progress Note  1 Day Post-Op  Subjective: Sitting up in bed eating breakfast. He denies nausea, emesis, or pain. Wife is at bedside   Objective: Vital signs in last 24 hours: Temp:  [97.4 F (36.3 C)-98.6 F (37 C)] 97.5 F (36.4 C) (05/05 0607) Pulse Rate:  [65-84] 66 (05/05 0607) Resp:  [11-18] 17 (05/05 0607) BP: (136-177)/(83-99) 160/99 (05/05 0607) SpO2:  [93 %-100 %] 98 % (05/05 0607) Weight:  [100.7 kg] 100.7 kg (05/04 1420) Last BM Date: 03/04/21  Intake/Output from previous day: 05/04 0701 - 05/05 0700 In: 800 [I.V.:800] Out: 1900 [Urine:1900] Intake/Output this shift: No intake/output data recorded.  PE: General: pleasant, WD, WN male who is sitting up in bed in NAD HEENT: head is normocephalic, atraumatic. Sclera are anicteric.   Heart: regular, rate, and rhythm.   Lungs: CTAB, no wheezes, rhonchi, or rales noted.  Respiratory effort nonlabored Abd: soft, NT, ND, +BS, incisions with glue intact. No erythema or discharge MS: all 4 extremities are symmetrical with no cyanosis, clubbing, or edema.   Lab Results:  Recent Labs    03/05/21 0235 03/06/21 0307  WBC 11.3* 8.5  HGB 11.3* 11.2*  HCT 34.0* 33.5*  PLT 235 228   BMET Recent Labs    03/05/21 0235 03/06/21 0307  NA 136 137  K 3.5 3.6  CL 102 101  CO2 28 27  GLUCOSE 102* 107*  BUN 24* 25*  CREATININE 1.75* 1.63*  CALCIUM 8.6* 8.7*   PT/INR No results for input(s): LABPROT, INR in the last 72 hours. CMP     Component Value Date/Time   NA 137 03/06/2021 0307   K 3.6 03/06/2021 0307   CL 101 03/06/2021 0307   CO2 27 03/06/2021 0307   GLUCOSE 107 (H) 03/06/2021 0307   BUN 25 (H) 03/06/2021 0307   CREATININE 1.63 (H) 03/06/2021 0307   CALCIUM 8.7 (L) 03/06/2021 0307   PROT 6.2 (L) 03/06/2021 0307   ALBUMIN 2.7 (L) 03/06/2021 0307   AST 155 (H) 03/06/2021 0307   ALT 201 (H) 03/06/2021 0307   ALKPHOS 127 (H) 03/06/2021 0307   BILITOT 1.6 (H) 03/06/2021 0307   GFRNONAA 40 (L)  03/06/2021 0307   GFRAA 45 (L) 03/11/2020 0217   Lipase     Component Value Date/Time   LIPASE 51 03/04/2021 0211       Studies/Results: No results found.  Anti-infectives: Anti-infectives (From admission, onward)   Start     Dose/Rate Route Frequency Ordered Stop   02/26/21 1000  piperacillin-tazobactam (ZOSYN) IVPB 3.375 g  Status:  Discontinued        3.375 g 12.5 mL/hr over 240 Minutes Intravenous Every 8 hours 02/26/21 0456 03/04/21 1531   02/26/21 0000  ceFEPIme (MAXIPIME) 2 g in sodium chloride 0.9 % 100 mL IVPB        2 g 200 mL/hr over 30 Minutes Intravenous  Once 02/25/21 2351 02/26/21 0405   02/26/21 0000  metroNIDAZOLE (FLAGYL) IVPB 500 mg        500 mg 100 mL/hr over 60 Minutes Intravenous  Once 02/25/21 2351 02/26/21 8250       Assessment/Plan Hx of CVA/TIA - on plavix, LD 4/26 AM Hx of gastric ulcers HTN HLD CKD stage III Dementia Hiatal hernia  Left inguinal hernia - not incarcerated, can follow up outpatient to discuss elective repair   Biliary pancreatitis  S/p laparoscopic cholecystectomy with IOC 03/03/21 Dr. Bobbye Morton - POD#3 - ERCP 5/4 - diet as tolerated from  a surgical standpoint - ok to shower - follow up in office in 2-3 weeks scheduled - Hold plavix 72 hours post ERCP and hold pharmacologic VTE 48 hours post ERCP  Okay for discharge from a general surgery standpoint  FEN: advancing as tolerated VTE: SCDs ID: Zosyn 4/27>>  LOS: 8 days    Winferd Humphrey, [facility] Surgery 03/06/2021, 8:06 AM Please see Amion for pager number during day hours 7:00am-4:30pm

## 2021-03-06 NOTE — Progress Notes (Signed)
Daughter requesting diet advance this am.

## 2021-03-06 NOTE — TOC Initial Note (Signed)
Transition of Care [facility] - Initial/Assessment Note    Patient Details  Name: Leroy [name] MRN: [id number] Date of Birth: Sep 08, 1930  [facility]) CM/SW Contact:    Marilu Favre, RN Phone Number: 03/06/2021, 2:11 PM  Clinical Narrative:                 Spoke to patient and wife Leroy [name] at bedside.   Patient has DME. Agreeable to HHPT. Tommi Rumps with [facility] accepted referral  Expected Discharge Plan: [place] Barriers to Discharge: No Barriers Identified   Patient Goals and CMS Choice Patient states their goals for this hospitalization and ongoing recovery are:: to return tohome CMS Medicare.gov Compare Post Acute Care list provided to:: Patient Choice offered to / list presented to : [facility]  Expected Discharge Plan and Services Expected Discharge Plan: [place]   Discharge Planning Services: CM Consult Post Acute Care Choice: [place] arrangements for the past 2 months: Single Family Home Expected Discharge Date: 03/06/21                 DME Agency: NA       HH Arranged: PT HH Agency: [place] Date [facility] Agency Contacted: 03/06/21 Time HH Agency Contacted: 105 Representative spoke with at [place]: Tommi Rumps  Prior Living Arrangements/Services Living arrangements for the past 2 months: [place] Lives with:: Spouse Patient language and need for interpreter reviewed:: Yes Do you feel safe going back to the place where you live?: Yes      Need for Family Participation in Patient Care: Yes (Comment) Care giver support system in place?: Yes (comment) Current home services: DME Criminal Activity/Legal Involvement Pertinent to Current Situation/Hospitalization: No - Comment as needed  Activities of Daily Living Home Assistive Devices/Equipment: Walker (specify type),Shower chair with back ADL Screening (condition at time of admission) Patient's cognitive ability adequate to safely  complete daily activities?: No Is the patient deaf or have difficulty hearing?: No Does the patient have difficulty seeing, even when wearing glasses/contacts?: No Does the patient have difficulty concentrating, remembering, or making decisions?: Yes Patient able to express need for assistance with ADLs?: No Does the patient have difficulty dressing or bathing?: Yes Independently performs ADLs?: No Communication: Needs assistance Is this a change from baseline?: Pre-admission baseline Dressing (OT): Needs assistance Is this a change from baseline?: Pre-admission baseline Grooming: Needs assistance Is this a change from baseline?: Pre-admission baseline Feeding: Needs assistance Is this a change from baseline?: Pre-admission baseline Bathing: Needs assistance Is this a change from baseline?: Pre-admission baseline Toileting: Needs assistance Is this a change from baseline?: Pre-admission baseline In/Out Bed: Needs assistance Is this a change from baseline?: Pre-admission baseline Walks in Home: Needs assistance Is this a change from baseline?: Pre-admission baseline Does the patient have difficulty walking or climbing stairs?: Yes Weakness of Legs: Both Weakness of Arms/Hands: Both  Permission Sought/Granted   Permission granted to share information with : Yes, Verbal Permission Granted  Share Information with NAME: Leroy [name] wife           Emotional Assessment Appearance:: Appears stated age Attitude/Demeanor/Rapport: Engaged Affect (typically observed): Accepting Orientation: : Oriented to Self,Oriented to Place,Oriented to  Time,Oriented to Situation Alcohol / Substance Use: Not Applicable Psych Involvement: No (comment)  Admission diagnosis:  Acute pancreatitis [K85.90] Transaminitis [R74.01] Abdominal pain [R10.9] Acute pancreatitis, unspecified complication status, unspecified pancreatitis type [K85.90] Sepsis, due to unspecified organism, unspecified whether acute  organ dysfunction  present [facility]) [A41.9] Patient Active Problem List   Diagnosis Date Noted  . Choledocholithiasis   . Acute pancreatitis 02/26/2021  . Elevated LFTs 02/26/2021  . Acute cholecystitis 02/26/2021  . PAD (peripheral artery disease) ([place]) 06/21/2020  . Degenerative disc disease, lumbar 04/19/2020  . Hiatal hernia 04/19/2020  . Obesity 04/19/2020  . Upper GI bleed 04/19/2020  . Abdominal pain 04/19/2020  . Pain in limb 04/19/2020  . Carotid stenosis 04/19/2020  . TIA (transient ischemic attack) 03/10/2020  . Hyperlipidemia 03/10/2020  . HTN (hypertension) 03/10/2020  . Schatzki's ring 12/02/2018  . Constipation due to slow transit 10/06/2018  . Stool incontinence 10/06/2018  . 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 Vitamin D deficiency 06/22/2018  . Carpal tunnel syndrome, right 07/13/2017  . Diarrhea 04/20/2017  . Bursitis of shoulder, right 02/22/2017  . Rotator cuff tendinitis, right 02/22/2017  . B12 deficiency 10/12/2016  . Chronic kidney disease, stage 3b ([place]) 10/12/2016  . Lumbar stenosis with neurogenic claudication 08/12/2016  . IVCD (intraventricular conduction defect) 07/13/2016  . Preop cardiovascular exam 07/13/2016  . SOB (shortness of breath) on exertion 07/13/2016  . Polyneuropathy 05/25/2016  . Benign essential tremor 04/08/2016  . Leg weakness, bilateral 04/08/2016  . Hypertensive emergency 12/13/2015  . Nontraumatic intracerebral hemorrhage ([place]) 12/12/2015  . Acute intracerebral hemorrhage ([place]) 12/12/2015  . Anemia 06/25/2014   PCP:  Idelle Crouch, MD Pharmacy:   [facility], [place] [employer] [address] Phone: [phone] Fax: [place], [place] - [place] [address] [place] [address] Phone: [phone] Fax: [facility] #[phone]  [place], [place] - [address] [place] [address] Phone: [phone] Fax: [phone]     Social Determinants of Health (SDOH) Interventions    Readmission Risk Interventions No flowsheet data found.

## 2021-03-06 NOTE — Discharge Summary (Signed)
Physician Discharge Summary  Leroy [name] KVQ:[id number] DOB: 1929/12/15 DOA: 02/25/2021  PCP: Idelle Crouch, MD  Admit date: 02/25/2021 Discharge date: 03/06/2021  Admitted From: Home  Discharge disposition: Home with [facility]  Recommendations for Outpatient Follow-Up:   . Follow up with your primary care provider in one week.  . Check CBC, BMP, magnesium in the next visit . Follow-up with [facility] surgery as has been scheduled. . GI to follow-up with the biopsy taken from the gastric mucosa . To resume Plavix from 03/09/2021.  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  Diet recommendation: Low sodium, heart healthy.  Wound care: None.  Code status: DNR  History of Present Illness:   Patient is a 87 years old male with history of dementia,  CKD 3B, essential hypertension who initially presented to the hospital with acute onset of severe abdominal pain that started on 02/25/2021.  In the ED he was noted to have a lipase of 3570, abnormal liver function tests.  A CT scan of the abdomen and pelvis was concerning for distended gallbladder with wall thickening and cholelithiasis.  Patient was then admitted for acute gallstone pancreatitis.    Hospital Course:   Following conditions were addressed during hospitalization as listed below,  Acute gallstone pancreatitis with Cholelithiasis, choledocholithiasis  MRCP was performed which was concerning for cholelithiasis.    GI was consulted and due to abrupt truncation of distal CBD it was felt to be from pancreatic edema.  Patient was initially treated with bowel rest, supportive care and IV Zosyn.  Subsequently, general surgery was consulted and underwent a  laparoscopic cholecystectomy with intraoperative cholangiogram on 03/04/2021 with findings of filling defect in the common bile duct.  GI was reconsulted for  ERCP.  Patient underwent ERCP with stone extraction and sphincterotomy on 03/05/2021.  LFTs have improved but have not normalized.  Biopsy was taken from the gastric mucosa for erythema, pending.  GI and general surgery has seen the patient today and recommend discharge home with outpatient surgical follow-up.  CKD 3b Latest creatinine of 1.6.    Monitor as outpatient.   Dementia Remained stable.   History of CVA Patient will resume Plavix from 03/09/2021.  Spoke with the patient's wife about it.   Hypertension Continue Imdur, metoprolol on discharge.   Hypothyroidism Continue Synthroid   Debility, weakness.  Patient was seen by physical therapy who recommended home health PT on discharge.   Disposition.  At this time, patient is stable for disposition home with home health.  Patient was advised to follow-up with his primary care physician and general surgery which has been scheduled.  Spoke with the patient/spouse on the phone regarding the plan for disposition and follow-up  Medical Consultants:    GI  General surgery  Procedures:     Laparoscopic cholecystectomy with intraoperative cholangiogram  ERCP with sphincterectomy on 03/05/2021  Subjective:   Today, patient was seen and examined at bedside.  Denies any nausea, vomiting, fever, chills or rigor.  Discharge Exam:   Vitals:   03/06/21 1020 03/06/21 1305  BP: 124/74 140/79  Pulse: 65 65  Resp: 18 17  Temp: 97.6 F (36.4 C) 97.8 F (36.6 C)  SpO2: 97% 98%   Vitals:  03/06/21 0245 03/06/21 0607 03/06/21 1020 03/06/21 1305  BP: 139/83 (!) 160/99 124/74 140/79  Pulse: 67 66 65 65  Resp: 18 17 18 17   Temp: (!) 97.4 F (36.3 C) (!) 97.5 F (36.4 C) 97.6 F (36.4 C) 97.8 F (36.6 C)  TempSrc: Oral Oral Axillary Oral  SpO2: 99% 98% 97% 98%  Weight:       Height:       General: Alert awake, not in obvious distress HENT: pupils equally reacting to light,  No scleral pallor or icterus noted. Oral mucosa is moist.  Chest:  Clear breath sounds.  Diminished breath sounds bilaterally. No crackles or wheezes.  CVS: S1 &S2 heard. No murmur.  Regular rate and rhythm. Abdomen: Soft, nontender, nondistended.  Bowel sounds are heard.  Abdominal incisions with glue intact.  No erythema induration. Extremities: No cyanosis, clubbing or edema.  Peripheral pulses are palpable. Psych: Alert, awake and oriented, normal mood CNS:  No cranial nerve deficits.  Power equal in all extremities.   Skin: Warm and dry.  No rashes noted.  The results of significant diagnostics from this hospitalization (including imaging, microbiology, ancillary and laboratory) are listed below for reference.     Diagnostic Studies:   CT ABDOMEN PELVIS WO CONTRAST  Result Date: 02/25/2021 CLINICAL DATA:  Left lower quadrant abdominal pain. EXAM: CT ABDOMEN AND PELVIS WITHOUT CONTRAST TECHNIQUE: Multidetector CT imaging of the abdomen and pelvis was performed following the standard protocol without IV contrast. COMPARISON:  05/19/2006 FINDINGS: Lower chest: Emphysematous changes and mild interstitial fibrosis in the lung bases. Small esophageal hiatal hernia. Hepatobiliary: No focal liver lesions. Gallbladder is distended with evidence of gallbladder wall thickening and edema. No stones identified. Changes could indicate acute cholecystitis. No bile duct dilatation. Pancreas: Unremarkable. No pancreatic ductal dilatation or surrounding inflammatory changes. Spleen: Normal in size without focal abnormality. Adrenals/Urinary Tract: Adrenal glands are unremarkable. Kidneys are normal, without renal calculi, focal lesion, or hydronephrosis. Bladder is unremarkable. Stomach/Bowel: Stomach, small bowel, and colon are not abnormally distended. Scattered stool throughout the colon. No wall  thickening or inflammatory changes appreciated. Appendix is normal. There is a moderate-sized left inguinal hernia containing fat and sigmoid colon. No proximal colonic obstruction. Vascular/Lymphatic: Aortic atherosclerosis. No enlarged abdominal or pelvic lymph nodes. Reproductive: Prostate gland is mildly enlarged. Other: No free air or free fluid in the abdomen. Musculoskeletal: Prominent degenerative changes in the spine. No destructive bone lesions. IMPRESSION: 1. Distended gallbladder with gallbladder wall thickening and edema. No stones identified. Changes suggest acute cholecystitis. 2. Moderate-sized left inguinal hernia containing fat and sigmoid colon. No proximal colonic obstruction. 3. Emphysematous changes and mild interstitial fibrosis in the lung bases. 4. Small esophageal hiatal hernia. 5. Aortic atherosclerosis. 6. Mildly enlarged prostate gland. Aortic Atherosclerosis (ICD10-I70.0) and Emphysema (ICD10-J43.9). Electronically Signed   By: Lucienne Capers M.D.   On: 02/25/2021 23:32   MR ABDOMEN MRCP WO CONTRAST  Result Date: 02/26/2021 CLINICAL DATA:  Pancreatitis and suspected right upper quadrant abdominal pain. Left lower quadrant abdominal pain. EXAM: MRI ABDOMEN WITHOUT CONTRAST  (INCLUDING MRCP) TECHNIQUE: Multiplanar multisequence MR imaging of the abdomen was performed. Heavily T2-weighted images of the biliary and pancreatic ducts were obtained, and three-dimensional MRCP images were rendered by post processing. COMPARISON:  Multiple exams, including CT 02/25/2021 FINDINGS: Despite efforts by the technologist and patient, motion artifact is present on today's exam and could not be eliminated. This reduces exam sensitivity and specificity. Lower chest: Mild cardiomegaly. Hepatobiliary: Mild periportal edema. Substantial gallbladder  wall thickening about 0.6 cm. Tiny dependent filling defects are present in the common bile duct for example on image 23 of series 8 and image 24 series 4.  Appearance compatible with either debris or tiny stones. Similarly there is some subtle dependent multifocal filling defect along the dependent wall of the gallbladder on image 20 series 4 again compatible with debris or tiny gallstones. The common bile duct measures up to 0.9 cm in diameter, borderline prominent for the patient's age. Slightly abrupt truncation of the distal CBD on image 22 series 5. Pancreas: Borderline prominence of the dorsal pancreatic duct in the pancreatic head and body. Spleen:  Unremarkable Adrenals/Urinary Tract:  Unremarkable Stomach/Bowel: Small type 1 hiatal hernia. Vascular/Lymphatic: Aortoiliac atherosclerotic vascular disease. Borderline enlarged right gastric, celiac, porta hepatis, and peripancreatic lymph nodes. An index right gastric node measures 0.8 cm in short axis on image 15 series 8. Portacaval node 1.2 cm in short axis, image 20 series 8. Other: Mild perihepatic ascites. Low-level T2 hyperintense stranding/edema along the porta hepatis and descending duodenum. Musculoskeletal: The lumbar spondylosis and degenerative disc disease with mild levoconvex lumbar scoliosis. Postoperative findings in the lower lumbar spine. IMPRESSION: 1. Tiny stones or less likely debris in the thick-walled gallbladder and to a lesser extent in the borderline dilated common bile duct. There is some mild abrupt truncation of the distal CBD which could possibly be related to choledocholithiasis. Perihepatic ascites. If the clinical picture of whether the patient has cholecystitis is still uncertain, nuclear medicine hepatobiliary scan can assess patency of the cystic duct. 2. Mild nonspecific periportal edema. 3. Upper normal/borderline prominent right gastric, porta hepatis, and peripancreatic lymph nodes. Likely reactive. 4.  Aortic Atherosclerosis (ICD10-I70.0). 5. Lumbar spondylosis and degenerative disc disease along with postoperative findings in the lumbar spine. 6. Mild cardiomegaly. 7.  Hiatal hernia. Electronically Signed   By: Van Clines M.D.   On: 02/26/2021 08:14   MR 3D Recon At Scanner  Result Date: 02/26/2021 CLINICAL DATA:  Pancreatitis and suspected right upper quadrant abdominal pain. Left lower quadrant abdominal pain. EXAM: MRI ABDOMEN WITHOUT CONTRAST  (INCLUDING MRCP) TECHNIQUE: Multiplanar multisequence MR imaging of the abdomen was performed. Heavily T2-weighted images of the biliary and pancreatic ducts were obtained, and three-dimensional MRCP images were rendered by post processing. COMPARISON:  Multiple exams, including CT 02/25/2021 FINDINGS: Despite efforts by the technologist and patient, motion artifact is present on today's exam and could not be eliminated. This reduces exam sensitivity and specificity. Lower chest: Mild cardiomegaly. Hepatobiliary: Mild periportal edema. Substantial gallbladder wall thickening about 0.6 cm. Tiny dependent filling defects are present in the common bile duct for example on image 23 of series 8 and image 24 series 4. Appearance compatible with either debris or tiny stones. Similarly there is some subtle dependent multifocal filling defect along the dependent wall of the gallbladder on image 20 series 4 again compatible with debris or tiny gallstones. The common bile duct measures up to 0.9 cm in diameter, borderline prominent for the patient's age. Slightly abrupt truncation of the distal CBD on image 22 series 5. Pancreas: Borderline prominence of the dorsal pancreatic duct in the pancreatic head and body. Spleen:  Unremarkable Adrenals/Urinary Tract:  Unremarkable Stomach/Bowel: Small type 1 hiatal hernia. Vascular/Lymphatic: Aortoiliac atherosclerotic vascular disease. Borderline enlarged right gastric, celiac, porta hepatis, and peripancreatic lymph nodes. An index right gastric node measures 0.8 cm in short axis on image 15 series 8. Portacaval node 1.2 cm in short axis, image 20 series 8. Other:  Mild perihepatic ascites.  Low-level T2 hyperintense stranding/edema along the porta hepatis and descending duodenum. Musculoskeletal: The lumbar spondylosis and degenerative disc disease with mild levoconvex lumbar scoliosis. Postoperative findings in the lower lumbar spine. IMPRESSION: 1. Tiny stones or less likely debris in the thick-walled gallbladder and to a lesser extent in the borderline dilated common bile duct. There is some mild abrupt truncation of the distal CBD which could possibly be related to choledocholithiasis. Perihepatic ascites. If the clinical picture of whether the patient has cholecystitis is still uncertain, nuclear medicine hepatobiliary scan can assess patency of the cystic duct. 2. Mild nonspecific periportal edema. 3. Upper normal/borderline prominent right gastric, porta hepatis, and peripancreatic lymph nodes. Likely reactive. 4.  Aortic Atherosclerosis (ICD10-I70.0). 5. Lumbar spondylosis and degenerative disc disease along with postoperative findings in the lumbar spine. 6. Mild cardiomegaly. 7. Hiatal hernia. Electronically Signed   By: Van Clines M.D.   On: 02/26/2021 08:14   US Abdomen Limited RUQ (LIVER/GB)  Result Date: 02/26/2021 CLINICAL DATA:  Abdominal pain EXAM: ULTRASOUND ABDOMEN LIMITED RIGHT UPPER QUADRANT COMPARISON:  CT 02/25/2021 FINDINGS: Gallbladder: Diffuse gallbladder wall thickening, with gallbladder wall thickness measuring 9.3 mm. No gallstones are demonstrated. Murphy's sign is negative. Gallbladder wall thickening is nonspecific and could be due to cholecystitis, liver disease, or adenomyosis. Common bile duct: Diameter: 6.3 mm, upper limits of normal Liver: No focal lesion identified. Within normal limits in parenchymal echogenicity. Portal vein is patent on color Doppler imaging with normal direction of blood flow towards the liver. Other: None. IMPRESSION: Diffuse gallbladder wall thickening, nonspecific. No cholelithiasis. Negative Murphy's sign. Electronically Signed    By: Lucienne Capers M.D.   On: 02/26/2021 00:20     Labs:   Basic Metabolic Panel: Recent Labs  Lab 03/02/21 0134 03/03/21 0031 03/04/21 0211 03/05/21 0235 03/06/21 0307  NA 137 136 134* 136 137  K 3.3* 3.6 3.7 3.5 3.6  CL 101 101 99 102 101  CO2 24 26 25 28 27   GLUCOSE 107* 97 164* 102* 107*  BUN 21 19 19  24* 25*  CREATININE 1.60* 1.59* 1.56* 1.75* 1.63*  CALCIUM 8.8* 9.0 8.8* 8.6* 8.7*  MG 1.9  --   --   --  2.0  PHOS 3.5  --   --   --  4.4   GFR Estimated Creatinine Clearance: 39.1 mL/min (A) (by C-G formula based on SCr of 1.63 mg/dL (H)). Liver Function Tests: Recent Labs  Lab 03/02/21 0134 03/03/21 0031 03/04/21 0211 03/05/21 0235 03/06/21 0307  AST 102* 110* 185* 185* 155*  ALT 129* 128* 185* 206* 201*  ALKPHOS 146* 140* 151* 126 127*  BILITOT 2.4* 1.8* 1.7* 1.7* 1.6*  PROT 6.1* 6.2* 6.4* 6.0* 6.2*  ALBUMIN 2.8* 2.8* 3.0* 2.8* 2.7*   Recent Labs  Lab 03/02/21 0134 03/04/21 0211  LIPASE 85* 51   No results for input(s): AMMONIA in the last 168 hours. Coagulation profile No results for input(s): INR, PROTIME in the last 168 hours.  CBC: Recent Labs  Lab 03/02/21 0134 03/03/21 0031 03/04/21 0211 03/05/21 0235 03/06/21 0307  WBC 5.8 6.0 11.0* 11.3* 8.5  HGB 11.9* 12.0* 12.3* 11.3* 11.2*  HCT 35.4* 35.8* 36.2* 34.0* 33.5*  MCV 90.8 89.5 88.9 90.9 91.3  PLT 201 219 243 235 228   Cardiac Enzymes: No results for input(s): CKTOTAL, CKMB, CKMBINDEX, TROPONINI in the last 168 hours. BNP: Invalid input(s): POCBNP CBG: No results for input(s): GLUCAP in the last 168 hours. D-Dimer No results for  input(s): DDIMER in the last 72 hours. Hgb A1c No results for input(s): HGBA1C in the last 72 hours. Lipid Profile No results for input(s): CHOL, HDL, LDLCALC, TRIG, CHOLHDL, LDLDIRECT in the last 72 hours. Thyroid function studies No results for input(s): TSH, T4TOTAL, T3FREE, THYROIDAB in the last 72 hours.  Invalid input(s): FREET3 Anemia work  up No results for input(s): VITAMINB12, FOLATE, FERRITIN, TIBC, IRON, RETICCTPCT in the last 72 hours. Microbiology Recent Results (from the past 240 hour(s))  Resp Panel by RT-PCR (Flu A&B, Covid) Nasopharyngeal Swab     Status: None   Collection Time: 02/25/21 11:44 PM   Specimen: Nasopharyngeal Swab; Nasopharyngeal(NP) swabs in vial transport medium  Result Value Ref Range Status   SARS Coronavirus 2 by RT PCR NEGATIVE NEGATIVE Final    Comment: (NOTE) SARS-CoV-2 target nucleic acids are NOT DETECTED.  The SARS-CoV-2 RNA is generally detectable in upper respiratory specimens during the acute phase of infection. The lowest concentration of SARS-CoV-2 viral copies this assay can detect is 138 copies/mL. A negative result does not preclude SARS-Cov-2 infection and should not be used as the sole basis for treatment or other patient management decisions. A negative result may occur with  improper specimen collection/handling, submission of specimen other than nasopharyngeal swab, presence of viral mutation(s) within the areas targeted by this assay, and inadequate number of viral copies(<138 copies/mL). A negative result must be combined with clinical observations, patient history, and epidemiological information. The expected result is Negative.  Fact Sheet for Patients:  EntrepreneurPulse.com.au  Fact Sheet for Healthcare Providers:  IncredibleEmployment.be  This test is no t yet approved or cleared by the [place] FDA and  has been authorized for detection and/or diagnosis of SARS-CoV-2 by FDA under an Emergency Use Authorization (EUA). This EUA will remain  in effect (meaning this test can be used) for the duration of the COVID-19 declaration under Section 564(b)(1) of the Act, 21 U.S.C.section 360bbb-3(b)(1), unless the authorization is terminated  or revoked sooner.       Influenza A by PCR NEGATIVE NEGATIVE Final   Influenza B by  PCR NEGATIVE NEGATIVE Final    Comment: (NOTE) The Xpert Xpress SARS-CoV-2/FLU/RSV plus assay is intended as an aid in the diagnosis of influenza from Nasopharyngeal swab specimens and should not be used as a sole basis for treatment. Nasal washings and aspirates are unacceptable for Xpert Xpress SARS-CoV-2/FLU/RSV testing.  Fact Sheet for Patients: EntrepreneurPulse.com.au  Fact Sheet for Healthcare Providers: IncredibleEmployment.be  This test is not yet approved or cleared by the [place] FDA and has been authorized for detection and/or diagnosis of SARS-CoV-2 by FDA under an Emergency Use Authorization (EUA). This EUA will remain in effect (meaning this test can be used) for the duration of the COVID-19 declaration under Section 564(b)(1) of the Act, 21 U.S.C. section 360bbb-3(b)(1), unless the authorization is terminated or revoked.  Performed at [facility], [place] [address], [place], [place] [address]   Urine culture     Status: Abnormal   Collection Time: 02/26/21 12:15 AM   Specimen: Urine, Random  Result Value Ref Range Status   Specimen Description URINE, RANDOM  Final   Special Requests NONE  Final   Culture (A)  Final    <10,000 COLONIES/mL INSIGNIFICANT GROWTH Performed at [facility], [place] [address], [place], [place] [address]    Report Status 02/27/2021 FINAL  Final  Blood culture (routine x 2)     Status: None   Collection  Time: 02/26/21 12:32 AM   Specimen: BLOOD RIGHT FOREARM  Result Value Ref Range Status   Specimen Description BLOOD RIGHT FOREARM  Final   Special Requests   Final    BOTTLES DRAWN AEROBIC AND ANAEROBIC Blood Culture results may not be optimal due to an inadequate volume of blood received in culture bottles   Culture   Final    NO GROWTH 5 DAYS Performed at [facility], [place] [address] [place], [place] [address]    Report Status 03/03/2021 FINAL  Final  Blood culture (routine  x 2)     Status: None   Collection Time: 02/26/21 12:40 AM   Specimen: BLOOD RIGHT HAND  Result Value Ref Range Status   Specimen Description BLOOD RIGHT HAND  Final   Special Requests   Final    BOTTLES DRAWN AEROBIC AND ANAEROBIC Blood Culture results may not be optimal due to an inadequate volume of blood received in culture bottles   Culture   Final    NO GROWTH 5 DAYS Performed at [facility], [place] [address], [place], [place] [address]    Report Status 03/03/2021 FINAL  Final  Surgical pcr screen     Status: None   Collection Time: 02/26/21  9:12 PM   Specimen: Nasal Mucosa; Nasal Swab  Result Value Ref Range Status   MRSA, PCR NEGATIVE NEGATIVE Final   Staphylococcus aureus NEGATIVE NEGATIVE Final    Comment: (NOTE) The Xpert SA Assay (FDA approved for NASAL specimens in patients 86 years of age and older), is one component of a comprehensive surveillance program. It is not intended to diagnose infection nor to guide or monitor treatment. Performed at [facility], [place] [address], [place], [place] [address]      Discharge Instructions:   Discharge Instructions     Call MD for:  persistant nausea and vomiting   Complete by: As directed    Call MD for:  severe uncontrolled pain   Complete by: As directed    Call MD for:  temperature >100.4   Complete by: As directed    Diet - low sodium heart healthy   Complete by: As directed    Discharge instructions   Complete by: As directed    Follow-up with your primary care physician in 1 week.  Take medications as prescribed.  Start Plavix from 03/09/2021.  Follow-up with surgical clinic on 03/20/2021 at 1:30 PM.   Increase activity slowly   Complete by: As directed    No wound care   Complete by: As directed       Allergies as of 03/06/2021       Reactions   Aspirin Other (See Comments)   He has bleeding ulcers        Medication List     TAKE these medications    acetaminophen 500 MG  tablet Commonly known as: TYLENOL Take 500 mg by mouth daily as needed for moderate pain or headache.   cholecalciferol 1000 units tablet Commonly known as: VITAMIN D Take 2,000 Units by mouth daily.   clopidogrel 75 MG tablet Commonly known as: PLAVIX Take 1 tablet (75 mg total) by mouth daily. Start taking on: Mar 09, 2021 What changed: These instructions start on Mar 09, 2021. If you are unsure what to do until then, ask your doctor or other care provider.   cyanocobalamin 1000 MCG/ML injection Commonly known as: (VITAMIN B-12) Inject 1,000 mcg into the muscle every 30 (  thirty) days.   ezetimibe 10 MG tablet Commonly known as: ZETIA Take 10 mg by mouth at bedtime.   isosorbide mononitrate 30 MG 24 hr tablet Commonly known as: IMDUR Take 30 mg by mouth daily.   levothyroxine 50 MCG tablet Commonly known as: SYNTHROID Take 50 mcg by mouth daily.   meclizine 12.5 MG tablet Commonly known as: ANTIVERT Take 12.5 mg by mouth 3 (three) times daily as needed for dizziness.   metoprolol succinate 25 MG 24 hr tablet Commonly known as: TOPROL-XL Take 12.5 mg by mouth in the morning.   ondansetron 4 MG tablet Commonly known as: ZOFRAN Take 1 tablet (4 mg total) by mouth every 6 (six) hours as needed for nausea.   pantoprazole 40 MG tablet Commonly known as: PROTONIX Take 1 tablet (40 mg total) by mouth 2 (two) times daily before a meal for 30 days, THEN 1 tablet (40 mg total) daily. Start taking on: Mar 06, 2021 What changed: See the new instructions.   sertraline 25 MG tablet Commonly known as: ZOLOFT Take 25 mg by mouth daily.        Follow-up [facility], [place]. Call.   Specialty: General Surgery Why: Follow up appointment on 03/20/21 at 2 pm arriving at 1:30 PM.  Please bring ID and insurance card Contact information: [address] [place]        Idelle Crouch, MD.  Schedule an appointment as soon as possible for a visit in 1 week(s).   Specialty: Internal Medicine Why: Regular checkup, blood work. Contact information: [address] [place] [address] [phone]                  Time coordinating discharge: 39 minutes  Signed:  Ozzy Bohlken  [facility] 03/06/2021, 1:43 PM

## 2021-03-07 LAB — SURGICAL PATHOLOGY

## 2021-03-12 ENCOUNTER — Encounter: Payer: Self-pay | Admitting: Gastroenterology

## 2021-03-18 ENCOUNTER — Other Ambulatory Visit: Payer: Medicare Other | Admitting: Nurse Practitioner

## 2021-03-18 ENCOUNTER — Encounter: Payer: Self-pay | Admitting: Nurse Practitioner

## 2021-03-18 ENCOUNTER — Other Ambulatory Visit: Payer: Self-pay

## 2021-03-18 DIAGNOSIS — R5381 Other malaise: Secondary | ICD-10-CM

## 2021-03-18 DIAGNOSIS — F0391 Unspecified dementia with behavioral disturbance: Secondary | ICD-10-CM

## 2021-03-18 DIAGNOSIS — Z515 Encounter for palliative care: Secondary | ICD-10-CM

## 2021-03-18 NOTE — Progress Notes (Signed)
Therapist, nutritional Palliative Care Consult Note Telephone: [phone]  Fax: [phone]    Date of encounter: 03/18/21 PATIENT NAME: Leroy [name] [address] [place] [address]   [id number] (home)  DOB: [birth year] MRN: [id number] PRIMARY CARE PROVIDER:    Marguarite Arbour, MD,  [address] [place] [address] [phone]  RESPONSIBLE PARTY:    Contact Information    Name Relation Home Work Mobile   [place] Spouse [phone]     Jacklynn Barnacle Daughter [phone]  [phone]   Lowella Dandy Daughter   [phone]     I met face to face with patient and family, daughter, [name] and wife Nettie Elm in their  home. Palliative Care was asked to follow this patient by consultation request of  Sparks, Duane Lope, MD to address advance care planning and complex medical decision making. This is the initial visit.   ASSESSMENT AND PLAN / RECOMMENDATIONS:  Symptom Management/Plan: 1.Advance Care Planning;DNR in [place]yn[place]; will revisit at next [facility] visit MOST form  2. Goals of Care: Goals include to maximize quality of life and symptom management. Our advance care planning conversation included a discussion about:   The value and importance of advance care planning  Exploration of personal, cultural or spiritual beliefs that might influence medical decisions  Exploration of goals of care in the event of a sudden injury or illness  Identification and preparation of a healthcare agent  Review and updating or creation of anadvance directive document.  3.Palliative care encounter; Palliative care encounter; Palliative medicine team will continue to support patient, patient's family, and medical team. Visit consisted of counseling and education dealing with the complex and emotionally intense issues of symptom management and palliative care in the setting of serious and potentially  life-threatening illness  4. Debility secondary to cholangiogram on 03/04/2021 with findings of filling defect in the common bile duct. GI He consulted for ERCP underwent with stone extraction and sphincterotomy on 03/05/2021/dementia,  Continue to encourage mobility, monitor fall risk.   5. Memory loss secondary to dementia, progressive, worsen since hospitalization. Continue supportive role, monitoring for decline  Follow up Palliative Care Visit: Palliative care will continue to follow for complex medical decision making, advance care planning, and clarification of goals. Return 8 weeks or prn.  I spent 60 minutes providing this consultation. More than 50% of the time in this consultation was spent in counseling and care coordination.  PPS: 40%  HOSPICE ELIGIBILITY/DIAGNOSIS: TBD  Chief Complaint: Initial palliative consult following hospitalization for complex medical decision making  HISTORY OF PRESENT ILLNESS:  Leroy [name] is a 87 y.o. year old male  with Dementia, intraventricular conduction defect, Stroke, peripheral vascular disease, obesity, hypertension, hyperlipidemia, chronic kidney disease, anemia, skin cancer, Tremor, vitamin D deficiency, vitamin B12 deficiency, hiatal hernia, history of gastric ulcer, lumbar laminectomy with decompression microdiscectomy, eye surgery. Seen in the emergency department 3/5 / 2022 to 3/6 / 2022 with generalized weakness and dizziness with MRI - for stroke show chronic microhemorrhages. Hospitalized 02/25/2021 to 03/06/2021 for acute pancreatitis with cholelithiasis, choledocholithiasis with MRCP Performed with GI consulted due to abrupt truncation of distal CBD It was felt to be from pancreatic edema treated with bowel rest, supportive care, zosyn with general surgery consulted underwent a laparoscopic cholecystectomy with interoperative cholangiogram on 03/04/2021 with findings of filling defect in the common bile duct. GI He consulted for ERCP underwent  with stone extraction and sphincterotomy on 03/05/2021. LFTS improved with biopsies taken  from gastric mucosa for erythema. I visited Leroy [name] and his home, called Leroy [name] his daughter prior to confirm palliative care visit with COVID screening negative.Leroy [name] Leroy [name] daughter, Leroy [name] his wife and Leroy [name] were sitting in the living room period we talked about purpose or palliative care visit. We talked about recent hospitalization. We talked about symptoms of pain, shortness of breath which Leroy [name] denies. We talked about his appetite which is very good. Leroy [name] has lost about 10 pounds though with recent hospitalization likely expected. Leroy [name] endorses he has been sleeping well. We talked about cognitive changes mildly as he is forgetful. Leroy [name] endorses that at the last visit with Dr Brigitte Pulse neurology that he has no longer been allowed to drive. Leroy [name] endorses Leroy [name] became irritable at [place] when she would not allow him to drive earlier today to the dump to take the trash. This has been his usual routine prior to hospitalization. We talked about home health working with Leroy [name] including physical therapy doing well. Leroy [name] does ambulate initially when he came home with a walker but has since been able to ambulate independently. Leroy [name] endorses this morning Leroy [name] did give himself a bath. We talked about overall cognitive changes. We talked about medical goals of care. Discussed at present time Leroy [name] continues to improve. Leroy [name] and Leroy [name] endorse no new concerns. Leroy [name] endorses when they had the follow up visit with Dr Doy Hutching after hospitalization they completed lab work and plain films. Reviewed labs though plain film results not available at present time period encourage Leroy [name] to contact Dr sparks office for results. We talked about bowel regimen which has been regular. We talked about role of palliative care in plan of care period we talked about the things that Leroy [name] likes to do  including watching his westerns and Darryl Nestle show Leroy [name] endorses that is normally not until the evening time period during the day he does walk around in the rooms and at times goes outside to the [place]. The carport, every time the nest is taken down the birds return. We talked about follow up palliative care visit in two months if needed or sooner should he decline.Vernice Jefferson Leroy [name] all in agreement, appointment scheduled. Therapeutic listening and emotional support provided. questions answered to satisfaction  History obtained from review of EMR, discussion with primary team, and interview with family, facility staff/caregiver and/or Leroy. Housholder.  I reviewed available labs, medications, imaging, studies and related documents from the EMR.  Records reviewed and summarized above.   ROS Full 14 system review of systems performed and negative with exception of: as per HPI.  Physical Exam: Constitutional: NAD General: frail appearing, weak, pleasant male EYES:  lids intact ENMT: oral mucous membranes moist CV: S1S2, RRR, no LE edema Pulmonary: LCTA, no increased work of breathing, no cough, room air Abdomen: intake 100%, normo-active BS + 4 quadrants, soft and non tender GU: deferred MSK: , moves all extremities, ambulatory Skin: warm and dry, no rashes or wounds on visible skin Neuro:  + generalized weakness,  + cognitive impairment Psych: non-anxious affect, A and O x 3, forgetful  CURRENT PROBLEM LIST:  Patient Active Problem List   Diagnosis Date Noted  . Choledocholithiasis   . Acute pancreatitis 02/26/2021  . Elevated LFTs 02/26/2021  . Acute cholecystitis 02/26/2021  . PAD (peripheral artery disease) ([place]) 06/21/2020  . Degenerative disc disease, lumbar 04/19/2020  . Hiatal hernia 04/19/2020  . Obesity 04/19/2020  .  Upper GI bleed 04/19/2020  . Abdominal pain 04/19/2020  . Pain in limb 04/19/2020  . Carotid stenosis 04/19/2020  . TIA (transient ischemic attack)  03/10/2020  . Hyperlipidemia 03/10/2020  . HTN (hypertension) 03/10/2020  . Schatzki's ring 12/02/2018  . Constipation due to slow transit 10/06/2018  . Stool incontinence 10/06/2018  . 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 Vitamin D deficiency 06/22/2018  . Carpal tunnel syndrome, right 07/13/2017  . Diarrhea 04/20/2017  . Bursitis of shoulder, right 02/22/2017  . Rotator cuff tendinitis, right 02/22/2017  . B12 deficiency 10/12/2016  . Chronic kidney disease, stage 3b (HCC) 10/12/2016  . Lumbar stenosis with neurogenic claudication 08/12/2016  . IVCD (intraventricular conduction defect) 07/13/2016  . Preop cardiovascular exam 07/13/2016  . SOB (shortness of breath) on exertion 07/13/2016  . Polyneuropathy 05/25/2016  . Benign essential tremor 04/08/2016  . Leg weakness, bilateral 04/08/2016  . Hypertensive emergency 12/13/2015  . Nontraumatic intracerebral hemorrhage (HCC) 12/12/2015  . Acute intracerebral hemorrhage (HCC) 12/12/2015  . Anemia 06/25/2014   PAST MEDICAL HISTORY:  Active Ambulatory Problems    Diagnosis Date Noted  . Nontraumatic intracerebral hemorrhage (HCC) 12/12/2015  . Acute intracerebral hemorrhage (HCC) 12/12/2015  . Hypertensive emergency 12/13/2015  . Lumbar stenosis with neurogenic claudication 08/12/2016  . Diarrhea 04/20/2017  . TIA (transient ischemic attack) 03/10/2020  . Hyperlipidemia 03/10/2020  . HTN (hypertension) 03/10/2020  . Anemia 06/25/2014  . B12 deficiency 10/12/2016  . Benign essential tremor 04/08/2016  . Carpal tunnel syndrome, right 07/13/2017  . Bursitis of shoulder, right 02/22/2017  . Chronic kidney disease, stage 3b (HCC) 10/12/2016  . Constipation due to slow transit 10/06/2018  . Degenerative disc disease, lumbar 04/19/2020  . Hiatal hernia 04/19/2020  . IVCD (intraventricular conduction defect) 07/13/2016  . Leg weakness, bilateral 04/08/2016  . Mixed Alzheimer's and  vascular dementia with behavior disturbances (HCC) 09/22/2018  . Obesity 04/19/2020  . Polyneuropathy 05/25/2016  . Preop cardiovascular exam 07/13/2016  . Rotator cuff tendinitis, right 02/22/2017  . Schatzki's ring 12/02/2018  . SOB (shortness of breath) on exertion 07/13/2016  . Stool incontinence 10/06/2018  . Upper GI bleed 04/19/2020  . Vitamin D deficiency 06/22/2018  . Abdominal pain 04/19/2020  . Pain in limb 04/19/2020  . Carotid stenosis 04/19/2020  . PAD (peripheral artery disease) (HCC) 06/21/2020  . Acute pancreatitis 02/26/2021  . Elevated LFTs 02/26/2021  . Acute cholecystitis 02/26/2021  . Choledocholithiasis    Resolved Ambulatory Problems    Diagnosis Date Noted  . No Resolved Ambulatory Problems   Past Medical History:  Diagnosis Date  . Bursitis   . Cancer (HCC)   . Chronic kidney disease   . Dementia (HCC)   . Dyspnea   . GI bleed   . History of hiatal hernia   . Hypertension   . Peripheral vascular disease (HCC)   . Stomach ulcer   . Stroke (HCC)   . Tremor   . Vitamin B 12 deficiency    SOCIAL HX:  Social History   Tobacco Use  . Smoking status: Never Smoker  . Smokeless tobacco: Current User    Types: Chew  Substance Use Topics  . Alcohol use: No   FAMILY HX:  Family History  Problem Relation Age of Onset  . CAD Mother   . Cancer Father     Reviewed  ALLERGIES:  Allergies  Allergen Reactions  . Aspirin Other (See Comments)    He has bleeding ulcers  PERTINENT MEDICATIONS:  Outpatient Encounter Medications as of 03/18/2021  Medication Sig  . acetaminophen (TYLENOL) 500 MG tablet Take 500 mg by mouth daily as needed for moderate pain or headache.  . cholecalciferol (VITAMIN D) 1000 units tablet Take 2,000 Units by mouth daily.   . clopidogrel (PLAVIX) 75 MG tablet Take 1 tablet (75 mg total) by mouth daily.  . cyanocobalamin (,VITAMIN B-12,) 1000 MCG/ML injection Inject 1,000 mcg into the muscle every 30 (thirty) days.  Marland Kitchen  ezetimibe (ZETIA) 10 MG tablet Take 10 mg by mouth at bedtime.  . isosorbide mononitrate (IMDUR) 30 MG 24 hr tablet Take 30 mg by mouth daily.  Marland Kitchen levothyroxine (SYNTHROID) 50 MCG tablet Take 50 mcg by mouth daily.  . meclizine (ANTIVERT) 12.5 MG tablet Take 12.5 mg by mouth 3 (three) times daily as needed for dizziness.  . metoprolol succinate (TOPROL-XL) 25 MG 24 hr tablet Take 12.5 mg by mouth in the morning.   . ondansetron (ZOFRAN) 4 MG tablet Take 1 tablet (4 mg total) by mouth every 6 (six) hours as needed for nausea.  . pantoprazole (PROTONIX) 40 MG tablet Take 1 tablet (40 mg total) by mouth 2 (two) times daily before a meal for 30 days, THEN 1 tablet (40 mg total) daily.  . sertraline (ZOLOFT) 25 MG tablet Take 25 mg by mouth daily.   No facility-administered encounter medications on file as of 03/18/2021.  Questions and concerns were addressed. The patient/family was encouraged to call with questions and/or concerns. My contact information was provided. Provided general support and encouragement, no other unmet needs identified  Thank you for the opportunity to participate in the care of Leroy. Katzenstein.  The palliative care team will continue to follow. Please call our office at [phone] if we can be of additional assistance  This chart was dictated using voice recognition software. Despite best efforts to proofread, errors can occur which can change the documentation meaning.   Rhyann Berton Z Roena Sassaman, NP , MSN,  ACHPN  COVID-19 PATIENT SCREENING TOOL Asked and negative response unless otherwise noted:   Have you had symptoms of covid, tested positive or been in contact with someone with symptoms/positive test in the past 5-10 days? NO

## 2021-04-24 ENCOUNTER — Other Ambulatory Visit: Payer: Self-pay

## 2021-04-24 ENCOUNTER — Encounter: Payer: Self-pay | Admitting: Dermatology

## 2021-04-24 ENCOUNTER — Ambulatory Visit: Payer: Medicare HMO | Admitting: Dermatology

## 2021-04-24 ENCOUNTER — Telehealth: Payer: Self-pay

## 2021-04-24 DIAGNOSIS — Z85828 Personal history of other malignant neoplasm of skin: Secondary | ICD-10-CM

## 2021-04-24 DIAGNOSIS — L570 Actinic keratosis: Secondary | ICD-10-CM | POA: Diagnosis not present

## 2021-04-24 DIAGNOSIS — D1800 Hemangioma unspecified site: Secondary | ICD-10-CM

## 2021-04-24 DIAGNOSIS — L578 Other skin changes due to chronic exposure to nonionizing radiation: Secondary | ICD-10-CM

## 2021-04-24 DIAGNOSIS — D692 Other nonthrombocytopenic purpura: Secondary | ICD-10-CM | POA: Diagnosis not present

## 2021-04-24 DIAGNOSIS — Z1283 Encounter for screening for malignant neoplasm of skin: Secondary | ICD-10-CM

## 2021-04-24 DIAGNOSIS — L821 Other seborrheic keratosis: Secondary | ICD-10-CM

## 2021-04-24 DIAGNOSIS — D225 Melanocytic nevi of trunk: Secondary | ICD-10-CM

## 2021-04-24 DIAGNOSIS — L814 Other melanin hyperpigmentation: Secondary | ICD-10-CM

## 2021-04-24 DIAGNOSIS — D229 Melanocytic nevi, unspecified: Secondary | ICD-10-CM

## 2021-04-24 NOTE — Telephone Encounter (Signed)
Discussed with patient's wife that after reviewing his medical history Dr. Laurence Ferrari doesn't recommend starting Niacinamide at this time. Wife had no further questions.

## 2021-04-24 NOTE — Patient Instructions (Addendum)
If you have any questions or concerns for your doctor, please call our main line at [phone] and press option 4 to reach your doctor's medical assistant. If no one answers, please leave a voicemail as directed and we will return your call as soon as possible. Messages left after 4 pm will be answered the following business day.   You may also send [place] a message via [place]. We typically respond to MyChart messages within 1-2 business days.  For prescription refills, please ask your pharmacy to contact our office. Our fax number is [phone].  If you have an urgent issue when the clinic is closed that cannot wait until the next business day, you can page your doctor at the number below.    Please note that while we do our best to be available for urgent issues outside of office hours, we are not available 24/7.   If you have an urgent issue and are unable to reach [place], you may choose to seek medical care at your doctor's office, retail clinic, urgent care center, or emergency room.  If you have a medical emergency, please immediately call 911 or go to the emergency department.  Pager Numbers  - Dr. Nehemiah Massed: [phone]  - Dr. Laurence Ferrari: [phone]  - Dr. Nicole Kindred: [phone]  In the event of inclement weather, please call our main line at [phone] for an update on the status of any delays or closures.  Dermatology Medication Tips: Please keep the boxes that topical medications come in in order to help keep track of the instructions about where and how to use these. Pharmacies typically print the medication instructions only on the boxes and not directly on the medication tubes.   If your medication is too expensive, please contact our office at [phone] option 4 or send [place] a message through [place].   We are unable to tell what your co-pay for medications will be in advance as this is different depending on your insurance coverage. However, we may be able to find a substitute  medication at lower cost or fill out paperwork to get insurance to cover a needed medication.   If a prior authorization is required to get your medication covered by your insurance company, please allow [place] 1-2 business days to complete this process.  Drug prices often vary depending on where the prescription is filled and some pharmacies may offer cheaper prices.  The website www.goodrx.com contains coupons for medications through different pharmacies. The prices here do not account for what the cost may be with help from insurance (it may be cheaper with your insurance), but the website can give you the price if you did not use any insurance.  - You can print the associated coupon and take it with your prescription to the pharmacy.  - You may also stop by our office during regular business hours and pick up a GoodRx coupon card.  - If you need your prescription sent electronically to a different pharmacy, notify our office through [facility] or by phone at [phone] option 4.  Recommend Niacinamide or Nicotinamide 500mg  twice per day to lower risk of non-melanoma skin cancer by approximately 25%. This is usually available at [facility].

## 2021-04-24 NOTE — Progress Notes (Signed)
New Patient Visit  Subjective  Leroy [name] is a 87 y.o. male who presents for the following: Annual Exam (Patient has  history of skin cancer and was being followed by Dr. Evorn Gong, but his insurance would no longer cover seeing him.). Patient was seeing Dr. Evorn Gong for full skin exam every 6 months. He is not sure which type of skin cancer he had previously.  The following portions of the chart were reviewed this encounter and updated as appropriate:   Tobacco  Allergies  Meds  Problems  Med Hx  Surg Hx  Fam Hx      Review of Systems:  No other skin or systemic complaints except as noted in HPI or Assessment and Plan.  Objective  Well appearing patient in no apparent distress; mood and affect are within normal limits.  A full examination was performed including scalp, head, eyes, ears, nose, lips, neck, chest, axillae, abdomen, back, buttocks, bilateral upper extremities, bilateral lower extremities, hands, feet, fingers, toes, fingernails, and toenails. All findings within normal limits unless otherwise noted below.   Assessment & Plan  AK (actinic keratosis) (10) L lat brow x 1, L temple x 1, L ear helix x 4, L zygoma x 1, R anti helix x 1, R helix x 1, L mid back x 1  Prior to procedure, discussed risks of blister formation, small wound, skin dyspigmentation, or rare scar following cryotherapy. Recommend Vaseline ointment to treated areas while healing.   Destruction of lesion - L lat brow x 1, L temple x 1, L ear helix x 4, L zygoma x 1, R anti helix x 1, R helix x 1, L mid back x 1  Destruction method: cryotherapy   Informed consent: discussed and consent obtained   Lesion destroyed using liquid nitrogen: Yes   Cryotherapy cycles:  2 Outcome: patient tolerated procedure well with no complications   Post-procedure details: wound care instructions given    Lentigines - Scattered tan macules - Due to sun exposure - Benign-appering, observe - Recommend daily broad  spectrum sunscreen SPF 30+ to sun-exposed areas, reapply every 2 hours as needed. - Call for any changes  Seborrheic Keratoses - Stuck-on, waxy, tan-brown papules and/or plaques  - Benign-appearing - Discussed benign etiology and prognosis. - Observe - Call for any changes  Melanocytic Nevi - Tan-brown and/or pink-flesh-colored symmetric macules and papules - Benign appearing on exam today - Observation - Call clinic for new or changing moles - Recommend daily use of broad spectrum spf 30+ sunscreen to sun-exposed areas.   Hemangiomas - Red papules - Discussed benign nature - Observe - Call for any changes  Actinic Damage - Severe, confluent actinic changes with pre-cancerous actinic keratoses  - Severe, chronic, not at goal, secondary to cumulative UV radiation exposure over time - diffuse scaly erythematous macules and papules with underlying dyspigmentation - Discussed Prescription "Field Treatment" for Severe, Chronic Confluent Actinic Changes with Pre-Cancerous Actinic Keratoses Field treatment involves treatment of an entire area of skin that has confluent Actinic Changes (Sun/ Ultraviolet light damage) and PreCancerous Actinic Keratoses by method of PhotoDynamic Therapy (PDT) and/or prescription Topical Chemotherapy agents such as 5-fluorouracil, 5-fluorouracil/calcipotriene, and/or imiquimod.  The purpose is to decrease the number of clinically evident and subclinical PreCancerous lesions to prevent progression to development of skin cancer by chemically destroying early precancer changes that may or may not be visible.  It has been shown to reduce the risk of developing skin cancer in the treated area. As  a result of treatment, redness, scaling, crusting, and open sores may occur during treatment course. One or more than one of these methods may be used and may have to be used several times to control, suppress and eliminate the PreCancerous changes. Discussed treatment course,  expected reaction, and possible side effects. - Recommend daily broad spectrum sunscreen SPF 30+ to sun-exposed areas, reapply every 2 hours as needed.  - Staying in the shade or wearing long sleeves, sun glasses (UVA+UVB protection) and wide brim hats (4-inch brim around the entire circumference of the hat) are also recommended. - Call for new or changing lesions. - Patient deferred treatment at this time.  Purpura - Chronic; persistent and recurrent.  Treatable, but not curable. - Violaceous macules and patches - Benign - Related to trauma, age, sun damage and/or use of blood thinners, chronic use of topical and/or oral steroids - Observe - Can use OTC arnica containing moisturizer such as Dermend Bruise Formula if desired - Call for worsening or other concerns  History of skin cancer - unsure which type, will request records from Dr. Evorn Gong -  Patient c/o sensitivity at excision site. Recommend OTC Sarna and Lidocaine as directed on package. - No evidence of recurrence today - No lymphadenopathy - Recommend regular full body skin exams - Recommend daily broad spectrum sunscreen SPF 30+ to sun-exposed areas, reapply every 2 hours as needed.  - Call if any new or changing lesions are noted between office visits  Nevus Spilus - L lower back  - Brown macules or papules within lighter tan patch - Genetic - Benign, observe - Call for any changes  Skin cancer screening performed today.  Return in about 6 months (around 10/24/2021) for TBSE.  Luther Redo, CMA, am acting as scribe for Forest Gleason, MD .  Documentation: I have reviewed the above documentation for accuracy and completeness, and I agree with the above.  Forest Gleason, MD

## 2021-06-03 ENCOUNTER — Other Ambulatory Visit: Payer: Medicare Other | Admitting: Nurse Practitioner

## 2021-06-03 ENCOUNTER — Other Ambulatory Visit: Payer: Self-pay

## 2021-06-03 ENCOUNTER — Encounter: Payer: Self-pay | Admitting: Nurse Practitioner

## 2021-06-03 DIAGNOSIS — R5381 Other malaise: Secondary | ICD-10-CM

## 2021-06-03 DIAGNOSIS — F0391 Unspecified dementia with behavioral disturbance: Secondary | ICD-10-CM

## 2021-06-03 DIAGNOSIS — Z515 Encounter for palliative care: Secondary | ICD-10-CM

## 2021-06-03 NOTE — Progress Notes (Signed)
Leroy [name] Consult Note Telephone: [phone]  Fax: [phone]    Date of encounter: 06/03/21 PATIENT NAME: Leroy [name] [address] [place] [address]   [phone] (home)  DOB: 11-04-1929 MRN: [id number] PRIMARY CARE PROVIDER:    Idelle Crouch, MD,  [address] [place] [address] [phone]  RESPONSIBLE PARTY:    Contact Information     Name Relation Home Work Mobile   [place] Spouse [phone]     Leola Brazil Daughter [phone]  [phone]   Sena Hitch Daughter   [phone]      I met face to face with patient and family in home. Palliative Care was asked to follow this patient by consultation request of  Leroy [name], Leroy Douglas, MD to address advance care planning and complex medical decision making. This is a follow up visit.                              ASSESSMENT AND PLAN / RECOMMENDATIONS:  Symptom Management/Plan: 1. Advance Care Planning; DNR in [place]; will revisit at next [facility] visit MOST form   2. Goals of Care: Goals include to maximize quality of life and symptom management. Our advance care planning conversation included a discussion about:    The value and importance of advance care planning  Exploration of personal, cultural or spiritual beliefs that might influence medical decisions  Exploration of goals of care in the event of a sudden injury or illness  Identification and preparation of a healthcare agent  Review and updating or creation of an advance directive document.   3. Palliative care encounter; Palliative care encounter; Palliative medicine team will continue to support patient, patient's family, and medical team. Visit consisted of counseling and education dealing with the complex and emotionally intense issues of symptom management and palliative care in the setting of serious and potentially life-threatening illness    4. Debility secondary to dementia,  Continue to encourage mobility, monitor fall risk.   5. Memory loss secondary to dementia, progressive, worsen since hospitalization. Continue supportive role, monitoring for decline  Follow up Palliative Care Visit: Palliative care will continue to follow for complex medical decision making, advance care planning, and clarification of goals. Return 8 weeks or prn.  I spent 65 minutes providing this consultation. More than 50% of the time in this consultation was spent in counseling and care coordination  PPS: 50%  Chief Complaint: Follow up Palliative consult for complex medical decision making  HISTORY OF PRESENT ILLNESS:  Leroy [name] is a 87 y.o. year old male  with multiple medical problems including  Dementia,  intraventricular conduction defect, Stroke, peripheral vascular disease, obesity, hypertension, hyperlipidemia, chronic kidney disease, anemia, skin cancer, Tremor, vitamin D deficiency, vitamin B12 deficiency, hiatal hernia, history of gastric ulcer, lumbar laminectomy with decompression microdiscectomy, eye surgery. Seen in the emergency department 3/5 / 2022 to 3/6 / 2022 with generalized weakness and dizziness with MRI - for stroke show chronic microhemorrhages. Hospitalized 02/25/2021 to 03/06/2021 for acute pancreatitis with cholelithiasis, choledocholithiasis with MRCP Performed with GI consulted due to abrupt truncation of distal CBD It was felt to be from pancreatic edema treated with bowel rest, supportive care, zosyn with general surgery consulted underwent a laparoscopic cholecystectomy with interoperative cholangiogram on 03/04/2021 with findings of filling defect in the common bile duct. GI He consulted for ERCP underwent with stone extraction and sphincterotomy on  03/05/2021. LFTS improved with biopsies taken from gastric mucosa for erythema. Since last PC visit, Leroy [name] has had Covid infection which has since resolved. I called Leroy [name], Mr.  [name] daughter to confirm palliative care visiting COVID screening which was negative. I visited [place] Leroy [name] wife, daughter Leroy [name] and Leroy [name] in their home. Leroy [name] was sitting in the recliner in his living room, appears comfortable. We talked about how he is feeling today. Leroy [name] endorses he is doing better period we talked about recent COVID infection, symptoms and he seems to be fully recovered. Leroy [name] his wife and or says he never lost his appetite. Leroy [name] endorses he did have some periods of time where he was having worsening shortness of breath but that only lasted for a few days. Leroy [name] endorses he seems to have completely recovered. No recent hospitalizations, wounds, falls. Leroy [name] did restart his Plavix back and does have bruising on his hands which Leroy [name] endorses does happen with the Plavix. We talked about daily routine. Leroy [name] endorses Leroy [name] has been able to take his own shower though she does help him. Leroy [name] endorses she has been leaving posted notes for him when she goes out for things for him to do. Leroy [name] endorses she left notes the other day where he got up and got dressed, ate his breakfast and he took his pill. We talked about independence as able. No new cognitive changes. Upcoming appointment with Leroy [name] Neurology. We talked about daily routine in quality-of-life. We talked about Leroy [name] agreed to no longer drive a car. Leroy [name] continues to drive his tractor and lawnmower. Leroy [name] endorses Leroy [name] did go across the street taking the trash. We talked about the concern for safety crossing a street in addition to with the weather being so hot. Leroy [name] in agreement to refrain from doing that. Leroy [name] talked about the care Leroy [name] provides to their dog. Leroy [name] endorses Leroy [name] feeds the dog three times a day and snacks. 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 Leroy [name] endorses he would bring the vegetable orders to the employees coming and going at shift change. Mr. Domangue talked about other life stories. Medical goals reviewed. At present Mr. Zayas does appear stable no noted weight loss. Leroy [name] talked about her mom being sick currently in short term rehab overall declining from [place]. We talked about caregiver stress and fatigue. Leroy [name] endorses she is doing fine as her priority is Mr. Laubacher as well as participating in the care of her mother. Leroy [name] did ask questions about should functional cognitive decline get to the point where she cannot care for Mr. Bottger at home. We talked about options of long-term care. Leroy [name] endorses she has a plan already in place for when that does occur but would like to talk about that later. We talked about follow up palliative care visiting three months if needed or sooner should he decline. Mr. Thieme in agreement, appointment scheduled with Leroy [name] his daughter.   History obtained from review of EMR, discussion with Leroy [name] daughter, wife Leroy [name] and Mr. Fraley.  I reviewed available labs, medications, imaging, studies and related documents from the EMR.  Records reviewed and summarized above.   ROS Full 14 system review of systems performed and negative with exception of: as per HPI.   Physical Exam: Constitutional:  NAD General: frail appearing, pleasant male EYES: lids intact ENMT: oral mucous membranes moist CV: S1S2, RRR Pulmonary: LCTA, no increased work of breathing, no cough, room air Abdomen: soft and non tender MSK: ambulatory Skin: warm and dry Neuro:  +generalized weakness,  + cognitive impairment Psych: non-anxious affect, A and O x 3  Questions and concerns were addressed. The patient/family was encouraged to call with questions and/or concerns. My contact information was provided. Provided general support and encouragement, no  other unmet needs identified   Thank you for the opportunity to participate in the care of Mr. Debarr.  The palliative care team will continue to follow. Please call our office at [phone] if we can be of additional assistance.   This chart was dictated using voice recognition software.  Despite best efforts to proofread,  errors can occur which can change the documentation meaning.   Lional Icenogle Z Lachele Lievanos, NP   COVID-19 PATIENT SCREENING TOOL Asked and negative response unless otherwise noted:   Have you had symptoms of covid, tested positive or been in contact with someone with symptoms/positive test in the past 5-10 days? NO

## 2021-06-08 IMAGING — CT CT HEAD W/O CM
3 series · 15 of 47 positions shown, 18 images · non-contrast
Comparison: Brain MRI 09/01/2017, head CT 01/06/2016

CLINICAL DATA: Blurry vision, history of hemorrhage. Additional
history provided: Blurred vision and dizziness onset this morning.

EXAM:
CT HEAD WITHOUT CONTRAST
TECHNIQUE: Contiguous axial images were obtained from the base of the skull
through the vertex without intravenous contrast.

[Series 3: head 5.0 h30s · axial · 0.50mm/px · z∈[+1276,+1411]mm · 9 of 33 slices shown, 12 images]
[im 3/33  brain]
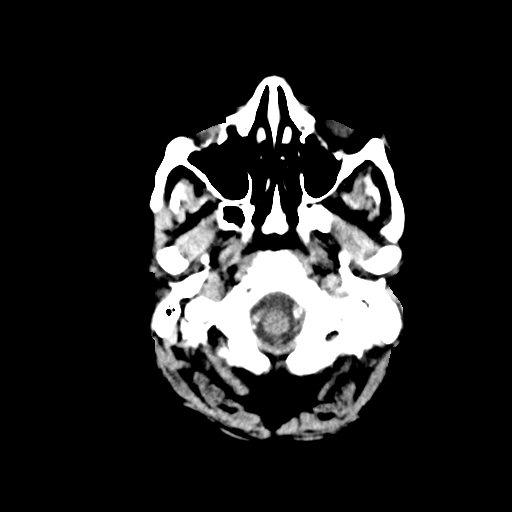
[im 3/33  bone]
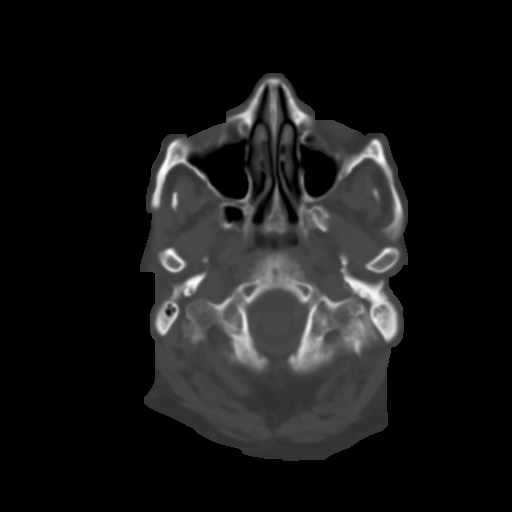
[im 6/33  brain]
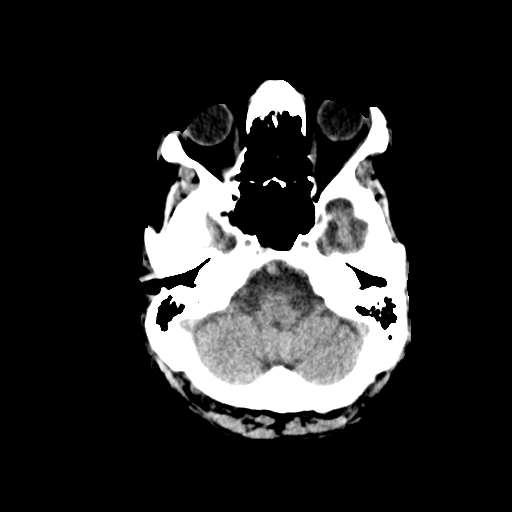
[im 9/33  brain]
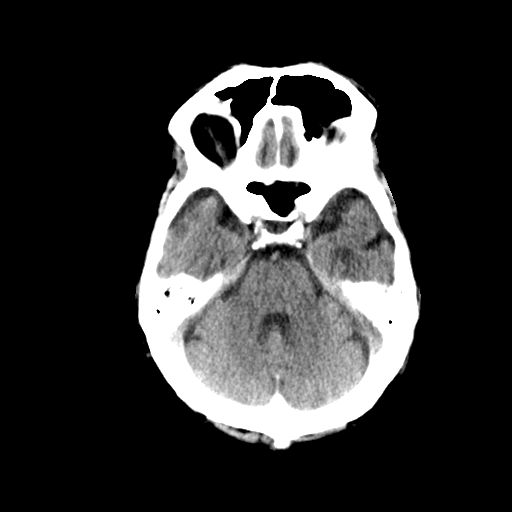
[im 13/33  brain]
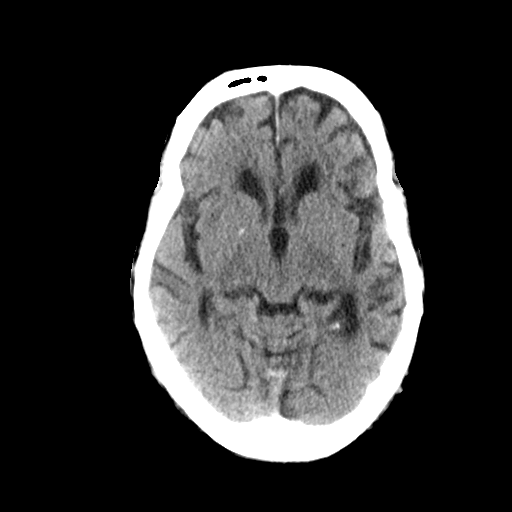
[im 17/33  brain]
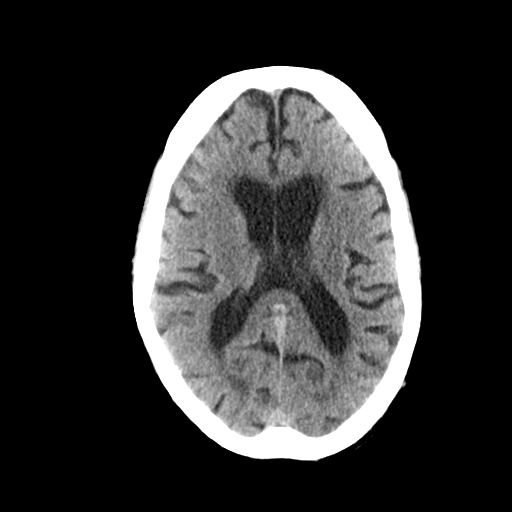
[im 17/33  bone]
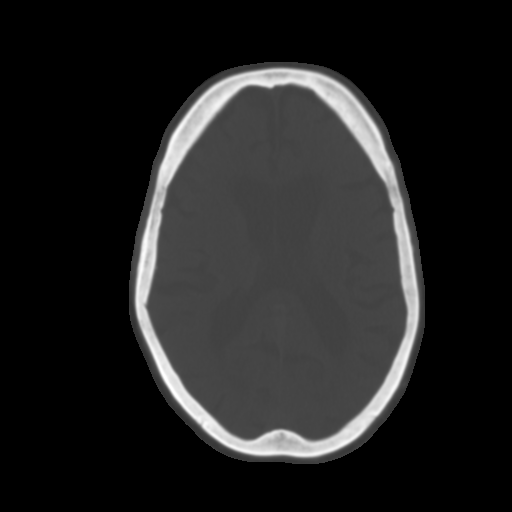
[im 20/33  brain]
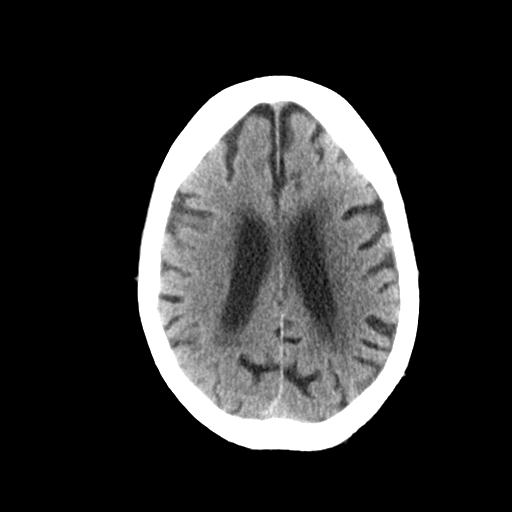
[im 24/33  brain]
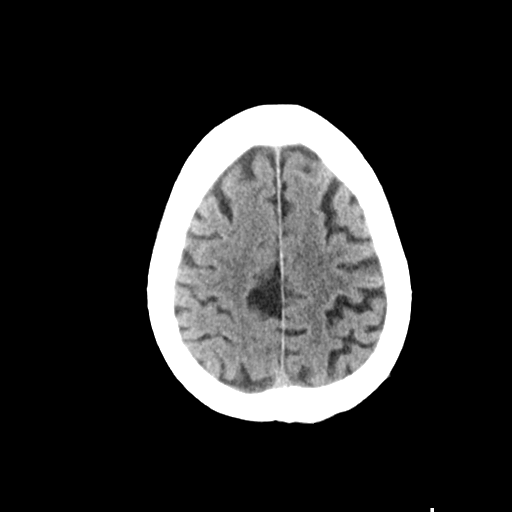
[im 27/33  brain]
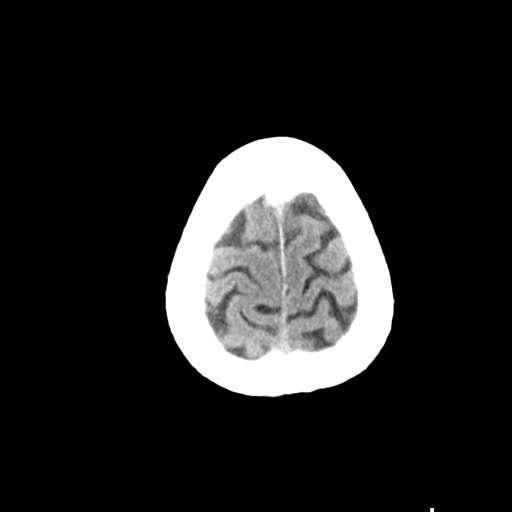
[im 30/33  brain]
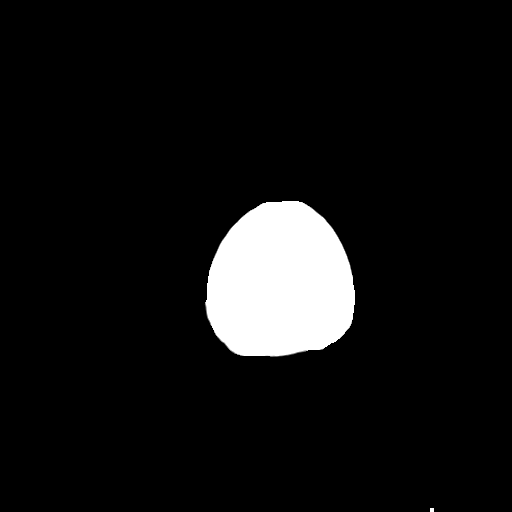
[im 30/33  bone]
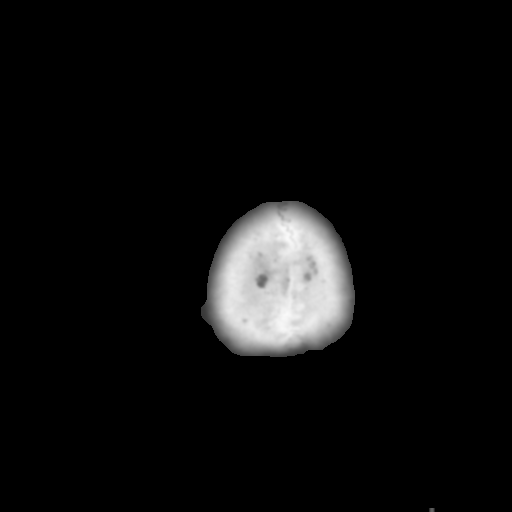

[Series 5: head 3.0 mpr cor · coronal · 0.34mm/px · 3 of 76 slices shown]
[im 26/76  brain]
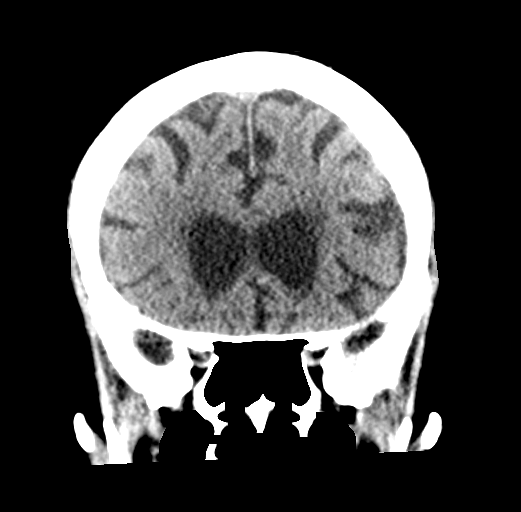
[im 34/76  brain]
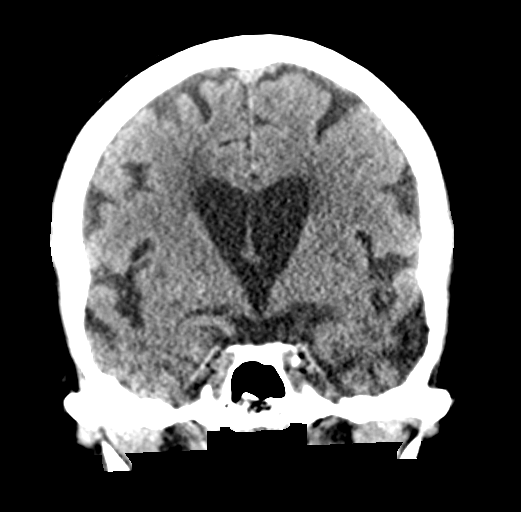
[im 42/76  brain]
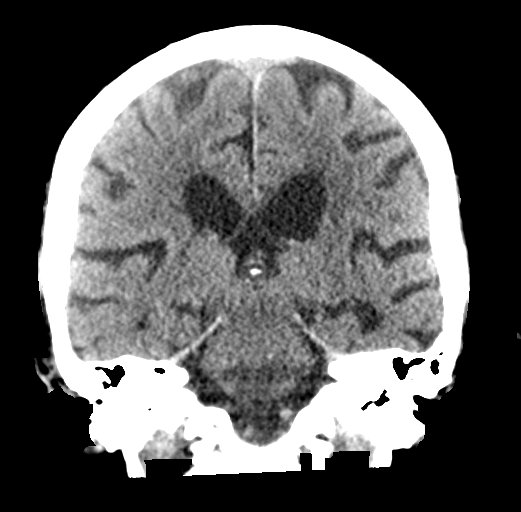

[Series 6: head 3.0 mpr sag · sagittal · 0.34mm/px · 3 of 60 slices shown]
[im 20/60  brain]
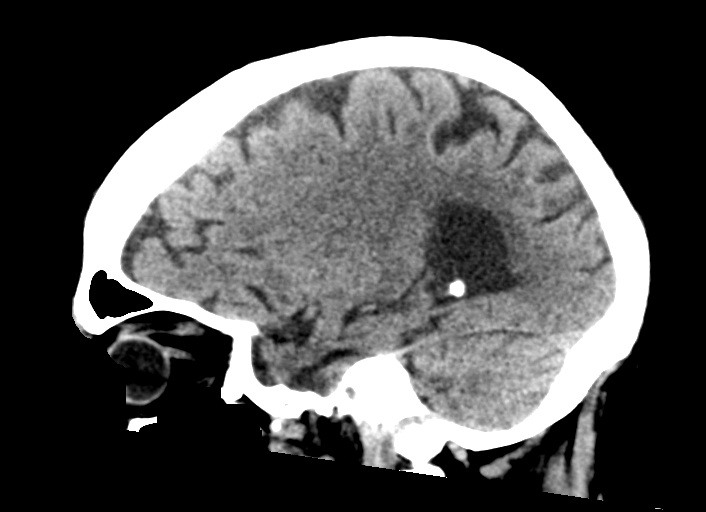
[im 30/60  brain]
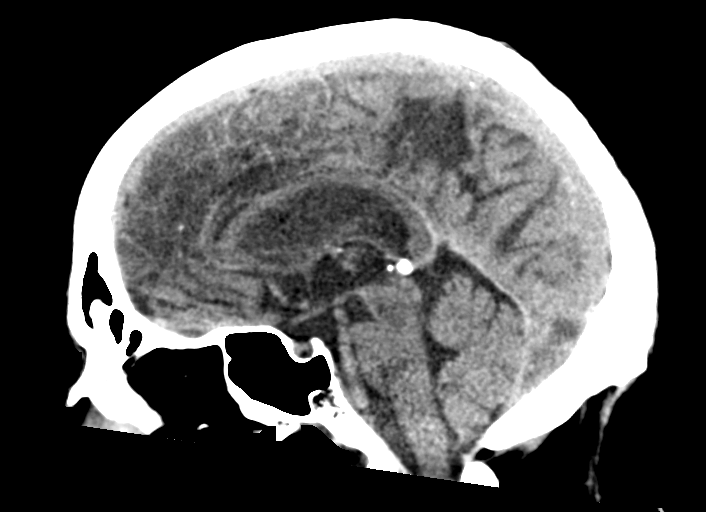
[im 40/60  brain]
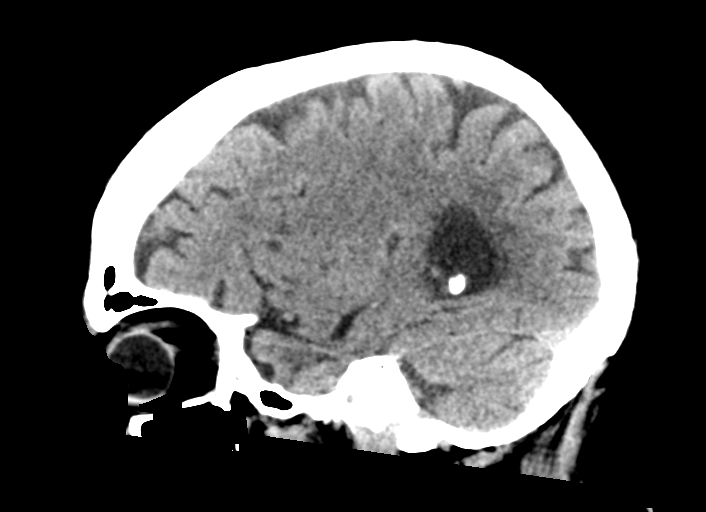

[15 of 47 positions shown; findings below may reference images not displayed]

FINDINGS: Brain:

Ill-defined hypoattenuation within the cerebral white matter is
nonspecific, but consistent with chronic small vessel ischemic
disease. Known small chronic lacunar infarct within the left
thalamus/internal capsule better appreciated on prior MRI
09/01/2017.

Stable, moderate generalized parenchymal atrophy.

There is no acute intracranial hemorrhage.

No demarcated cortical infarct.

No extra-axial fluid collection.

No evidence of intracranial mass.

No midline shift.

Vascular: No hyperdense vessel.  Atherosclerotic calcifications

Skull: Normal. Negative for fracture or focal lesion.

Sinuses/Orbits: Visualized orbits show no acute finding. Mild
ethmoid sinus mucosal thickening. No significant mastoid effusion.
IMPRESSION: 1. No evidence of acute intracranial abnormality.
2. Moderate generalized parenchymal atrophy with chronic small
vessel ischemic disease. Known remote left thalamic/internal capsule
lacunar infarct.
3. Mild ethmoid sinus mucosal thickening.

## 2021-06-27 ENCOUNTER — Ambulatory Visit (INDEPENDENT_AMBULATORY_CARE_PROVIDER_SITE_OTHER): Payer: Medicare HMO | Admitting: Vascular Surgery

## 2021-06-27 ENCOUNTER — Encounter (INDEPENDENT_AMBULATORY_CARE_PROVIDER_SITE_OTHER): Payer: Medicare HMO

## 2021-08-17 ENCOUNTER — Encounter: Payer: Self-pay | Admitting: Nurse Practitioner

## 2021-08-20 ENCOUNTER — Other Ambulatory Visit: Payer: Medicare Other | Admitting: Nurse Practitioner

## 2021-09-24 ENCOUNTER — Telehealth: Payer: Self-pay

## 2021-09-24 NOTE — Telephone Encounter (Signed)
Confirmed with daughter [name] that it is ok for RN Owens Shark to visit instead of NP for palliative follow up. Notified team.  Also, asked SW to follow up with daughter for caregiving questions

## 2021-09-26 DIAGNOSIS — Z515 Encounter for palliative care: Secondary | ICD-10-CM

## 2021-09-26 NOTE — Progress Notes (Signed)
TELEPHONE ENCOUNTER::  09/26/21 @10AM : PC SW outreached patients daughter, [name], per [facility] RN request, to assess patients needs and provide resources.   Daughter shared that patient suffers from progressing alzheimer's and it is becoming more difficult to assist patient as he does not feel he needs assistance. Patients spouse recently had a mental breakdown and received IP treatment since her release she has been residing with her children, patient does not fully comprehend the details of this due to his cognition. Patients brother has been residing with him while his home is being remodeled but will be moving back into his home in 2 weeks. These familial dynamics adding to overall situational stressors, as patient is unable to reside alone per daughter.   Daughter shares that patient is independent with all ADL's. Patient no longer drives. Patient is forgetful of taking medications and does not attend to basic hygiene of showering everyday. 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 Daughter and patients brother assist with meal prep.   SW and daughter discussed short and long term caregiver needs for patient. Daughter shared that she is currently putting out fillers for private caregivers in the area. Patient has a long term care policy, which he did a buyout on last year due to the premiums being too high, currently this policy only provides assistance with SNF placement. Daughter and SW also discussed VA benefits as patient was in [employer], daughter will look more into this.  SW advised daughter that there are supplemental caregiver agencies available that is not covered by insurance that patients daughter can consider for private pay should they feel patient needs additional assistance. SW also advised daughter to look into automatic time release pill boxes with alarms for patient. Daughter stated understanding.     SW utilized active listening, verbal support and reciprocal empathic responses with daughter as this is new for her as well and assuming the primary caregiver role for patient along with her sister. SW provided contact info should daughter require further assistance. SW will join [facility] RN on next in home visit on 12/8 to continue addressing needs and provide support.

## 2021-09-29 ENCOUNTER — Ambulatory Visit: Payer: Medicare Other | Admitting: Dermatology

## 2021-09-29 ENCOUNTER — Other Ambulatory Visit: Payer: Self-pay | Admitting: Dermatology

## 2021-09-29 ENCOUNTER — Other Ambulatory Visit: Payer: Self-pay

## 2021-09-29 DIAGNOSIS — L0291 Cutaneous abscess, unspecified: Secondary | ICD-10-CM | POA: Diagnosis not present

## 2021-09-29 DIAGNOSIS — L720 Epidermal cyst: Secondary | ICD-10-CM

## 2021-09-29 MED ORDER — DOXYCYCLINE MONOHYDRATE 100 MG PO CAPS: 100.0000 mg | ORAL_CAPSULE | Freq: Two times a day (BID) | ORAL | 0 refills | Status: DC

## 2021-09-29 MED ORDER — MUPIROCIN 2 % EX OINT: 1.0000 "application " | TOPICAL_OINTMENT | CUTANEOUS | 0 refills | Status: DC

## 2021-09-29 NOTE — Patient Instructions (Addendum)
If You Need Anything After Your Visit  If you have any questions or concerns for your doctor, please call our main line at [phone] and press option 4 to reach your doctor's medical assistant. If no one answers, please leave a voicemail as directed and we will return your call as soon as possible. Messages left after 4 pm will be answered the following business day.   You may also send us a message via MyChart. We typically respond to MyChart messages within 1-2 business days.  For prescription refills, please ask your pharmacy to contact our office. Our fax number is [phone].  If you have an urgent issue when the clinic is closed that cannot wait until the next business day, you can page your doctor at the number below.    Please note that while we do our best to be available for urgent issues outside of office hours, we are not available 24/7.   If you have an urgent issue and are unable to reach us, you may choose to seek medical care at your doctor's office, retail clinic, urgent care center, or emergency room.  If you have a medical emergency, please immediately call 911 or go to the emergency department.  Pager Numbers  - Dr. Kowalski: [phone]  - Dr. Moye: [phone]  - Dr. Stewart: [phone]  In the event of inclement weather, please call our main line at [phone] for an update on the status of any delays or closures.  Dermatology Medication Tips: Please keep the boxes that topical medications come in in order to help keep track of the instructions about where and how to use these. Pharmacies typically print the medication instructions only on the boxes and not directly on the medication tubes.   If your medication is too expensive, please contact our office at [phone] option 4 or send us a message through MyChart.   We are unable to tell what your co-pay for medications will be in advance as this is different depending on your insurance coverage.  However, we may be able to find a substitute medication at lower cost or fill out paperwork to get insurance to cover a needed medication.   If a prior authorization is required to get your medication covered by your insurance company, please allow us 1-2 business days to complete this process.  Drug prices often vary depending on where the prescription is filled and some pharmacies may offer cheaper prices.  The website www.goodrx.com contains coupons for medications through different pharmacies. The prices here do not account for what the cost may be with help from insurance (it may be cheaper with your insurance), but the website can give you the price if you did not use any insurance.  - You can print the associated coupon and take it with your prescription to the pharmacy.  - You may also stop by our office during regular business hours and pick up a GoodRx coupon card.  - If you need your prescription sent electronically to a different pharmacy, notify our office through [place] MyChart or by phone at [phone] option 4.     Si Usted Necesita Algo Despus de Su Visita  Tambin puede enviarnos un mensaje a travs de MyChart. Por lo general respondemos a los mensajes de MyChart en el transcurso de 1 a 2 das hbiles.  Para renovar recetas, por favor pida a su farmacia que se ponga en contacto con nuestra oficina. Nuestro nmero de fax es el [phone].  Si tiene   un asunto urgente cuando la clnica est cerrada y que no puede esperar hasta el siguiente da hbil, puede llamar/localizar a su doctor(a) al nmero que aparece a continuacin.   Por favor, tenga en cuenta que aunque hacemos todo lo posible para estar disponibles para asuntos urgentes fuera del horario de [place], no estamos disponibles las 24 horas del da, los 7 das de la [place].   Si tiene un problema urgente y no puede comunicarse con nosotros, puede optar por buscar atencin mdica  en el consultorio de su  doctor(a), en una clnica privada, en un centro de atencin urgente o en una sala de emergencias.  Si tiene Engineering geologist, por favor llame inmediatamente al 911 o vaya a la sala de emergencias.  Nmeros de bper  - Dr. Nehemiah Massed: [phone]  - Dra. Moye: [phone]  - Dra. Nicole Kindred: [phone]  En caso de inclemencias del [place], por favor llame a Johnsie Kindred principal al [phone] para una actualizacin sobre el [place] de cualquier retraso o cierre.  Consejos para la medicacin en dermatologa: Por favor, guarde las cajas en las que vienen los medicamentos de uso tpico para ayudarle a seguir las instrucciones sobre dnde y cmo usarlos. Las farmacias generalmente imprimen las instrucciones del medicamento slo en las cajas y no directamente en los tubos del [place].   Si su medicamento es muy caro, por favor, pngase en contacto con Zigmund Daniel llamando al [phone] y presione la opcin 4 o envenos un mensaje a travs de Pharmacist, community.   No podemos decirle cul ser su copago por los medicamentos por adelantado ya que esto es diferente dependiendo de la cobertura de su seguro. Sin embargo, es posible que podamos encontrar un medicamento sustituto a Electrical engineer un formulario para que el seguro cubra el medicamento que se considera necesario.   Si se requiere una autorizacin previa para que su compaa de seguros [place] su medicamento, por favor permtanos de 1 a 2 das hbiles para completar este proceso.  Los precios de los medicamentos varan con frecuencia dependiendo del Environmental consultant de dnde se surte la receta y alguna farmacias pueden ofrecer precios ms baratos.  El sitio web www.goodrx.com tiene cupones para medicamentos de Airline pilot. Los precios aqu no tienen en cuenta lo que podra costar con la ayuda del seguro (puede ser ms barato con su seguro), pero el sitio web puede darle el precio si no utiliz Research scientist (physical sciences).  - Puede imprimir el cupn  correspondiente y llevarlo con su receta a la farmacia.  - Tambin puede pasar por nuestra oficina durante el horario de atencin regular y Charity fundraiser una tarjeta de cupones de GoodRx.  - Si necesita que su receta se enve electrnicamente a una farmacia diferente, informe a nuestra oficina a travs de MyChart de [place] o por telfono llamando al [phone] y presione la opcin 4.   For Doxycycline Take 1 pill 2 times a day for 2 weeks, then decrease to 1 pill a day for remainder of prescription.  Take with food and drink.

## 2021-09-29 NOTE — Progress Notes (Signed)
   Follow-Up Visit   Subjective  Leroy [name] is a 87 y.o. male who presents for the following: growth (R cheek, ~4wks, has drained a little bit).  Patient accompanied by daughter who contributes to history.  The following portions of the chart were reviewed this encounter and updated as appropriate:       Review of Systems:  No other skin or systemic complaints except as noted in HPI or Assessment and Plan.  Objective  Well appearing patient in no apparent distress; mood and affect are within normal limits.  A focused examination was performed including face. Relevant physical exam findings are noted in the Assessment and Plan.  R lower cheek 1.2cm pink sub q nodule with punctum and drainage      Assessment & Plan  Epidermal cyst R lower cheek  Inflamed  Start Doxycycline 100mg  1 po bid for 2 weeks, then 1 po qd until finish, #60 0rf, take with food and drink  Start Mupirocin oint qd/bid  Doxycycline should be taken with food to prevent nausea. Do not lay down for 30 minutes after taking. Be cautious with sun exposure and use good sun protection while on this medication. Pregnant women should not take this medication.    Incision and Drainage - R lower cheek Location: R lower cheek  Informed Consent: Discussed risks (permanent scarring, light or dark discoloration, infection, pain, bleeding, bruising, redness, damage to adjacent structures, and recurrence of the lesion) and benefits of the procedure, as well as the alternatives.  Informed consent was obtained.  Preparation: The area was prepped with alcohol.  Anesthesia: Lidocaine 1% with epinephrine  Procedure Details: An incision was made overlying the lesion. The lesion drained white, chalky cyst material and blood.  A small amount of fluid was drained.   Curettage and irrigation performed today.  Antibiotic ointment and a sterile pressure dressing were applied. The patient tolerated procedure well.  Total  number of lesions drained: 1  Plan: The patient was instructed on post-op care. Recommend OTC analgesia as needed for pain.   doxycycline (MONODOX) 100 MG capsule - R lower cheek Take 1 capsule (100 mg total) by mouth 2 (two) times daily. Take with food and drink  mupirocin ointment (BACTROBAN) 2 % - R lower cheek Apply 1 application topically as directed. Qd to bid to aa face  Return for as scheduled with Dr. Laurence Ferrari.  I, Leroy [name], RMA, am acting as scribe for Leroy Patty, MD .  Documentation: I have reviewed the above documentation for accuracy and completeness, and I agree with the above.  Leroy Patty MD

## 2021-10-09 ENCOUNTER — Other Ambulatory Visit: Payer: Medicare Other | Admitting: Nurse Practitioner

## 2021-10-09 ENCOUNTER — Other Ambulatory Visit: Payer: Self-pay

## 2021-10-09 ENCOUNTER — Other Ambulatory Visit: Payer: Medicare Other

## 2021-10-09 VITALS — BP 132/78 | HR 71 | Temp 97.5°F | Resp 18

## 2021-10-09 DIAGNOSIS — Z515 Encounter for palliative care: Secondary | ICD-10-CM

## 2021-10-09 NOTE — Progress Notes (Signed)
COMMUNITY PALLIATIVE CARE SW NOTE  PATIENT NAME: Leroy [name] DOB: 06/11/1930 MRN: [id number]  PRIMARY CARE PROVIDER: Idelle Crouch, MD  RESPONSIBLE PARTY:  Acct ID - Guarantor Home Phone Work Phone Relationship Acct Type  [id number] [name], [name]  Self P/F     [address], [place], [place] [address]     PLAN OF CARE and INTERVENTIONS:             GOALS OF CARE/ ADVANCE CARE PLANNING:  Goals include to maximize quality of life and symptom management. Our advance care planning conversation included a discussion about:    The value and importance of advance care planning  Review and updating or creation of an advance directive document.  Patient is a DNR. Daughters share HCPOA. Patient's goal is to avoid hospitalizations and remain in the home with daughter and SIL.    2.         SOCIAL/EMOTIONAL/SPIRITUAL ASSESSMENT/ INTERVENTIONS:  SW and RN met with patient and daughter, [name], patient's brother, Vicente Serene, was present as well, in patients home for monthly palliative care visit. Patient resides in a one story home. Patients is married, but wife does not live in the home. Patients brother currently resides with him for a short time. Patient found sitting in recliner in sitting room during visit.  Functional changes/updates: Patient is 87 y.o. year old male with hx of CVA and other medical complications. Patient share that he does not any pains, but does experience weakness in legs often. Patient is ambulatory w/o AD, but has RW and SPC if needed. Patient does not drive. No medical changes since last in home PC visit.   Home & Environment assessment: Patient has 3-4 steps to back door entry with rails. Patients sitting/living room is open and non-cluttered. Throw rugs are visible under some chairs. Kitchen is open and useful for patient to maneuver. Bedroom and bathroom not assessed.   Protein calorie malnutrition/weight loss/Appetite: Patient and family share  that his appetite is good. No weight loss concerns. Fluid intake is fair, patient reports drinking nearly a quart of water a day. No concerns or issues with sleeping noted.  Psychosocial assessment: completed. No physical needs identified at this time. SW discussed resources available if needed. PC and daughter discussed the option of having an automated pill box dispenser. SW emailed daughter links to recommendable pill boxes. Life alert systems discussed as well. SW emailed daughter information on lift alert systems. Ongoing support/resources will continued to be offered if needed.  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 Patient shared that he believes he joined 1951/1952 and served 2 yrs. Patient shared that his father and grandfather both passed while he was in service. 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 SW discussed with daughter possible VA benefits, depending on patients actual time served. Daughter will get with her sister to locate patients [place] documents.    Medical assessment: RN reviewed medications and took vitals. RN provided education around potential life threatening illness and symptom management. No medications identified.  Hospice discussion: N/A  SW discussed goals, reviewed care plan, provided emotional support, used active and reflective listening in the form of reciprocity emotional response. Patient shared that her wishes and goals are to remain in his home for as long as safely possible. Daughter supports this and states that after patients brother  moves back into his home, patient will be alone but she will be to check on patient daily as well as other family members and neighbors. Questions and concerns were addressed. The patient/family was encouraged to call with any additional questions and/or concerns. PC Provided general support and  encouragement, no other unmet needs identified. Will continue to follow.  3.         PATIENT/CAREGIVER EDUCATION/ COPING:   Appearance: well groomed, appropriate given situation  Mental Status: Alert/oriented x 3 (person, place, time, situation). Cognitive deficits present d/t CVA. Eye Contact: Good  Thought Process: Rational  Thought Content: Denies current or past history of suicidal/homicidal ideation  Speech: Normal rate, volume, tone  Mood: Normal Affect: Congruent to endorsed mood, full ranging Insight: Realistic assessment of situation  Judgement: Good  Interaction Style: Cooperative Patient A&O x3, able to answer questions appropriately. Patient able to engage in fluid conversation. Patient in good spirits today. No anxiety or depression present at this time. Patient does have dx of Anxiety which is being managed by an SSRI, no symptoms endorsed. PHQ 9 not completed. Patients family is supportive.   4.         PERSONAL EMERGENCY PLAN:  Patient will call 9-1-1 for emergencies.   5.         COMMUNITY RESOURCES COORDINATION/ HEALTH CARE NAVIGATION:  Patient and daughter manages his care.  6.  FINANCIAL CONCERNS/NEEDS: None.   Primary Health Insurance: [facility] Medicare Advantage Secondary Health Insurance: N/A Prescription Coverage: Yes, no history of difficulty obtaining or affording prescriptions reported     SOCIAL HX:  Social History   Tobacco Use   Smoking status: Never   Smokeless tobacco: Current    Types: Chew  Substance Use Topics   Alcohol use: No    CODE STATUS: DNR  ADVANCED DIRECTIVES: Y MOST FORM COMPLETE:  N HOSPICE EDUCATION PROVIDED: N  PPS: Patient is independent with all ADL's at this time.  Patient is A&O x3 with low-average insight and judgement.   Time spent: [place], [place]

## 2021-10-09 NOTE — Progress Notes (Signed)
PATIENT NAME: Leroy [name] DOB: [birth year] MRN: [id number]  PRIMARY CARE PROVIDER: Idelle Crouch, MD  RESPONSIBLE PARTY:  Acct ID - Guarantor Home Phone Work Phone Relationship Acct Type  [id number] [name], [name]  Self P/F     [address], [place], [place] [address]    PLAN OF CARE and INTERVENTIONS:               1.  GOALS OF CARE/ ADVANCE CARE PLANNING:  Patient desires to remain in his home for as long as possible.                2.  PATIENT/CAREGIVER EDUCATION:  Life Alert               4. PERSONAL EMERGENCY PLAN:  Activate 911 for emergencies.                5.  DISEASE STATUS:  Joint visit completed with patient, daughter-Leroy [name], brother and Leroy [name].  Debility: Patient is ambulatory without assistive devices.  He does have a cane to utilize as needed.  No falls since March of 2022.   Patient notes weakness to bilateral lower extremities.  [place] and takes breaks as needed.   Safety:  Patient's brother is currently living in the home and assisting patient.  Patient's wife is currently with her children due to her medical issues.  Patient will likely be alone in 1-2 weeks with family checking in frequently.  Discussion completed on Life Alert.  Information on different companies provided by Palliative Care SW.   Medication Management:  Patient is utilizing a pill box.  Currently taking medications without any issues.  Daughter is working to obtain another pill box that alarms.  Discussion completed on different options for pill boxes that can be found on [place].  No refills needed.  Daughter stated they will see Dr. Doy Hutching again in January.  Appetite:  Patient endorse a good appetite and is eating 3 meals daily.  Po intake consist of 1 quart of water and 1-3 Pepsi's during the day.   Weight Loss:  213 lbs most recent weight for September.  Daughter reports weight loss of about 5 lbs over the last 6 months.   Skin:   Lesion noted to right lower leg jaw.  Was recently seen and had abscess drained.  Will be seen again in January for a full body scan.  Area will likely need surgical intervention per daughter.    HISTORY OF PRESENT ILLNESS:  Leroy [name] is a 87 y.o. year old male  with multiple medical problems including  Dementia,  intraventricular conduction defect, Stroke, peripheral vascular disease, obesity, hypertension, hyperlipidemia, chronic kidney disease, anemia, skin cancer, Tremor, vitamin D deficiency, vitamin B12 deficiency, hiatal hernia, history of gastric ulcer, lumbar laminectomy with decompression microdiscectomy, eye surgery.  Patient is being followed by Palliative Care every 8-12 weeks and PRN.   CODE STATUS: DNR ADVANCED DIRECTIVES: No MOST FORM: No PPS: 50%   PHYSICAL EXAM:   VITALS: Today's Vitals   10/09/21 1105  BP: 132/78  Pulse: 71  Resp: 18  Temp: (!) 97.5 F (36.4 C)  SpO2: 96%  PainSc: 0-No pain    LUNGS: clear to auscultation  CARDIAC: Cor RRR}  EXTREMITIES: 1+ pedal edema SKIN: lesion noted to there right lower jaw that was recently drained.  Will be seen again in January for follow up and possible surgical intervention.  NEURO:  positive for weakness       Lorenza Burton, RN

## 2021-10-21 ENCOUNTER — Other Ambulatory Visit: Payer: Medicare Other

## 2021-11-04 ENCOUNTER — Encounter: Payer: Self-pay | Admitting: Dermatology

## 2021-11-04 ENCOUNTER — Other Ambulatory Visit: Payer: Self-pay

## 2021-11-04 ENCOUNTER — Ambulatory Visit: Payer: Medicare Other | Admitting: Dermatology

## 2021-11-04 DIAGNOSIS — L814 Other melanin hyperpigmentation: Secondary | ICD-10-CM

## 2021-11-04 DIAGNOSIS — Z1283 Encounter for screening for malignant neoplasm of skin: Secondary | ICD-10-CM

## 2021-11-04 DIAGNOSIS — L0291 Cutaneous abscess, unspecified: Secondary | ICD-10-CM

## 2021-11-04 DIAGNOSIS — D229 Melanocytic nevi, unspecified: Secondary | ICD-10-CM

## 2021-11-04 DIAGNOSIS — L578 Other skin changes due to chronic exposure to nonionizing radiation: Secondary | ICD-10-CM | POA: Diagnosis not present

## 2021-11-04 DIAGNOSIS — L821 Other seborrheic keratosis: Secondary | ICD-10-CM

## 2021-11-04 DIAGNOSIS — L570 Actinic keratosis: Secondary | ICD-10-CM | POA: Diagnosis not present

## 2021-11-04 DIAGNOSIS — Z872 Personal history of diseases of the skin and subcutaneous tissue: Secondary | ICD-10-CM | POA: Diagnosis not present

## 2021-11-04 DIAGNOSIS — D1801 Hemangioma of skin and subcutaneous tissue: Secondary | ICD-10-CM

## 2021-11-04 DIAGNOSIS — Z85828 Personal history of other malignant neoplasm of skin: Secondary | ICD-10-CM

## 2021-11-04 MED ORDER — SULFAMETHOXAZOLE-TRIMETHOPRIM 800-160 MG PO TABS: 1.0000 | ORAL_TABLET | Freq: Two times a day (BID) | ORAL | 1 refills | Status: AC

## 2021-11-04 NOTE — Progress Notes (Addendum)
Follow-Up Visit   Subjective  Leroy [name] is a 88 y.o. male who presents for the following: Annual Exam (HxAK's. Hx skin cancer. Unsure of type of cancer. Will request records from Dr. Roosvelt Harps office. Areas of concern today. ) and Follow-up (Has surgical appointment scheduled for cyst on right cheek. Area is still draining. Taking Doxycycline and using Mupirocin. /).  Daughter with patient.   Patient here for full body skin exam and skin cancer screening.   The following portions of the chart were reviewed this encounter and updated as appropriate:  Tobacco   Allergies   Meds   Problems   Med Hx   Surg Hx   Fam Hx       Review of Systems: No other skin or systemic complaints except as noted in HPI or Assessment and Plan.   Objective  Well appearing patient in no apparent distress; mood and affect are within normal limits.  A full examination was performed including scalp, head, eyes, ears, nose, lips, neck, chest, axillae, abdomen, back, buttocks, bilateral upper extremities, bilateral lower extremities, hands, feet, fingers, toes, fingernails, and toenails. All findings within normal limits unless otherwise noted below.  Right Buccal Cheek Erythematous inflamed nodule. Copious purulent discharge and some cheesy white material expressed today.      Nasal dorsum x1, right ear x1 (2) Erythematous thin papules/macules with gritty scale.    Assessment & Plan  Abscess Right Buccal Cheek  C/w infected epidermal inclusion cyst. Previously I&D'd by Dr. Nicole Kindred and with chalky white contents at that time.  Continue Mupirocin ointment as directed.   D/C Doxycycline.   Start Bactrim DS twice daily for 5 days. RF if improving for 10 day total dose.  Bacterial C&S performed today.   Will hold surgical removal until lesion is no longer infected. Plan surgery for 3-4 more weeks.   CrCl calculated 32.3-36.2. Pt is 6' tall.  sulfamethoxazole-trimethoprim (BACTRIM DS) 800-160  MG tablet - Right Buccal Cheek Take 1 tablet by mouth 2 (two) times daily for 5 days.  Anaerobic and Aerobic Culture - Right Buccal Cheek  Related Medications mupirocin ointment (BACTROBAN) 2 % Apply 1 application topically as directed. Qd to bid to aa face  AK (actinic keratosis) (2) Nasal dorsum x1, right ear x1  Actinic keratoses are precancerous spots that appear secondary to cumulative UV radiation exposure/sun exposure over time. They are chronic with expected duration over 1 year. A portion of actinic keratoses will progress to squamous cell carcinoma of the skin. It is not possible to reliably predict which spots will progress to skin cancer and so treatment is recommended to prevent development of skin cancer.  Recommend daily broad spectrum sunscreen SPF 30+ to sun-exposed areas, reapply every 2 hours as needed.  Recommend staying in the shade or wearing long sleeves, sun glasses (UVA+UVB protection) and wide brim hats (4-inch brim around the entire circumference of the hat). Call for new or changing lesions.  Prior to procedure, discussed risks of blister formation, small wound, skin dyspigmentation, or rare scar following cryotherapy. Recommend Vaseline ointment to treated areas while healing.   Destruction of lesion - Nasal dorsum x1, right ear x1  Destruction method: cryotherapy   Informed consent: discussed and consent obtained   Lesion destroyed using liquid nitrogen: Yes   Outcome: patient tolerated procedure well with no complications   Post-procedure details: wound care instructions given     Other Procedures Placed This Encounter Specimen status report  Lentigines - Scattered tan  macules - Due to sun exposure - Benign-appearing, observe - Recommend daily broad spectrum sunscreen SPF 30+ to sun-exposed areas, reapply every 2 hours as needed. - Call for any changes  Seborrheic Keratoses - Stuck-on, waxy, tan-brown papules and/or plaques  -  Benign-appearing - Discussed benign etiology and prognosis. - Observe - Call for any changes  Melanocytic Nevi - Tan-brown and/or pink-flesh-colored symmetric macules and papules - Benign appearing on exam today - Observation - Call clinic for new or changing moles - Recommend daily use of broad spectrum spf 30+ sunscreen to sun-exposed areas.   Hemangiomas - Red papules - Discussed benign nature - Observe - Call for any changes  Actinic Damage - Chronic condition, secondary to cumulative UV/sun exposure - diffuse scaly erythematous macules with underlying dyspigmentation - Recommend daily broad spectrum sunscreen SPF 30+ to sun-exposed areas, reapply every 2 hours as needed.  - Staying in the shade or wearing long sleeves, sun glasses (UVA+UVB protection) and wide brim hats (4-inch brim around the entire circumference of the hat) are also recommended for sun protection.  - Call for new or changing lesions. Will continue to monitor.  Skin cancer screening performed today.   Return for TBSE in 1 year. Cancel appointment for surgery next week, schedule surgery out 3-4 weeks.Loraine Maple, CMA, am acting as scribe for Forest Gleason, MD.  Documentation: I have reviewed the above documentation for accuracy and completeness, and I agree with the above.  Forest Gleason, MD

## 2021-11-04 NOTE — Patient Instructions (Addendum)
Continue Mupirocin ointment as directed.   Stop Doxycycline.   Start Bactrim DS twice daily for 5 days. Refill if improving.     Cryotherapy Aftercare  Wash gently with soap and water everyday.   Apply Vaseline and Band-Aid daily until healed.   Prior to procedure, discussed risks of blister formation, small wound, skin dyspigmentation, or rare scar following cryotherapy. Recommend Vaseline ointment to treated areas while healing.    Melanoma ABCDEs  Melanoma is the most dangerous type of skin cancer, and is the leading cause of death from skin disease.  You are more likely to develop melanoma if you: Have light-colored skin, light-colored eyes, or red or blond hair Spend a lot of time in the sun Tan regularly, either outdoors or in a tanning bed Have had blistering sunburns, especially during childhood Have a close family member who has had a melanoma Have atypical moles or large birthmarks  Early detection of melanoma is key since treatment is typically straightforward and cure rates are extremely high if we catch it early.   The first sign of melanoma is often a change in a mole or a new dark spot.  The ABCDE system is a way of remembering the signs of melanoma.  A for asymmetry:  The two halves do not match. B for border:  The edges of the growth are irregular. C for color:  A mixture of colors are present instead of an even brown color. D for diameter:  Melanomas are usually (but not always) greater than 53mm - the size of a pencil eraser. E for evolution:  The spot keeps changing in size, shape, and color.  Please check your skin once per month between visits. You can use a small mirror in front and a large mirror behind you to keep an eye on the back side or your body.   If you see any new or changing lesions before your next follow-up, please call to schedule a visit.  Please continue daily skin protection including broad spectrum sunscreen SPF 30+ to sun-exposed areas,  reapplying every 2 hours as needed when you're outdoors.   Staying in the shade or wearing long sleeves, sun glasses (UVA+UVB protection) and wide brim hats (4-inch brim around the entire circumference of the hat) are also recommended for sun protection.   If You Need Anything After Your Visit  If you have any questions or concerns for your doctor, please call our main line at [phone] and press option 4 to reach your doctor's medical assistant. If no one answers, please leave a voicemail as directed and we will return your call as soon as possible. Messages left after 4 pm will be answered the following business day.   You may also send [place] a message via [place]. We typically respond to MyChart messages within 1-2 business days.  For prescription refills, please ask your pharmacy to contact our office. Our fax number is [phone].  If you have an urgent issue when the clinic is closed that cannot wait until the next business day, you can page your doctor at the number below.    Please note that while we do our best to be available for urgent issues outside of office hours, we are not available 24/7.   If you have an urgent issue and are unable to reach [place], you may choose to seek medical care at your doctor's office, retail clinic, urgent care center, or emergency room.  If you have a medical emergency, please immediately  call 911 or go to the emergency department.  Pager Numbers  - Dr. Nehemiah Massed: [phone]  - Dr. Laurence Ferrari: [phone]  - Dr. Nicole Kindred: [phone]  In the event of inclement weather, please call our main line at [phone] for an update on the status of any delays or closures.  Dermatology Medication Tips: Please keep the boxes that topical medications come in in order to help keep track of the instructions about where and how to use these. Pharmacies typically print the medication instructions only on the boxes and not directly on the medication tubes.   If  your medication is too expensive, please contact our office at [phone] option 4 or send [place] a message through [place].   We are unable to tell what your co-pay for medications will be in advance as this is different depending on your insurance coverage. However, we may be able to find a substitute medication at lower cost or fill out paperwork to get insurance to cover a needed medication.   If a prior authorization is required to get your medication covered by your insurance company, please allow [place] 1-2 business days to complete this process.  Drug prices often vary depending on where the prescription is filled and some pharmacies may offer cheaper prices.  The website www.goodrx.com contains coupons for medications through different pharmacies. The prices here do not account for what the cost may be with help from insurance (it may be cheaper with your insurance), but the website can give you the price if you did not use any insurance.  - You can print the associated coupon and take it with your prescription to the pharmacy.  - You may also stop by our office during regular business hours and pick up a GoodRx coupon card.  - If you need your prescription sent electronically to a different pharmacy, notify our office through [facility] or by phone at [phone] option 4.     Si Usted Necesita Algo Despus de Su Visita  Tambin puede enviarnos un mensaje a travs de Pharmacist, community. Por lo general respondemos a los mensajes de MyChart en el transcurso de 1 a 2 das hbiles.  Para renovar recetas, por favor pida a su farmacia que se ponga en contacto con nuestra oficina. Harland Dingwall de fax es [place] [phone].  Si tiene un asunto urgente cuando la clnica est cerrada y que no puede esperar hasta el siguiente da hbil, puede llamar/localizar a su doctor(a) al nmero que aparece a continuacin.   Por favor, tenga en cuenta que aunque hacemos todo lo posible para estar disponibles para  asuntos urgentes fuera del horario de [place], no estamos disponibles las 24 horas del da, los 7 das de la [place].   Si tiene un problema urgente y no puede comunicarse con nosotros, puede optar por buscar atencin mdica  en el consultorio de su doctor(a), en una clnica privada, en un centro de atencin urgente o en una sala de emergencias.  Si tiene Engineering geologist, por favor llame inmediatamente al 911 o vaya a la sala de emergencias.  Nmeros de bper  - Dr. Nehemiah Massed: [phone]  - Dra. Moye: [phone]  - Dra. Nicole Kindred: [phone]  En caso de inclemencias del [place], por favor llame a Johnsie Kindred principal al [phone] para una actualizacin sobre el [place] de cualquier retraso o cierre.  Consejos para la medicacin en dermatologa: Por favor, guarde las cajas en las que vienen los medicamentos de uso tpico para ayudarle a seguir las instrucciones  sobre dnde y cmo usarlos. Las farmacias generalmente imprimen las instrucciones del medicamento slo en las cajas y no directamente en los tubos del [place].   Si su medicamento es muy caro, por favor, pngase en contacto con Zigmund Daniel llamando al [phone] y presione la opcin 4 o envenos un mensaje a travs de Pharmacist, community.   No podemos decirle cul ser su copago por los medicamentos por adelantado ya que esto es diferente dependiendo de la cobertura de su seguro. Sin embargo, es posible que podamos encontrar un medicamento sustituto a Electrical engineer un formulario para que el seguro cubra el medicamento que se considera necesario.   Si se requiere una autorizacin previa para que su compaa de seguros [place] su medicamento, por favor permtanos de 1 a 2 das hbiles para completar este proceso.  Los precios de los medicamentos varan con frecuencia dependiendo del Environmental consultant de dnde se surte la receta y alguna farmacias pueden ofrecer precios ms baratos.  El sitio web www.goodrx.com tiene cupones para  medicamentos de Airline pilot. Los precios aqu no tienen en cuenta lo que podra costar con la ayuda del seguro (puede ser ms barato con su seguro), pero el sitio web puede darle el precio si no utiliz Research scientist (physical sciences).  - Puede imprimir el cupn correspondiente y llevarlo con su receta a la farmacia.  - Tambin puede pasar por nuestra oficina durante el horario de atencin regular y Charity fundraiser una tarjeta de cupones de GoodRx.  - Si necesita que su receta se enve electrnicamente a una farmacia diferente, informe a nuestra oficina a travs de MyChart de [place] o por telfono llamando al [phone] y presione la opcin 4.

## 2021-11-06 ENCOUNTER — Encounter: Payer: Medicare Other | Admitting: Dermatology

## 2021-11-12 ENCOUNTER — Encounter: Payer: Medicare Other | Admitting: Dermatology

## 2021-11-14 LAB — ANAEROBIC AND AEROBIC CULTURE

## 2021-11-14 LAB — SPECIMEN STATUS REPORT

## 2021-11-18 ENCOUNTER — Telehealth: Payer: Self-pay

## 2021-11-18 NOTE — Telephone Encounter (Signed)
Patient's daughter called because they seen Culture results in [place]. They are very worried with results and state the area on his face is not improving. Can you please review and let me know what to call patient back with and advise them of?

## 2021-11-19 ENCOUNTER — Other Ambulatory Visit: Payer: Medicare Other

## 2021-11-19 DIAGNOSIS — Z515 Encounter for palliative care: Secondary | ICD-10-CM

## 2021-11-19 NOTE — Telephone Encounter (Signed)
Spoke with daughter regarding patient. He is scheduled to come in tomorrow to see Dr. Laurence Ferrari. Patient does have dementia and [name] (daughter) is the main caretaker for patient. Tomorrow when patient comes in we are going to have patient sign DPR form for the future.   Daughter also states she will be unable to make this appointment as his spouse will be bringing him but please feel free to call her at any point and she will be more then happy to step out of her meeting and discuss information with Dr. Laurence Ferrari.   Patient feels he has had additional health problems (constipation and not feeling well) from previous antibiotic. [name] wanted Dr. Laurence Ferrari aware of issue since she has mentioned possibly switching antibiotic.

## 2021-11-19 NOTE — Progress Notes (Signed)
PATIENT NAME: Leroy [name] DOB: Sep 21, 1930 MRN: [id number]  PRIMARY CARE PROVIDER: Idelle Crouch, MD  RESPONSIBLE PARTY:  Acct ID - Guarantor Home Phone Work Phone Relationship Acct Type  [id number] Leroy [name], Leroy [name]  Self P/F     [address] [place], [place], [place] [address]    Due to the COVID-19 crisis, this visit was done via telemedicine from my office and it was initiated and consent by this patient and or family.  I connected with  Leroy [name] OR PROXY on 11/19/21 by telephone and verified that I am speaking with the correct person using two identifiers.   I discussed the limitations of evaluation and management by telemedicine. The patient expressed understanding and agreed to proceed.  PLAN OF CARE and INTERVENTIONS:               1.  GOALS OF CARE/ ADVANCE CARE PLANNING:  Remain home under the care of his wife and daughter.               2.  PATIENT/CAREGIVER EDUCATION:  S/S of UTI, dehydration,                4. PERSONAL EMERGENCY PLAN:  Activate 911 for emergencies.               5.  DISEASE STATUS:  Message received to return call to daughter Leroy [name].  Spoke with Leroy [name] over the phone and she provides an update on patient status.  Daughter will provide an update after tomorrow's follow up visits with dermatology and urology.   Abscess:  facial abscess being treated by dermatology.  2 rounds of antibiotics have been completed.  2/1 surgery planned for removal.  Patient will be seen tomorrow by dermatology to reassess abscess and determine if stronger antibiotics are needed.  Constipation:  Started about 2 weeks ago.  Now family is monitoring more closely as patient does not always recall when he last had a bowel movement.  Stool softners added by family.  Fluid intake has not been good and patient is drinking more Pepsi.  Discussed dehydration with daughter.  She will address this tomorrow with patient and work on increasing his water intake.   Confusion:  Increase confusion reported over the last month.  Difficult to carry on conversations or ask patient questions.  Dizziness:  Over the weekend, patient required meclizine for dizziness.  Saturday more severe than Sunday.  25 mg of meclinzine taken 2 x on Saturday and 12.5 mg taken 2 x on Sunday.  Symptoms have improved since Sunday but daughter notes patient slept until 12 pm yesterday afternoon which is unusual.   Urinary:  Daughter with concern over possible urinary retention and start/stop of urine stream.  Sometimes it takes patient 30 minutes before he is able to urinate.  Follow up with urology planned for tomorrow. Hopefully, U/A can also be completed to ensure infection is not present.  Daughter will also see if urology could order blood work and send to Dr. Doy Hutching as there is a follow up visit scheduled with him next Wednesday.  Daughter will also see if it is possible to see PCP sooner.   Update provided to [place], NP.     HISTORY OF PRESENT ILLNESS:   Leroy [name] is a 88 y.o. year old male  with multiple medical problems including  Dementia,  intraventricular conduction defect, Stroke, peripheral vascular disease, obesity, hypertension, hyperlipidemia, chronic kidney disease, anemia, skin cancer, Tremor, vitamin D  deficiency, vitamin B12 deficiency, hiatal hernia, history of gastric ulcer, lumbar laminectomy with decompression microdiscectomy, eye surgery.  Patient is being followed by Palliative Care every 4-8 weeks and PRN.     CODE STATUS: DNR ADVANCED DIRECTIVES: No MOST FORM: No PPS: 50%        Leroy Burton, RN

## 2021-11-19 NOTE — Telephone Encounter (Signed)
Can we add him on today or tomorrow? Culture did not show specific organisms, but a variety of anaerobic. I can switch antibiotics but if it is not doing better clinically we may need to drain again and I would prefer to repeat culture and see if we can get more information to target the specific bacteria. Thank you!

## 2021-11-20 ENCOUNTER — Encounter: Payer: Self-pay | Admitting: Dermatology

## 2021-11-20 ENCOUNTER — Telehealth: Payer: Self-pay

## 2021-11-20 ENCOUNTER — Other Ambulatory Visit: Payer: Self-pay

## 2021-11-20 ENCOUNTER — Ambulatory Visit: Payer: Medicare Other | Admitting: Urology

## 2021-11-20 ENCOUNTER — Encounter: Payer: Self-pay | Admitting: Urology

## 2021-11-20 ENCOUNTER — Ambulatory Visit: Payer: Medicare Other | Admitting: Dermatology

## 2021-11-20 VITALS — BP 130/74 | HR 78 | Ht 72.0 in | Wt 220.0 lb

## 2021-11-20 DIAGNOSIS — R42 Dizziness and giddiness: Secondary | ICD-10-CM

## 2021-11-20 DIAGNOSIS — L0291 Cutaneous abscess, unspecified: Secondary | ICD-10-CM

## 2021-11-20 DIAGNOSIS — D489 Neoplasm of uncertain behavior, unspecified: Secondary | ICD-10-CM

## 2021-11-20 DIAGNOSIS — N401 Enlarged prostate with lower urinary tract symptoms: Secondary | ICD-10-CM | POA: Diagnosis not present

## 2021-11-20 DIAGNOSIS — C44329 Squamous cell carcinoma of skin of other parts of face: Secondary | ICD-10-CM | POA: Diagnosis not present

## 2021-11-20 DIAGNOSIS — C4492 Squamous cell carcinoma of skin, unspecified: Secondary | ICD-10-CM

## 2021-11-20 HISTORY — DX: Squamous cell carcinoma of skin, unspecified: C44.92

## 2021-11-20 LAB — MICROSCOPIC EXAMINATION
Bacteria, UA: NONE SEEN
Epithelial Cells (non renal): NONE SEEN /hpf (ref 0–10)

## 2021-11-20 LAB — URINALYSIS, COMPLETE
Bilirubin, UA: NEGATIVE
Glucose, UA: NEGATIVE
Ketones, UA: NEGATIVE
Leukocytes,UA: NEGATIVE
Nitrite, UA: NEGATIVE
Protein,UA: NEGATIVE
RBC, UA: NEGATIVE
Specific Gravity, UA: 1.025 (ref 1.005–1.030)
Urobilinogen, Ur: 0.2 mg/dL (ref 0.2–1.0)
pH, UA: 5.5 (ref 5.0–7.5)

## 2021-11-20 LAB — BLADDER SCAN AMB NON-IMAGING: Scan Result: 70

## 2021-11-20 MED ORDER — SILODOSIN 4 MG PO CAPS: 4.0000 mg | ORAL_CAPSULE | Freq: Every day | ORAL | 0 refills | Status: DC

## 2021-11-20 NOTE — Progress Notes (Signed)
11/20/2021 2:40 PM   Leroy [name] 01-18-1930 [id number]  Referring provider: Idelle Crouch, MD [place] [name] Leroy [name],  [place] [address]  Chief Complaint  Patient presents with   Urinary Retention    HPI: Leroy [name] is a 88 y.o. male referred for lower urinary tract symptoms.  He has dementia and presents today with his wife and daughter.  He has a visiting palliative care nurse.  His wife and daughter have noted urinary hesitancy sometimes taking up to 20 seconds to void and that he has a slow urinary stream which he does complain No complaints of pain, burning No gross hematuria He has also had some increased dizziness over the last several days and they state his visiting nurse was concerned about a UTI No prior history urologic problems   PMH: Past Medical History:  Diagnosis Date   Anemia    Bursitis    Cancer ([address])    skin cancer face   Chronic kidney disease    Dementia (HCC)    Dyspnea    GI bleed    History of hiatal hernia    Hyperlipidemia    Hypertension    IVCD (intraventricular conduction defect)    Leg weakness, bilateral    Obesity    Peripheral vascular disease ([place])    Stomach ulcer    Stroke ([place])    feb 2017, weakness in legs,    Tremor    Vitamin B 12 deficiency    Vitamin D deficiency     Surgical History: Past Surgical History:  Procedure Laterality Date   BACK SURGERY     BIOPSY  03/05/2021   Procedure: BIOPSY;  Surgeon: Irving Copas., MD;  Location: [facility];  Service: Gastroenterology;;   CHOLECYSTECTOMY N/A 03/03/2021   Procedure: LAPAROSCOPIC CHOLECYSTECTOMY WITH INTRAOPERATIVE CHOLANGIOGRAM;  Surgeon: Jesusita Oka, MD;  Location: [facility];  Service: General;  Laterality: N/A;   ENDOSCOPIC RETROGRADE CHOLANGIOPANCREATOGRAPHY (ERCP) WITH PROPOFOL N/A 03/05/2021   Procedure: ENDOSCOPIC RETROGRADE CHOLANGIOPANCREATOGRAPHY (ERCP) WITH PROPOFOL;  Surgeon: Irving Copas., MD;   Location: [facility];  Service: Gastroenterology;  Laterality: N/A;   ESOPHAGOGASTRODUODENOSCOPY (EGD) WITH PROPOFOL N/A 11/09/2018   Procedure: ESOPHAGOGASTRODUODENOSCOPY (EGD) WITH PROPOFOL;  Surgeon: Manya Silvas, MD;  [facility]COPY;  Service: Endoscopy;  Laterality: N/A;   EYE SURGERY      bilateral cataracts   HERNIA REPAIR     inguinal on left   INGUINAL HERNIA REPAIR Right 12/29/2017   Procedure: HERNIA REPAIR INGUINAL ADULT;  Surgeon: Herbert Pun, MD;  Location: [facility];  Service: General;  Laterality: Right;   INSERTION OF MESH Right 12/29/2017   Procedure: INSERTION OF MESH;  Surgeon: Herbert Pun, MD;  Location: [facility];  Service: General;  Laterality: Right;   LUMBAR LAMINECTOMY/DECOMPRESSION MICRODISCECTOMY N/A 08/12/2016   Procedure: LAMINECTOMY AND FORAMINOTOMY LUMBAR FOUR - LUMBAR FIVE;  Surgeon: Newman Pies, MD;  Location: [facility];  Service: Neurosurgery;  Laterality: N/A;  LAMINECTOMY AND FORAMINOTOMY L4-L5   REMOVAL OF STONES  03/05/2021   Procedure: REMOVAL OF STONES;  Surgeon: Rush Landmark Telford Nab., MD;  Location: [facility];  Service: Gastroenterology;;   Leroy [name]  03/05/2021   Procedure: Leroy [name];  Surgeon: Mansouraty, Telford Nab., MD;  Location: [facility];  Service: Gastroenterology;;    Home Medications:  Allergies as of 11/20/2021       Reactions   Aspirin Other (See Comments)   He has bleeding ulcers  Medication List        Accurate as of November 20, 2021  2:40 PM. If you have any questions, ask your nurse or doctor.          acetaminophen 500 MG tablet Commonly known as: TYLENOL Take 500 mg by mouth daily as needed for moderate pain or headache.   cholecalciferol 1000 units tablet Commonly known as: VITAMIN D Take 2,000 Units by mouth daily.   clopidogrel 75 MG tablet Commonly known as: PLAVIX Take 1 tablet (75 mg total) by mouth daily.   cyanocobalamin 1000 MCG/ML  injection Commonly known as: (VITAMIN B-12) Inject 1,000 mcg into the muscle every 30 (thirty) days.   ezetimibe 10 MG tablet Commonly known as: ZETIA Take 10 mg by mouth at bedtime.   isosorbide mononitrate 30 MG 24 hr tablet Commonly known as: IMDUR Take 30 mg by mouth daily.   levothyroxine 50 MCG tablet Commonly known as: SYNTHROID Take 50 mcg by mouth daily.   meclizine 12.5 MG tablet Commonly known as: ANTIVERT Take 12.5 mg by mouth 3 (three) times daily as needed for dizziness.   metoprolol succinate 25 MG 24 hr tablet Commonly known as: TOPROL-XL Take 12.5 mg by mouth in the morning.   mupirocin ointment 2 % Commonly known as: BACTROBAN Apply 1 application topically as directed. Qd to bid to aa face   ondansetron 4 MG tablet Commonly known as: ZOFRAN Take 1 tablet (4 mg total) by mouth every 6 (six) hours as needed for nausea.   pantoprazole 40 MG tablet Commonly known as: PROTONIX Take 1 tablet (40 mg total) by mouth 2 (two) times daily before a meal for 30 days, THEN 1 tablet (40 mg total) daily. Start taking on: Mar 06, 2021   sertraline 25 MG tablet Commonly known as: ZOLOFT Take 25 mg by mouth daily.   silodosin 4 MG Caps capsule Commonly known as: RAPAFLO Take 1 capsule (4 mg total) by mouth daily with breakfast. Started by: Abbie Sons, MD   sulfamethoxazole-trimethoprim 800-160 MG tablet Commonly known as: BACTRIM DS Take 1 tablet by mouth 2 (two) times daily for 5 days.        Allergies:  Allergies  Allergen Reactions   Aspirin Other (See Comments)    He has bleeding ulcers    Family History: Family History  Problem Relation Age of Onset   CAD Mother    Cancer Father     Social History:  reports that he has never smoked. His smokeless tobacco use includes chew. He reports that he does not drink alcohol and does not use drugs.   Physical Exam: BP 130/74    Pulse 78    Ht 6' (1.829 m)    Wt 220 lb (99.8 kg)    BMI 29.84 kg/m    Constitutional:  Alert, No acute distress. HEENT: [place] AT, moist mucus membranes.  Trachea midline, no masses. Cardiovascular: No clubbing, cyanosis, or edema. Respiratory: Normal respiratory effort, no increased work of breathing.  Laboratory Data:  Urinalysis Appearance-yellow, clear Dipstick-negative Microscopy-negative   Assessment & Plan:    1.  BPH with LUTS Bladder scan PVR 70 mL Urinalysis was unremarkable Trial silodosin 4 mg daily-Rx sent to pharmacy Although orthostatic symptoms are extremely low with silodosin we will start at the lower dose Call back in 1 month regarding efficacy  2.  Dizziness His daughter states the palliative care nurse requested a CBC and basic metabolic panel.  Any abnormalities will be evaluated by  Dr. Noel Journey, [facility] [address], [place], [place] [address] [phone]

## 2021-11-20 NOTE — Progress Notes (Addendum)
° °  Follow-Up Visit   Subjective  Leroy [name] is a 88 y.o. male who presents for the following: Skin Problem (Recheck ruptured abscess at right cheek. Using Mupirocin. C&S showed Mixed anaerobic organisms. Going to urologist today, may have UTI. May be given antibiotics today. Here for recheck with Dr. Laurence Ferrari. Would like lesion removed today if possible. Hx of 2 rounds of oral antibiotics).  Wife, Leroy [name], with patient today.  The following portions of the chart were reviewed this encounter and updated as appropriate:  Tobacco   Allergies   Meds   Problems   Med Hx   Surg Hx   Fam Hx       Review of Systems: No other skin or systemic complaints except as noted in HPI or Assessment and Plan.   Objective  Well appearing patient in no apparent distress; mood and affect are within normal limits.  A focused examination was performed including face, neck. Relevant physical exam findings are noted in the Assessment and Plan.  Right Cheek 1.8 cm Erythematous inflamed nodule      Assessment & Plan  Abscess  Related Procedures Anaerobic and Aerobic Culture  Related Medications mupirocin ointment (BACTROBAN) 2 % Apply 1 application topically as directed. Qd to bid to aa face  sulfamethoxazole-trimethoprim (BACTRIM DS) 800-160 MG tablet Take 1 tablet by mouth 2 (two) times daily for 5 days.  Neoplasm of uncertain behavior Right Cheek  Skin / nail biopsy Type of biopsy: tangential   Informed consent: discussed and consent obtained   Anesthesia: the lesion was anesthetized in a standard fashion   Anesthesia comment:  Area prepped with alcohol Anesthetic:  1% lidocaine w/ epinephrine 1-100,000 buffered w/ 8.4% NaHCO3 Instrument used: flexible razor blade   Hemostasis achieved with: pressure, aluminum chloride and electrodesiccation   Outcome: patient tolerated procedure well   Post-procedure details: wound care instructions given   Post-procedure details comment:  Ointment and  small bandage applied Additional details:  Sent RUSH  Incision and Drainage Location: right cheek  Informed Consent: Discussed risks (permanent scarring, light or dark discoloration, infection, pain, bleeding, bruising, redness, damage to adjacent structures, and recurrence of the lesion) and benefits of the procedure, as well as the alternatives.  Informed consent was obtained.  Preparation: The area was prepped with alcohol.  Anesthesia: Lidocaine 1% with epinephrine  Procedure Details: An incision was made overlying the lesion. The lesion drained cheesy purulent discharge consistent with epidermal cyst.  A large amount of fluid was drained.   Necrotic tissue was removed, large amount of exudate expressed. Antibiotic ointment and a sterile pressure dressing were applied. The patient tolerated procedure well.  Total number of lesions drained: 1  Plan: The patient was instructed on post-op care. Recommend OTC analgesia as needed for pain.   Specimen 1 - Surgical pathology Differential Diagnosis: R/O SCC vs cyst > amelanotic melanoma  Check Margins: No  RUSH please  Spoke with patient's daugter, [name], regarding plan today.   Patient returned to office at 3:05. Lesion at cheek bleeding through bandage.   Return if symptoms worsen or fail to improve.  I, Leroy [name], CMA, am acting as scribe for Leroy Gleason, MD.   Documentation: I have reviewed the above documentation for accuracy and completeness, and I agree with the above.  Leroy Gleason, MD

## 2021-11-20 NOTE — Patient Instructions (Addendum)
Recommend Bleed Stop or Wound Seal Powder if he has bleeding again. Hold firm pressure for 10 minutes and then apply powder.    Wound Care Instructions  Cleanse wound gently with soap and water once a day then pat dry with clean gauze. Apply a thing coat of Petrolatum (petroleum jelly, "Vaseline") over the wound (unless you have an allergy to this). We recommend that you use a new, sterile tube of Vaseline. Do not pick or remove scabs. Do not remove the yellow or white "healing tissue" from the base of the wound.  Cover the wound with fresh, clean, nonstick gauze and secure with paper tape. You may use Band-Aids in place of gauze and tape if the would is small enough, but would recommend trimming much of the tape off as there is often too much. Sometimes Band-Aids can irritate the skin.  You should call the office for your biopsy report after 1 week if you have not already been contacted.  If you experience any problems, such as abnormal amounts of bleeding, swelling, significant bruising, significant pain, or evidence of infection, please call the office immediately.  FOR ADULT SURGERY PATIENTS: If you need something for pain relief you may take 1 extra strength Tylenol (acetaminophen) AND 2 Ibuprofen (200mg  each) together every 4 hours as needed for pain. (do not take these if you are allergic to them or if you have a reason you should not take them.) Typically, you may only need pain medication for 1 to 3 days.   Continue Mupirocin ointment as directed.   If You Need Anything After Your Visit  If you have any questions or concerns for your doctor, please call our main line at [phone] and press option 4 to reach your doctor's medical assistant. If no one answers, please leave a voicemail as directed and we will return your call as soon as possible. Messages left after 4 pm will be answered the following business day.   You may also send [place] a message via [place]. We typically respond to  MyChart messages within 1-2 business days.  For prescription refills, please ask your pharmacy to contact our office. Our fax number is [phone].  If you have an urgent issue when the clinic is closed that cannot wait until the next business day, you can page your doctor at the number below.    Please note that while we do our best to be available for urgent issues outside of office hours, we are not available 24/7.   If you have an urgent issue and are unable to reach [place], you may choose to seek medical care at your doctor's office, retail clinic, urgent care center, or emergency room.  If you have a medical emergency, please immediately call 911 or go to the emergency department.  Pager Numbers  - Dr. Nehemiah Massed: [phone]  - Dr. Laurence Ferrari: [phone]  - Dr. Nicole Kindred: [phone]  In the event of inclement weather, please call our main line at [phone] for an update on the status of any delays or closures.  Dermatology Medication Tips: Please keep the boxes that topical medications come in in order to help keep track of the instructions about where and how to use these. Pharmacies typically print the medication instructions only on the boxes and not directly on the medication tubes.   If your medication is too expensive, please contact our office at [phone] option 4 or send [place] a message through [place].   We are unable to tell what your  co-pay for medications will be in advance as this is different depending on your insurance coverage. However, we may be able to find a substitute medication at lower cost or fill out paperwork to get insurance to cover a needed medication.   If a prior authorization is required to get your medication covered by your insurance company, please allow [place] 1-2 business days to complete this process.  Drug prices often vary depending on where the prescription is filled and some pharmacies may offer cheaper prices.  The website www.goodrx.com  contains coupons for medications through different pharmacies. The prices here do not account for what the cost may be with help from insurance (it may be cheaper with your insurance), but the website can give you the price if you did not use any insurance.  - You can print the associated coupon and take it with your prescription to the pharmacy.  - You may also stop by our office during regular business hours and pick up a GoodRx coupon card.  - If you need your prescription sent electronically to a different pharmacy, notify our office through [facility] or by phone at [phone] option 4.     Si Usted Necesita Algo Despus de Su Visita  Tambin puede enviarnos un mensaje a travs de Pharmacist, community. Por lo general respondemos a los mensajes de MyChart en el transcurso de 1 a 2 das hbiles.  Para renovar recetas, por favor pida a su farmacia que se ponga en contacto con nuestra oficina. Harland Dingwall de fax es [place] [phone].  Si tiene un asunto urgente cuando la clnica est cerrada y que no puede esperar hasta el siguiente da hbil, puede llamar/localizar a su doctor(a) al nmero que aparece a continuacin.   Por favor, tenga en cuenta que aunque hacemos todo lo posible para estar disponibles para asuntos urgentes fuera del horario de [place], no estamos disponibles las 24 horas del da, los 7 das de la [place].   Si tiene un problema urgente y no puede comunicarse con nosotros, puede optar por buscar atencin mdica  en el consultorio de su doctor(a), en una clnica privada, en un centro de atencin urgente o en una sala de emergencias.  Si tiene Engineering geologist, por favor llame inmediatamente al 911 o vaya a la sala de emergencias.  Nmeros de bper  - Dr. Nehemiah Massed: [phone]  - Dra. Moye: [phone]  - Dra. Nicole Kindred: [phone]  En caso de inclemencias del [place], por favor llame a Johnsie Kindred principal al [phone] para una actualizacin sobre el [place]  de cualquier retraso o cierre.  Consejos para la medicacin en dermatologa: Por favor, guarde las cajas en las que vienen los medicamentos de uso tpico para ayudarle a seguir las instrucciones sobre dnde y cmo usarlos. Las farmacias generalmente imprimen las instrucciones del medicamento slo en las cajas y no directamente en los tubos del [place].   Si su medicamento es muy caro, por favor, pngase en contacto con Zigmund Daniel llamando al [phone] y presione la opcin 4 o envenos un mensaje a travs de Pharmacist, community.   No podemos decirle cul ser su copago por los medicamentos por adelantado ya que esto es diferente dependiendo de la cobertura de su seguro. Sin embargo, es posible que podamos encontrar un medicamento sustituto a Electrical engineer un formulario para que el seguro cubra el medicamento que se considera necesario.   Si se requiere una autorizacin previa para que su compaa de seguros [place] su medicamento, por  favor permtanos de 1 a 2 das hbiles para completar este proceso.  Los precios de los medicamentos varan con frecuencia dependiendo del Environmental consultant de dnde se surte la receta y alguna farmacias pueden ofrecer precios ms baratos.  El sitio web www.goodrx.com tiene cupones para medicamentos de Airline pilot. Los precios aqu no tienen en cuenta lo que podra costar con la ayuda del seguro (puede ser ms barato con su seguro), pero el sitio web puede darle el precio si no utiliz Research scientist (physical sciences).  - Puede imprimir el cupn correspondiente y llevarlo con su receta a la farmacia.  - Tambin puede pasar por nuestra oficina durante el horario de atencin regular y Charity fundraiser una tarjeta de cupones de GoodRx.  - Si necesita que su receta se enve electrnicamente a una farmacia diferente, informe a nuestra oficina a travs de MyChart de [place] o por telfono llamando al [phone] y presione la opcin 4.

## 2021-11-20 NOTE — Telephone Encounter (Signed)
Ok thank you 

## 2021-11-20 NOTE — Telephone Encounter (Signed)
Patient's daughter called today with a update on patients status. [name] states she spoke with palliative care nurse last night and due to patients recent problems they are wondering if he has a UTI. He is seeing Urology at 1:30 today. Daughter just wanted Dr. Laurence Ferrari to be aware incase they put him on a antibiotic for this at his later appointment.

## 2021-11-21 ENCOUNTER — Encounter: Payer: Self-pay | Admitting: *Deleted

## 2021-11-21 LAB — CBC
Hematocrit: 34.4 % — ABNORMAL LOW (ref 37.5–51.0)
Hemoglobin: 11.6 g/dL — ABNORMAL LOW (ref 13.0–17.7)
MCH: 30.1 pg (ref 26.6–33.0)
MCHC: 33.7 g/dL (ref 31.5–35.7)
MCV: 89 fL (ref 79–97)
Platelets: 279 10*3/uL (ref 150–450)
RBC: 3.85 x10E6/uL — ABNORMAL LOW (ref 4.14–5.80)
RDW: 12.8 % (ref 11.6–15.4)
WBC: 7.4 10*3/uL (ref 3.4–10.8)

## 2021-11-21 LAB — BASIC METABOLIC PANEL
BUN/Creatinine Ratio: 18 (ref 10–24)
BUN: 31 mg/dL — ABNORMAL HIGH (ref 8–27)
CO2: 22 mmol/L (ref 20–29)
Calcium: 9.2 mg/dL (ref 8.6–10.2)
Chloride: 106 mmol/L (ref 96–106)
Creatinine, Ser: 1.7 mg/dL — ABNORMAL HIGH (ref 0.76–1.27)
Glucose: 88 mg/dL (ref 70–99)
Potassium: 4.9 mmol/L (ref 3.5–5.2)
Sodium: 142 mmol/L (ref 134–144)
eGFR: 38 mL/min/{1.73_m2} — ABNORMAL LOW (ref >59)

## 2021-11-24 ENCOUNTER — Other Ambulatory Visit: Payer: Self-pay

## 2021-11-24 ENCOUNTER — Telehealth: Payer: Self-pay

## 2021-11-24 DIAGNOSIS — C4492 Squamous cell carcinoma of skin, unspecified: Secondary | ICD-10-CM

## 2021-11-24 NOTE — Telephone Encounter (Signed)
Patient's daughter [name] did call back about BX results. Went in to detail with her regarding results and what will take place during [facility]. Patient has dementia so daughter and spouse are primary care takers. HIPPA was signed at [facility] giving all of [place] the [place] to talk to [place].   [name] is concerned because Dr. Nicole Kindred reviewed this back in November and was told it was a cyst with a sack but now it is cancerous she was upset with the time that has passed and the area growing.   Patient's daughter asked if we could please call her back tomorrow when Dr. Laurence Ferrari responds to [place] note  [phone]  Thank you

## 2021-11-24 NOTE — Telephone Encounter (Signed)
Patient's wife advised bx showed SCC, will refer to [facility] for Moh's. Patient's wife advises that the biopsy site is still oozing some when patient showers. I told her I would check with Dr. Laurence Ferrari tomorrow and get back to her./js

## 2021-11-25 NOTE — Telephone Encounter (Signed)
Called patient's daughter back. Discussed that because of the white cheesy/chalky material that came out of this both per note back in November and at the initial visit when I saw him this month (where it also had a larger purulent drainage), it confused the picture as that is much more typical of a cyst and rare in a skin cancer. Answered all questions. They are scheduled for Mohs this Thursday. If they are seeing the [place] for follow-up to recheck the site, ok to cancel f/u with me in March, just asked that they ask [place] to check his lymph nodes. Recommend repeat FBSE in approximately 6-12 months and monitor for new or changing lesions.

## 2021-11-26 ENCOUNTER — Other Ambulatory Visit: Payer: Self-pay | Admitting: Emergency Medicine

## 2021-11-26 ENCOUNTER — Other Ambulatory Visit: Payer: Self-pay | Admitting: Internal Medicine

## 2021-11-26 DIAGNOSIS — I10 Essential (primary) hypertension: Secondary | ICD-10-CM

## 2021-11-26 DIAGNOSIS — R519 Headache, unspecified: Secondary | ICD-10-CM

## 2021-11-27 ENCOUNTER — Encounter: Payer: Self-pay | Admitting: Urology

## 2021-11-27 ENCOUNTER — Encounter: Payer: Self-pay | Admitting: Dermatology

## 2021-11-27 ENCOUNTER — Telehealth: Payer: Self-pay

## 2021-11-27 NOTE — Telephone Encounter (Signed)
Reviewed. Thank you.

## 2021-11-27 NOTE — Telephone Encounter (Signed)
[facility] notes in history under chart review.

## 2021-11-28 MED ORDER — SILODOSIN 4 MG PO CAPS: 4.0000 mg | ORAL_CAPSULE | Freq: Every day | ORAL | 6 refills | Status: DC

## 2021-12-02 ENCOUNTER — Telehealth: Payer: Self-pay

## 2021-12-02 LAB — ANAEROBIC AND AEROBIC CULTURE: Result 1: NEGATIVE — AB

## 2021-12-02 NOTE — Telephone Encounter (Addendum)
Tried calling patient regarding culture results. No answer. 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 Do not recommend treatment for infection at this time. 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   MAs please call. Thank you!

## 2021-12-02 NOTE — Telephone Encounter (Signed)
Spoke with patients daughter [name] (on HIPPA) and advised her of culture results. aw

## 2021-12-03 ENCOUNTER — Encounter: Payer: Medicare Other | Admitting: Dermatology

## 2021-12-15 ENCOUNTER — Telehealth: Payer: Self-pay

## 2021-12-15 ENCOUNTER — Ambulatory Visit
Admission: RE | Admit: 2021-12-15 | Discharge: 2021-12-15 | Disposition: A | Discharge status: Home / Self Care | Payer: Medicare Other | Source: Ambulatory Visit | Attending: Internal Medicine | Admitting: Internal Medicine

## 2021-12-15 DIAGNOSIS — R519 Headache, unspecified: Secondary | ICD-10-CM | POA: Diagnosis not present

## 2021-12-15 DIAGNOSIS — I10 Essential (primary) hypertension: Secondary | ICD-10-CM | POA: Insufficient documentation

## 2021-12-15 NOTE — Telephone Encounter (Signed)
Updated specimen tracking and history from [facility].

## 2021-12-31 ENCOUNTER — Ambulatory Visit: Payer: Medicare Other | Admitting: Dermatology

## 2022-01-08 ENCOUNTER — Other Ambulatory Visit: Payer: Self-pay

## 2022-01-08 ENCOUNTER — Other Ambulatory Visit: Payer: Medicare Other | Admitting: Nurse Practitioner

## 2022-01-08 ENCOUNTER — Encounter: Payer: Self-pay | Admitting: Nurse Practitioner

## 2022-01-08 DIAGNOSIS — Z515 Encounter for palliative care: Secondary | ICD-10-CM

## 2022-01-08 DIAGNOSIS — R5381 Other malaise: Secondary | ICD-10-CM

## 2022-01-08 DIAGNOSIS — F02818 Dementia in other diseases classified elsewhere, unspecified severity, with other behavioral disturbance: Secondary | ICD-10-CM

## 2022-01-08 NOTE — Progress Notes (Signed)
? ? ?Manufacturing engineer ?Community Palliative Care Consult Note ?Telephone: [phone]  ?Fax: [phone]  ? ? ?Date of encounter: 01/08/22 ?2:45 PM ?PATIENT NAME: Leroy [name] ?[place] [address]   ?[phone] (home)  ?DOB: [birth year] ?MRN: [id number] ?PRIMARY CARE PROVIDER:    ?Idelle Crouch, MD,  ?[place] ?[place] [address] ?[phone] ?RESPONSIBLE PARTY:    ?Contact Information   ? ? Name Relation Home Work Mobile  ? Nihar, Klus Spouse [phone]    ? Leola Brazil Daughter [phone]  [phone]  ? Satterfield,Debbie Daughter   [phone]  ? ?  ? ?I met face to face with patient and family in home. Palliative Care was asked to follow this patient by consultation request of  Sparks, [name], MD to address advance care planning and complex medical decision making. This is a follow up visit.                                  ?ASSESSMENT AND PLAN / RECOMMENDATIONS:  ?Symptom Management/Plan: ?1. Advance Care Planning; DNR ?  ?2. Palliative care encounter; Palliative care encounter; Palliative medicine team will continue to support patient, patient's family, and medical team. Visit consisted of counseling and education dealing with the complex and emotionally intense issues of symptom management and palliative care in the setting of serious and potentially life-threatening illness ?  ?3. Debility secondary to dementia,  progressive decline; Continue to encourage mobility, monitor fall risk. ?  ?4. Memory loss worsening; secondary to dementia, progressive, Continue supportive role, monitoring for further decline.  ? ?Follow up Palliative Care Visit: Palliative care will continue to follow for complex medical decision making, advance care planning, and clarification of goals. Return 8 weeks or prn. ? ?I spent 61 minutes providing this consultation. More than 50% of the time in this consultation was spent in counseling  and care coordination. ?PPS: 50% ? ?HOSPICE ELIGIBILITY/DIAGNOSIS: TBD ? ?Chief Complaint: Follow up palliative consult for complex medical decision making ? ?HISTORY OF PRESENT ILLNESS:  Leroy [name] is a 88 y.o. year old male  with multiple medical problems including  Dementia,  intraventricular conduction defect, Stroke, peripheral vascular disease, obesity, hypertension, hyperlipidemia, chronic kidney disease, anemia, skin cancer, Tremor, vitamin D deficiency, vitamin B12 deficiency, hiatal hernia, history of gastric ulcer, lumbar laminectomy with decompression microdiscectomy, eye surgery. I called [name], Leroy [name] daughter to confirm f/u pc visit, with [name] in agreement, covid screening negative. I visited Leroy [name] with Mrs [name] and daughter [name] in Leroy [name] home. Leroy [name] is sitting in the chair in his chair. We talked about how he has been feeling, ros,symptoms including feeling dizzy with workup at ENT, We talked about his daily activities. We talked about ambulating with walker, ambulating in the yard. We talked about near falls. We talked fall precautions. We talked about quality of life, Leroy. [name] endorses he likes basketball. We talked about overall functional and cognitive decline, worsening memory. We talked about medical goals. We talked about Leroy [name] grandchildren. We talked about appetite, oral hydration, weight loss with supplement. We talked about importance of hydration, Leroy [name] endorses he drinks pepsi which is what he likes. We talked about importance of water. We talked about sleep patterns. We talked about chronic disease progression of dementia. We talked about and reviewed blood pressures, medications. We talked about role pc in poc. [name] and I talked  separately from Leroy [name] with overall decline, continue current plan. Therapeutic listening, emotional support provided. Questions answered. ? ?History obtained from review of EMR, discussion with Leroy [name],  daughter, [name] ?I reviewed available labs, medications, imaging, studies and related documents from the EMR.  Records reviewed and summarized above.  ? ?ROS ?10 point system reviewed with Leroy and Mrs Keidel, daughter, [name] ? ?Physical Exam: ?Constitutional: NAD ?General: frail appearing, pleasant male ?EYES: lids intact ?ENMT: oral mucous membranes moist ?CV: S1S2, RRR ?Pulmonary: LCTA, no increased work of breathing, no cough, room air ?Abdomen: normo-active BS + 4 quadrants, soft and non tender ?MSK: ambulatory with walker  ?Skin: warm and dry ?Neuro:  + generalized weakness,  + cognitive impairment ?Psych: non-anxious affect, A and O x 2 ?Thank you for the opportunity to participate in the care of Leroy. [name].  The palliative care team will continue to follow. Please call our office at [phone] if we can be of additional assistance.  ? ?Betty Brooks Z Dwana Garin, NP  ? ?COVID-19 PATIENT SCREENING TOOL ?Asked and negative response unless otherwise noted:  ? ?Have you had symptoms of covid, tested positive or been in contact with someone with symptoms/positive test in the past 5-10 days?  NO ?

## 2022-02-24 ENCOUNTER — Other Ambulatory Visit: Payer: Self-pay | Admitting: Internal Medicine

## 2022-02-24 DIAGNOSIS — R4182 Altered mental status, unspecified: Secondary | ICD-10-CM

## 2022-03-05 ENCOUNTER — Ambulatory Visit
Admission: RE | Admit: 2022-03-05 | Discharge: 2022-03-05 | Disposition: A | Discharge status: Home / Self Care | Payer: Medicare Other | Source: Ambulatory Visit | Attending: Internal Medicine | Admitting: Internal Medicine

## 2022-03-05 DIAGNOSIS — R4182 Altered mental status, unspecified: Secondary | ICD-10-CM | POA: Insufficient documentation

## 2022-04-01 ENCOUNTER — Encounter: Payer: Self-pay | Admitting: Nurse Practitioner

## 2022-04-01 ENCOUNTER — Other Ambulatory Visit: Payer: Medicare Other | Admitting: Nurse Practitioner

## 2022-04-01 DIAGNOSIS — F03911 Unspecified dementia, unspecified severity, with agitation: Secondary | ICD-10-CM

## 2022-04-01 DIAGNOSIS — R5381 Other malaise: Secondary | ICD-10-CM

## 2022-04-01 DIAGNOSIS — Z515 Encounter for palliative care: Secondary | ICD-10-CM

## 2022-04-01 NOTE — Progress Notes (Signed)
[facility] Pall[facility] Consult Note Telephone: [phone]  Fax: [phone]    Date of encounter: 04/01/22 5:11 PM PATIENT NAME: Leroy [name] [address] [place] [address]   [phone] (home)  DOB: [birth year] MRN: [id number] PRIMARY CARE PROVIDER:    Idelle Crouch, MD,  [address] [place] [address] [phone]  RESPONSIBLE PARTY:    Contact Information     Name Relation Home Work Mobile   [place]wood Spouse [phone]     Leola Brazil Daughter [phone]  [phone]   Sena Hitch Daughter   [phone]      I met face to face with patient and family in home. Palliative Care was asked to follow this patient by consultation request of  Sparks, [name], MD to address advance care planning and complex medical decision making. This is a follow up visit.                                  ASSESSMENT AND PLAN / RECOMMENDATIONS:  Symptom Management/Plan: 1. Advance Care Planning; DNR   2. Palliative care encounter; Palliative care encounter; Palliative medicine team will continue to support patient, patient's family, and medical team. Visit consisted of counseling and education dealing with the complex and emotionally intense issues of symptom management and palliative care in the setting of serious and potentially life-threatening illness   3. Debility secondary to dementia,  progressive decline; Continue to encourage mobility, monitor fall risk.   4. Agitation secondary to dementia, progressive, Continue supportive role, monitoring for further decline. We talked about each medication. Dr Doy Hutching ordered Haldol. We talked about purpose and when to use. We talked about the severity of the behaviors, maybe able to try low dose melatonin 1 to 3 mg. Discussed can also give at bedtime at a higher dose 3 to 56m qhs though he does sleep well at night. We talked about the idea  behind giving a dose of melatonin which may help to calm Leroy BSpellman We talked about episode of ?hallucination when he awoke from sleep thinking someone was breaking into his home. Leroy Spillersendorses he has been more "paranoid". He misplaces things and believes someone takes his stuff. Discussed he may benefit if continues from seroquel or olazapine low dose. Will continue to monitor   Follow up Palliative Care Visit: Palliative care will continue to follow for complex medical decision making, advance care planning, and clarification of goals. Return 8 weeks or prn.   I spent 61 minutes providing this consultation. More than 50% of the time in this consultation was spent in counseling and care coordination. PPS: 50% Chief Complaint: Follow up palliative consult for complex medical decision making   HISTORY OF PRESENT ILLNESS:  Leroy RALLISis a 88y.o. year old male  with multiple medical problems including  Dementia,  intraventricular conduction defect, Stroke, peripheral vascular disease, obesity, hypertension, hyperlipidemia, chronic kidney disease, anemia, skin cancer, Tremor, vitamin D deficiency, vitamin B12 deficiency, hiatal hernia, history of gastric ulcer, lumbar laminectomy with decompression microdiscectomy, eye surgery. I called Leroy [name], Leroy BStoutenburgdaughter to confirm f/u pc visit, with Leroy [name] in agreement, covid screening negative. I visited Leroy BChisolmwith Leroy BAlfieriand daughter Leroy [name] Leroy BMountain Viewhome. Leroy BMittmanis sitting in the chair in his chair. We talked about recent UTI, MRI, reviewed labs including creatinine, sodium. We talked about falls with no recent.  We talked about dizziness with ENT visit. We talked about ros, symptoms, We talked about ambulating with walker. We talked about medical goals. We talked about increase in verbal behaviors per Leroy [name], Leroy [name]. Leroy [name] repeated several times during the [facility] visit, this was not the man she married. Leroy [name] denies any concerns with Leroy  Tootle being physical with her, we talked about safety. We talked about agitation, irritability, verbally. We talked about medications, Leroy [name] had printed off a list from a Alzheimer dementia web site. We talked about each medication. Dr Doy Hutching ordered Haldol. We talked about purpose and when to use. We talked about the severity of the behaviors, maybe able to try low dose melatonin 1 to 3 mg. Discussed can also give at bedtime at a higher dose 3 to 20m qhs though he does sleep well at night. We talked about the idea behind giving a dose of melatonin which may help to calm Leroy [name] We talked about episode of ?hallucination when he awoke from sleep thinking someone was breaking into his home. Leroy Spillersendorses he has been more "paranoid". He misplaces things and believes someone takes his stuff. Discussed he may benefit if continues from seroquel or olazapine low dose. We talked about Leroy BHockettendorses he sleeps well at night, even naps during the morning after he gets up in the morning. Leroy. BGeisentalked about his daily routine during the day. We talked about safety caution with basement stairs, encouraged Leroy. BWitmanto walk around so he is not trying to go down steep stairs. We talked about concerns about Leroy BSellinstaying by himself for periods of time, recommended in home caregivers or family to stay when Leroy Spillersneeds to work or run an errands. Leroy [name] endorses she will work with Leroy Spillersto come up with a schedule. We talked about disease progression of dementia with expectations. We talked about f/u pc visit, scheduled. Will have PC RN make a visit in 4 weeks then NP 8 weeks. Leroy [name] in agreement. Therapeutic listening, emotional support provided Questions answered.  Therapeutic listening, emotional support provided. Questions answered.   History obtained from review of EMR, discussion with Leroy and Leroy [name] daughter, Leroy [name] reviewed available labs, medications, imaging, studies and related documents from the  EMR.  Records reviewed and summarized above.    ROS 10 point system reviewed with Leroy and Leroy BDray daughter, A66  Physical Exam: Constitutional: NAD General: frail appearing, pleasant male EYES: lids intact ENMT: oral mucous membranes moist CV: S1S2, RRR Pulmonary: LCTA, no increased work of breathing, no cough, room air Abdomen: normo-active BS + 4 quadrants, soft and non tender MSK: ambulatory with walker  Skin: warm and dry Neuro:  + generalized weakness,  + cognitive impairment Psych: non-anxious affect, A and O x 2 Thank you for the opportunity to participate in the care of Leroy. BHickel  The palliative care team will continue to follow. Please call our office at [phone] we can be of additional assistance.   Dalayla Aldredge Z Dinorah Masullo, NP   COVID-19 PATIENT SCREENING TOOL Asked and negative response unless otherwise noted:   Have you had symptoms of covid, tested positive or been in contact with someone with symptoms/positive test in the past 5-10 days? no

## 2022-04-30 ENCOUNTER — Telehealth: Payer: Self-pay

## 2022-04-30 NOTE — Telephone Encounter (Signed)
10 am.  Follow up call made to wife to check on patient status.  No answer.  Message left requesting a call back.

## 2022-05-06 ENCOUNTER — Other Ambulatory Visit: Payer: Medicare Other

## 2022-05-06 DIAGNOSIS — Z515 Encounter for palliative care: Secondary | ICD-10-CM

## 2022-05-06 NOTE — Progress Notes (Signed)
PATIENT NAME: Leroy [name] DOB: 1930-01-28 MRN: [id number]  PRIMARY CARE PROVIDER: Idelle Crouch, MD  RESPONSIBLE PARTY:  Acct ID - Guarantor Home Phone Work Phone Relationship Acct Type  [id number] NIKOS, [phone]  Self P/F     [address] [place], [place], [place] [address]     Follow up call made to wife [name] at the request of Christin Gusler, NP.  Per wife, patient is no longer having behavioral issues. No medication changes have been made. Patient is sleeping well but is awaking during incontinence episodes.  [name] feels this is occurring more often now.   Continues to have some spells of dizziness upon first getting up.  No recent falls.  Appetite remains good.  Patient is going out to water plants that his daughter planted for him otherwise patient is remaining in the living room for most days.  Wife does not have any new concerns at this time.  Aware of follow up visit with NP next month.      Lorenza Burton, RN

## 2022-06-10 ENCOUNTER — Encounter: Payer: Self-pay | Admitting: Nurse Practitioner

## 2022-06-10 ENCOUNTER — Other Ambulatory Visit: Payer: Medicare Other | Admitting: Nurse Practitioner

## 2022-06-10 DIAGNOSIS — Z515 Encounter for palliative care: Secondary | ICD-10-CM

## 2022-06-10 DIAGNOSIS — F02818 Dementia in other diseases classified elsewhere, unspecified severity, with other behavioral disturbance: Secondary | ICD-10-CM

## 2022-06-10 DIAGNOSIS — R5381 Other malaise: Secondary | ICD-10-CM

## 2022-06-10 NOTE — Progress Notes (Signed)
[facility] Palliative Care Consult Note Telephone: [phone]  Fax: [phone]    Date of encounter: 06/10/22 6:59 PM PATIENT NAME: Leroy [name] [address] [place] [address]   [phone] (home)  DOB: 11/07/1929 MRN: [id number] PRIMARY CARE PROVIDER:    Idelle Crouch, MD,  [address] [place] [address] [phone]  REFERRING PROVIDER:   Idelle Crouch, MD [address] [facility] [place],  [place] [address] [phone]  RESPONSIBLE PARTY:    Contact Information     Name Relation Home Work Mobile   [place] Spouse [phone]     Leola Brazil Daughter [phone]  [phone]   Sena Hitch Daughter   [phone]     I met face to face with patient and family in home. Palliative Care was asked to follow this patient by consultation request of  Sparks, [name], MD to address advance care planning and complex medical decision making. This is a follow up visit.                                  ASSESSMENT AND PLAN / RECOMMENDATIONS:  Symptom Management/Plan: 1. Advance Care Planning; DNR   2. Palliative care encounter; Palliative care encounter; Palliative medicine team will continue to support patient, patient's family, and medical team. Visit consisted of counseling and education dealing with the complex and emotionally intense issues of symptom management and palliative care in the setting of serious and potentially life-threatening illness   3. Debility secondary to dementia,  progressive decline; Continue to encourage mobility, monitor fall risk.  4. Agitation secondary to dementia, improved, discussed coping strategies, redirectable with frustration or resistance, continue supportive support   Follow up Palliative Care Visit: Palliative care will continue to follow for complex medical decision making, advance care planning, and clarification  of goals. Return 8 weeks or prn.   I spent 63 minutes providing this consultation. More than 50% of the time in this consultation was spent in counseling and care coordination. PPS: 50% Chief Complaint: Follow up palliative consult for complex medical decision making   HISTORY OF PRESENT ILLNESS:  Leroy [name] is a 88 y.o. year old male  with multiple medical problems including  Dementia,  intraventricular conduction defect, Stroke, peripheral vascular disease, obesity, hypertension, hyperlipidemia, chronic kidney disease, anemia, skin cancer, Tremor, vitamin D deficiency, vitamin B12 deficiency, hiatal hernia, history of gastric ulcer, lumbar laminectomy with decompression microdiscectomy, eye surgery. I called [name], Mr Kreuser daughter to confirm f/u pc visit, with [name] in agreement, covid screening negative. I visited Mr Nile with Mrs Harbaugh and daughter [name] in Mr [place]on home with Kateri Plummer NP student. Mr Wirt is sitting in the chair in his chair. We talked about ros, functional and cognitive changes.  We talked about disease progression of dementia with expectations. We talked about appetite. Nutrition, drinking a milk shake nightly. We talked about daily routine, quality of life that brings him joy which is riding his lawnmower with supervision. [name] endorses overall decline cognitively with some days better than other days. We talked about f/u pc visit, scheduled. [name] in agreement. Therapeutic listening, emotional support provided Questions answered.  Therapeutic listening, emotional support provided. Questions answered.   History obtained from review of EMR, discussion with Mr and Mrs Troublefield, daughter, [name] I reviewed available labs, medications, imaging, studies and related documents from the EMR.  Records reviewed and summarized above.    ROS 10 point system reviewed with Mr and Mrs Hawthorne, daughter, 88   Physical Exam: Constitutional: NAD General: frail  appearing, pleasant male EYES: lids intact ENMT: oral mucous membranes moist CV: S1S2, RRR Pulmonary: LCTA, no increased work of breathing, no cough, room air MSK: ambulatory with walker  Skin: warm and dry Neuro:  + generalized weakness,  + cognitive impairment Psych: non-anxious affect, A and O x 2  Thank you for the opportunity to participate in the care of Mr. Bones.  The palliative care team will continue to follow. Please call our office at [phone] if we can be of additional assistance.   Khaliya Golinski Ihor Gully, NP

## 2022-07-15 ENCOUNTER — Other Ambulatory Visit: Payer: Self-pay | Admitting: Urology

## 2022-07-20 ENCOUNTER — Other Ambulatory Visit: Payer: Self-pay | Admitting: Urology

## 2022-07-20 NOTE — Telephone Encounter (Signed)
Patient's daughter, [name] [phone]), called about the status of the refill request for Sildosin to be sent to [facility] in [place].

## 2022-08-12 ENCOUNTER — Telehealth: Payer: Self-pay

## 2022-08-12 NOTE — Telephone Encounter (Signed)
Encounter notes from [facility] scanned in under the media tab for your review.  

## 2022-08-18 NOTE — Telephone Encounter (Signed)
Reviewed. VM

## 2022-08-24 NOTE — Progress Notes (Signed)
Oncology Nurse Navigator Documentation   Placed introductory call to new referral patient Leroy [name]. I spoke with his daughter, [name].  Introduced myself as the H&N oncology nurse navigator that works with Dr. Isidore Moos to whom he has been referred by Dr. Winifred Olive. She confirmed understanding of referral. Briefly explained my role as his navigator, provided my contact information.  Confirmed understanding of upcoming appts and [facility] location, explained arrival and registration process. I encouraged her to call with questions/concerns as he moves forward with appts and procedures.   She verbalized understanding of information provided, expressed appreciation for my call.   Harlow Asa RN, BSN, OCN Head & Neck Oncology Nurse [facility] Phone # [phone]  Fax # [phone]

## 2022-08-24 NOTE — Progress Notes (Signed)
Radiation Oncology         [phone] ________________________________  Initial Outpatient Consultation  Name: Leroy [name] MRN: [id number]  Date: 08/25/2022  DOB: [birth year]  [name], Leroy Douglas, MD  Karin Golden, MD   REFERRING PHYSICIAN: Karin Golden, MD  DIAGNOSIS:    ICD-10-CM   1. 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 male who presented to Dr. Anabel Bene at [facility] dermatology on 07/30/22 for evaluation of a suspicious lesion on his left forehead. Per encounter notes, the lesion had enlarged slowly over an unknown course of time. The patient also has a previous history of squamous cell carcinoma prior to his present diagnosis.    Biopsy of the left forehead lesion collected by Dr. Anabel Bene on 07/30/22 showed invasive squamous cell carcinoma with features of keratoacanthoma extending to the deep margin.   The patient was accordingly referred to Dr. Winifred Olive and proceeded with Mohs of the left forehead lesion on 08/12/22. Pathology showed residual squamous cell carcinoma with PNI involving at least one nerve (measuring 0.1 mm in diameter).    Past skin cancers, if any: Squamous cell carcinoma right cheek (Mohs surgery 11/27/2021) 2) Location/Histology/Intervention:  3) Location/Histology/Intervention:   History of Blistering sunburns, if any: no  SAFETY ISSUES: Prior radiation? no Pacemaker/ICD? no Possible current pregnancy? N/A Is the patient on methotrexate? no  He has cognitive  impairment at baseline and is often accompanied by his supportive wife and daughter  - they are here today.  PHOTOS FROM DR. MITKOV (MOHS)        PREVIOUS RADIATION THERAPY: No  PAST MEDICAL HISTORY:  has a past medical history of Anemia, Bursitis, Cancer ([place]), Chronic kidney disease, Dementia ([place]), Dyspnea, GI bleed, History of hiatal hernia, Hyperlipidemia, Hypertension, IVCD (intraventricular conduction defect), Leg weakness, bilateral, Obesity, Peripheral vascular disease ([place]), SCC (squamous cell carcinoma) (11/20/2021), Stomach ulcer, Stroke ([place]), Tremor, Vitamin B 12 deficiency, and Vitamin D deficiency.    PAST SURGICAL HISTORY: Past Surgical History:  Procedure Laterality Date   BACK SURGERY     BIOPSY  03/05/2021   Procedure: BIOPSY;  Surgeon: Rush Landmark, Telford Nab., MD;  Location: [place];  Service: Gastroenterology;;   CHOLECYSTECTOMY N/A 03/03/2021   Procedure: LAPAROSCOPIC CHOLECYSTECTOMY WITH INTRAOPERATIVE CHOLANGIOGRAM;  Surgeon: Jesusita Oka, MD;  Location: [place];  Service: General;  Laterality: N/A;   ENDOSCOPIC RETROGRADE CHOLANGIOPANCREATOGRAPHY (ERCP) WITH PROPOFOL N/A 03/05/2021   Procedure: ENDOSCOPIC RETROGRADE CHOLANGIOPANCREATOGRAPHY (ERCP) WITH PROPOFOL;  Surgeon: Irving Copas., MD;  Location: [place];  Service: Gastroenterology;  Laterality: N/A;   ESOPHAGOGASTRODUODENOSCOPY (EGD) WITH PROPOFOL N/A 11/09/2018   Procedure: ESOPHAGOGASTRODUODENOSCOPY (EGD) WITH PROPOFOL;  Surgeon: Manya Silvas, MD;  Location: [facility] ENDOSCOPY;  Service: Endoscopy;  Laterality: N/A;   EYE SURGERY      bilateral cataracts   HERNIA REPAIR     inguinal on left   INGUINAL HERNIA REPAIR Right 12/29/2017   Procedure: HERNIA REPAIR INGUINAL ADULT;  Surgeon: Herbert Pun, MD;  Location: [facility] ORS;  Service: General;  Laterality: Right;  INSERTION OF MESH Right 12/29/2017   Procedure: INSERTION OF MESH;  Surgeon: Herbert Pun, MD;  Location:  [facility]S;  Service: General;  Laterality: Right;   LUMBAR LAMINECTOMY/DECOMPRESSION MICRODISCECTOMY N/A 08/12/2016   Procedure: LAMINECTOMY AND FORAMINOTOMY LUMBAR FOUR - LUMBAR FIVE;  Surgeon: Newman Pies, MD;  Location: [place];  Service: Neurosurgery;  Laterality: N/A;  LAMINECTOMY AND FORAMINOTOMY L4-L5   REMOVAL OF STONES  03/05/2021   Procedure: REMOVAL OF STONES;  Surgeon: Rush Landmark Telford Nab., MD;  Location: [place];  Service: Gastroenterology;;   [name]  03/05/2021   Procedure: [name];  Surgeon: Mansouraty, Telford Nab., MD;  Location: [facility] ENDOSCOPY;  Service: Gastroenterology;;    FAMILY HISTORY: family history includes CAD in his mother; Cancer in his father.  SOCIAL HISTORY:  reports that he has never smoked. His smokeless tobacco use includes chew. He reports that he does not drink alcohol and does not use drugs.  ALLERGIES: Aspirin  MEDICATIONS:  Current Outpatient Medications  Medication Sig Dispense Refill   cholecalciferol (VITAMIN D) 1000 units tablet Take 2,000 Units by mouth daily.      clopidogrel (PLAVIX) 75 MG tablet Take 1 tablet (75 mg total) by mouth daily.     cyanocobalamin (,VITAMIN B-12,) 1000 MCG/ML injection Inject 1,000 mcg into the muscle every 30 (thirty) days.     Docusate Sodium (DSS) 100 MG CAPS Take by mouth.     ezetimibe (ZETIA) 10 MG tablet Take 10 mg by mouth at bedtime.     isosorbide mononitrate (IMDUR) 30 MG 24 hr tablet Take by mouth.     levothyroxine (SYNTHROID) 50 MCG tablet Take 50 mcg by mouth daily.     pantoprazole (PROTONIX) 40 MG tablet Take by mouth.     silodosin (RAPAFLO) 4 MG CAPS capsule TAKE 1 CAPSULE BY MOUTH ONCE DAILY WITH BREAKFAST 30 capsule 3   metoprolol succinate (TOPROL-XL) 25 MG 24 hr tablet Take 12.5 mg by mouth in the morning.      mupirocin ointment (BACTROBAN) 2 % Apply 1 application topically as directed. Qd to bid to aa face (Patient not taking: Reported on 08/25/2022) 22 g 0   pantoprazole  (PROTONIX) 40 MG tablet Take 1 tablet (40 mg total) by mouth 2 (two) times daily before a meal for 30 days, THEN 1 tablet (40 mg total) daily. 90 tablet 2   sulfamethoxazole-trimethoprim (BACTRIM DS) 800-160 MG tablet Take 1 tablet by mouth 2 (two) times daily for 5 days. 10 tablet 1   No current facility-administered medications for this encounter.    REVIEW OF SYSTEMS:  Notable for that above.   PHYSICAL EXAM:  height is '6\' 1"'$  (1.854 m) and weight is 218 lb 3.2 oz (99 kg). His temperature is 97.7 F (36.5 C). His blood pressure is 141/75 (abnormal) and his pulse is 64. His respiration is 20 and oxygen saturation is 99%.   General: Alert, in no acute distress  HEENT: left forehead scar healing satisfactorily, no sign of local recurrence; left brow/left eye lid droop r/t post op changes;no facial adenopathy Neck: Neck is supple, no palpable cervical or supraclavicular lymphadenopathy. Musculoskeletal: ambulatory Neurologic:  evidence of cognitive impairment Psychiatric: pleasant to speak with, alert.  ECOG = 2  0 - Asymptomatic (Fully active, able to carry on all predisease activities without restriction)  1 - Symptomatic but completely ambulatory (Restricted in physically strenuous activity but ambulatory and able to carry out work of a light or sedentary nature. For example, light housework, office work)  2 - Symptomatic, <50% in bed during the day (Ambulatory and capable of all self care but unable to carry out any work activities. Up and about more than 50% of waking hours)  3 - Symptomatic, >50% in bed, but not bedbound (Capable of only limited self-care, confined to bed or chair 50% or more of waking hours)  4 - Bedbound (Completely disabled. Cannot carry on any self-care. Totally confined to bed or chair)  5 - Death   Eustace Pen MM, Creech RH, Tormey DC, et al. [phone]). "Toxicity and response criteria of the [facility]". [place] Oncol. 5 (6):  649-55   LABORATORY DATA:  Lab Results  Component Value Date   WBC 7.4 11/20/2021   HGB 11.6 (L) 11/20/2021   HCT 34.4 (L) 11/20/2021   MCV 89 11/20/2021   PLT 279 11/20/2021   CMP     Component Value Date/Time   NA 142 11/20/2021 1400   K 4.9 11/20/2021 1400   CL 106 11/20/2021 1400   CO2 22 11/20/2021 1400   GLUCOSE 88 11/20/2021 1400   GLUCOSE 107 (H) 03/06/2021 0307   BUN 31 (H) 11/20/2021 1400   CREATININE 1.70 (H) 11/20/2021 1400   CALCIUM 9.2 11/20/2021 1400   PROT 6.2 (L) 03/06/2021 0307   ALBUMIN 2.7 (L) 03/06/2021 0307   AST 155 (H) 03/06/2021 0307   ALT 201 (H) 03/06/2021 0307   ALKPHOS 127 (H) 03/06/2021 0307   BILITOT 1.6 (H) 03/06/2021 0307   GFRNONAA 40 (L) 03/06/2021 0307   GFRAA 45 (L) 03/11/2020 0217       RADIOGRAPHY: No results found.    IMPRESSION/PLAN: Skin cancer, left forehead, significant PNI  Today, I talked to the patient and family about the findings and work-up thus far.  We discussed the patient's diagnosis of left forehead squamous cell carcinoma and general treatment for this, highlighting the role of radiotherapy in the management.  We discussed the available radiation techniques, and focused on the details of logistics and delivery.     I recommend 4 weeks of electron radiotherapy for local control.  We discussed the risks, benefits, and side effects of radiotherapy. Side effects may include but not necessarily be limited to: skin irritation, fatigue, skin peeling, hair loss, permanent injury to soft tissue or bone in the left forehead region, rare neural tissue injury.  No guarantees of treatment were given. A consent form was signed and placed in the patient's medical record. The patient was encouraged to ask questions that I answered to the best of my ability.    Will arrange CT simulation next week and start treatment about a week thereafter.  It was a pleasure to meet this wonderful man and his family.  On date of service, in  total, I spent 45 minutes on this encounter. Patient was seen in person.   __________________________________________   Eppie Gibson, MD  This document serves as a record of services personally performed by Eppie Gibson, MD. It was created on her behalf by Roney Mans, a trained medical scribe. The creation of this record is based on the scribe's personal observations and the provider's statements to them. This document has been checked and approved by the attending provider.

## 2022-08-25 ENCOUNTER — Encounter: Payer: Self-pay | Admitting: Radiation Oncology

## 2022-08-25 ENCOUNTER — Ambulatory Visit
Admission: RE | Admit: 2022-08-25 | Discharge: 2022-08-25 | Disposition: A | Discharge status: Home / Self Care | Payer: Medicare Other | Source: Ambulatory Visit | Attending: Radiation Oncology | Admitting: Radiation Oncology

## 2022-08-25 ENCOUNTER — Other Ambulatory Visit: Payer: Medicare Other | Admitting: Nurse Practitioner

## 2022-08-25 VITALS — BP 141/75 | HR 64 | Temp 97.7°F | Resp 20 | Ht 73.0 in | Wt 218.2 lb

## 2022-08-25 DIAGNOSIS — E669 Obesity, unspecified: Secondary | ICD-10-CM | POA: Diagnosis not present

## 2022-08-25 DIAGNOSIS — Z8711 Personal history of peptic ulcer disease: Secondary | ICD-10-CM | POA: Diagnosis not present

## 2022-08-25 DIAGNOSIS — D649 Anemia, unspecified: Secondary | ICD-10-CM | POA: Diagnosis not present

## 2022-08-25 DIAGNOSIS — E538 Deficiency of other specified B group vitamins: Secondary | ICD-10-CM | POA: Diagnosis not present

## 2022-08-25 DIAGNOSIS — F0390 Unspecified dementia without behavioral disturbance: Secondary | ICD-10-CM | POA: Diagnosis not present

## 2022-08-25 DIAGNOSIS — C44329 Squamous cell carcinoma of skin of other parts of face: Secondary | ICD-10-CM | POA: Insufficient documentation

## 2022-08-25 DIAGNOSIS — N189 Chronic kidney disease, unspecified: Secondary | ICD-10-CM | POA: Diagnosis not present

## 2022-08-25 DIAGNOSIS — I129 Hypertensive chronic kidney disease with stage 1 through stage 4 chronic kidney disease, or unspecified chronic kidney disease: Secondary | ICD-10-CM | POA: Diagnosis not present

## 2022-08-25 DIAGNOSIS — Z8673 Personal history of transient ischemic attack (TIA), and cerebral infarction without residual deficits: Secondary | ICD-10-CM | POA: Diagnosis not present

## 2022-08-25 DIAGNOSIS — K449 Diaphragmatic hernia without obstruction or gangrene: Secondary | ICD-10-CM | POA: Diagnosis not present

## 2022-08-25 DIAGNOSIS — E559 Vitamin D deficiency, unspecified: Secondary | ICD-10-CM | POA: Insufficient documentation

## 2022-08-25 DIAGNOSIS — E785 Hyperlipidemia, unspecified: Secondary | ICD-10-CM | POA: Insufficient documentation

## 2022-08-25 DIAGNOSIS — I739 Peripheral vascular disease, unspecified: Secondary | ICD-10-CM | POA: Insufficient documentation

## 2022-08-25 NOTE — Progress Notes (Signed)
Histology and Location of Primary Skin Cancer:    Leroy [name] (from Dr. Margit Banda 07/30/22 office note)    Past/Anticipated interventions by patient's surgeon/dermatologist for current problematic lesion, if any:  08/12/2022 --Dr. Karin Golden Mohs surgery to left forehead  07/30/2022 --Dr. Darrick Meigs  Shave biopsy of left forehead   Past skin cancers, if any: Squamous cell carcinoma right cheek (Mohs surgery 11/27/2021) 2) Location/Histology/Intervention:  3) Location/Histology/Intervention:   History of Blistering sunburns, if any: no  SAFETY ISSUES: Prior radiation? no Pacemaker/ICD? no Possible current pregnancy? N/A Is the patient on methotrexate? no  Current Complaints / other details:   Vitals:   08/25/22 1042  BP: (!) 141/75  Pulse: 64  Resp: 20  Temp: 97.7 F (36.5 C)  SpO2: 99%  Weight: 99 kg  Height: '6\' 1"'$  (1.854 m)

## 2022-08-31 ENCOUNTER — Other Ambulatory Visit: Payer: Self-pay

## 2022-08-31 ENCOUNTER — Ambulatory Visit
Admission: RE | Admit: 2022-08-31 | Discharge: 2022-08-31 | Disposition: A | Discharge status: Home / Self Care | Payer: Medicare Other | Source: Ambulatory Visit | Attending: Radiation Oncology | Admitting: Radiation Oncology

## 2022-08-31 ENCOUNTER — Encounter (INDEPENDENT_AMBULATORY_CARE_PROVIDER_SITE_OTHER): Payer: Self-pay

## 2022-08-31 DIAGNOSIS — C44329 Squamous cell carcinoma of skin of other parts of face: Secondary | ICD-10-CM | POA: Insufficient documentation

## 2022-09-01 ENCOUNTER — Telehealth: Payer: Self-pay

## 2022-09-01 NOTE — Telephone Encounter (Signed)
515 pm .  Return call made to daughter [name].  Discussed follow up visit with Palliative Care and changes that are occurring within the program.  She would like to defer home visits for now as patient is receiving radiation treatments.  Palliative Care will follow up with a phone call next month to check in on patient status and any needs.

## 2022-09-08 DIAGNOSIS — Z510 Encounter for antineoplastic radiation therapy: Secondary | ICD-10-CM | POA: Insufficient documentation

## 2022-09-08 DIAGNOSIS — C44329 Squamous cell carcinoma of skin of other parts of face: Secondary | ICD-10-CM | POA: Diagnosis not present

## 2022-09-09 ENCOUNTER — Other Ambulatory Visit: Payer: Medicare Other | Admitting: Nurse Practitioner

## 2022-09-09 ENCOUNTER — Ambulatory Visit
Admission: RE | Admit: 2022-09-09 | Discharge: 2022-09-09 | Disposition: A | Discharge status: Home / Self Care | Payer: Medicare Other | Source: Ambulatory Visit | Attending: Radiation Oncology | Admitting: Radiation Oncology

## 2022-09-09 ENCOUNTER — Other Ambulatory Visit: Payer: Self-pay

## 2022-09-09 DIAGNOSIS — C44329 Squamous cell carcinoma of skin of other parts of face: Secondary | ICD-10-CM | POA: Diagnosis not present

## 2022-09-09 LAB — RAD ONC ARIA SESSION SUMMARY
Course Elapsed Days: 0
Plan Fractions Treated to Date: 1
Plan Prescribed Dose Per Fraction: 2.5 Gy
Plan Total Fractions Prescribed: 20
Plan Total Prescribed Dose: 50 Gy
Reference Point Dosage Given to Date: 2.5 Gy
Reference Point Session Dosage Given: 2.5 Gy
Session Number: 1

## 2022-09-10 ENCOUNTER — Other Ambulatory Visit: Payer: Self-pay

## 2022-09-10 ENCOUNTER — Ambulatory Visit
Admission: RE | Admit: 2022-09-10 | Discharge: 2022-09-10 | Disposition: A | Discharge status: Home / Self Care | Payer: Medicare Other | Source: Ambulatory Visit | Attending: Radiation Oncology | Admitting: Radiation Oncology

## 2022-09-10 DIAGNOSIS — C44329 Squamous cell carcinoma of skin of other parts of face: Secondary | ICD-10-CM | POA: Diagnosis not present

## 2022-09-10 LAB — RAD ONC ARIA SESSION SUMMARY
Course Elapsed Days: 1
Plan Fractions Treated to Date: 2
Plan Prescribed Dose Per Fraction: 2.5 Gy
Plan Total Fractions Prescribed: 20
Plan Total Prescribed Dose: 50 Gy
Reference Point Dosage Given to Date: 5 Gy
Reference Point Session Dosage Given: 2.5 Gy
Session Number: 2

## 2022-09-11 ENCOUNTER — Other Ambulatory Visit: Payer: Self-pay

## 2022-09-11 ENCOUNTER — Ambulatory Visit
Admission: RE | Admit: 2022-09-11 | Discharge: 2022-09-11 | Disposition: A | Discharge status: Home / Self Care | Payer: Medicare Other | Source: Ambulatory Visit | Attending: Radiation Oncology | Admitting: Radiation Oncology

## 2022-09-11 DIAGNOSIS — C44329 Squamous cell carcinoma of skin of other parts of face: Secondary | ICD-10-CM | POA: Diagnosis not present

## 2022-09-11 LAB — RAD ONC ARIA SESSION SUMMARY
Course Elapsed Days: 2
Plan Fractions Treated to Date: 3
Plan Prescribed Dose Per Fraction: 2.5 Gy
Plan Total Fractions Prescribed: 20
Plan Total Prescribed Dose: 50 Gy
Reference Point Dosage Given to Date: 7.5 Gy
Reference Point Session Dosage Given: 2.5 Gy
Session Number: 3

## 2022-09-14 ENCOUNTER — Other Ambulatory Visit: Payer: Self-pay

## 2022-09-14 ENCOUNTER — Ambulatory Visit
Admission: RE | Admit: 2022-09-14 | Discharge: 2022-09-14 | Disposition: A | Discharge status: Home / Self Care | Payer: Medicare Other | Source: Ambulatory Visit | Attending: Radiation Oncology | Admitting: Radiation Oncology

## 2022-09-14 ENCOUNTER — Ambulatory Visit: Payer: Medicare Other

## 2022-09-14 DIAGNOSIS — C44329 Squamous cell carcinoma of skin of other parts of face: Secondary | ICD-10-CM

## 2022-09-14 LAB — RAD ONC ARIA SESSION SUMMARY
Course Elapsed Days: 5
Plan Fractions Treated to Date: 4
Plan Prescribed Dose Per Fraction: 2.5 Gy
Plan Total Fractions Prescribed: 20
Plan Total Prescribed Dose: 50 Gy
Reference Point Dosage Given to Date: 10 Gy
Reference Point Session Dosage Given: 2.5 Gy
Session Number: 4

## 2022-09-14 MED ORDER — SONAFINE EX EMUL
1.0000 | Freq: Two times a day (BID) | CUTANEOUS | Status: DC
  Administered 2022-09-14: 1 via TOPICAL

## 2022-09-15 ENCOUNTER — Ambulatory Visit
Admission: RE | Admit: 2022-09-15 | Discharge: 2022-09-15 | Disposition: A | Discharge status: Home / Self Care | Payer: Medicare Other | Source: Ambulatory Visit | Attending: Radiation Oncology | Admitting: Radiation Oncology

## 2022-09-15 ENCOUNTER — Other Ambulatory Visit: Payer: Self-pay

## 2022-09-15 DIAGNOSIS — C44329 Squamous cell carcinoma of skin of other parts of face: Secondary | ICD-10-CM | POA: Diagnosis not present

## 2022-09-15 LAB — RAD ONC ARIA SESSION SUMMARY
Course Elapsed Days: 6
Plan Fractions Treated to Date: 5
Plan Prescribed Dose Per Fraction: 2.5 Gy
Plan Total Fractions Prescribed: 20
Plan Total Prescribed Dose: 50 Gy
Reference Point Dosage Given to Date: 12.5 Gy
Reference Point Session Dosage Given: 2.5 Gy
Session Number: 5

## 2022-09-16 ENCOUNTER — Other Ambulatory Visit: Payer: Self-pay

## 2022-09-16 ENCOUNTER — Ambulatory Visit
Admission: RE | Admit: 2022-09-16 | Discharge: 2022-09-16 | Disposition: A | Discharge status: Home / Self Care | Payer: Medicare Other | Source: Ambulatory Visit | Attending: Radiation Oncology | Admitting: Radiation Oncology

## 2022-09-16 DIAGNOSIS — C44329 Squamous cell carcinoma of skin of other parts of face: Secondary | ICD-10-CM | POA: Diagnosis not present

## 2022-09-16 LAB — RAD ONC ARIA SESSION SUMMARY
Course Elapsed Days: 7
Plan Fractions Treated to Date: 6
Plan Prescribed Dose Per Fraction: 2.5 Gy
Plan Total Fractions Prescribed: 20
Plan Total Prescribed Dose: 50 Gy
Reference Point Dosage Given to Date: 15 Gy
Reference Point Session Dosage Given: 2.5 Gy
Session Number: 6

## 2022-09-17 ENCOUNTER — Other Ambulatory Visit: Payer: Self-pay

## 2022-09-17 ENCOUNTER — Ambulatory Visit
Admission: RE | Admit: 2022-09-17 | Discharge: 2022-09-17 | Disposition: A | Discharge status: Home / Self Care | Payer: Medicare Other | Source: Ambulatory Visit | Attending: Radiation Oncology | Admitting: Radiation Oncology

## 2022-09-17 DIAGNOSIS — C44329 Squamous cell carcinoma of skin of other parts of face: Secondary | ICD-10-CM | POA: Diagnosis not present

## 2022-09-17 LAB — RAD ONC ARIA SESSION SUMMARY
Course Elapsed Days: 8
Plan Fractions Treated to Date: 7
Plan Prescribed Dose Per Fraction: 2.5 Gy
Plan Total Fractions Prescribed: 20
Plan Total Prescribed Dose: 50 Gy
Reference Point Dosage Given to Date: 17.5 Gy
Reference Point Session Dosage Given: 2.5 Gy
Session Number: 7

## 2022-09-18 ENCOUNTER — Other Ambulatory Visit: Payer: Self-pay

## 2022-09-18 ENCOUNTER — Ambulatory Visit
Admission: RE | Admit: 2022-09-18 | Discharge: 2022-09-18 | Disposition: A | Discharge status: Home / Self Care | Payer: Medicare Other | Source: Ambulatory Visit | Attending: Radiation Oncology | Admitting: Radiation Oncology

## 2022-09-18 DIAGNOSIS — C44329 Squamous cell carcinoma of skin of other parts of face: Secondary | ICD-10-CM | POA: Diagnosis not present

## 2022-09-18 LAB — RAD ONC ARIA SESSION SUMMARY
Course Elapsed Days: 9
Plan Fractions Treated to Date: 8
Plan Prescribed Dose Per Fraction: 2.5 Gy
Plan Total Fractions Prescribed: 20
Plan Total Prescribed Dose: 50 Gy
Reference Point Dosage Given to Date: 20 Gy
Reference Point Session Dosage Given: 2.5 Gy
Session Number: 8

## 2022-09-21 ENCOUNTER — Ambulatory Visit: Payer: Medicare Other

## 2022-09-21 ENCOUNTER — Other Ambulatory Visit: Payer: Self-pay

## 2022-09-21 ENCOUNTER — Ambulatory Visit
Admission: RE | Admit: 2022-09-21 | Discharge: 2022-09-21 | Disposition: A | Discharge status: Home / Self Care | Payer: Medicare Other | Source: Ambulatory Visit | Attending: Radiation Oncology | Admitting: Radiation Oncology

## 2022-09-21 DIAGNOSIS — C44329 Squamous cell carcinoma of skin of other parts of face: Secondary | ICD-10-CM | POA: Diagnosis not present

## 2022-09-21 LAB — RAD ONC ARIA SESSION SUMMARY
Course Elapsed Days: 12
Plan Fractions Treated to Date: 9
Plan Prescribed Dose Per Fraction: 2.5 Gy
Plan Total Fractions Prescribed: 20
Plan Total Prescribed Dose: 50 Gy
Reference Point Dosage Given to Date: 22.5 Gy
Reference Point Session Dosage Given: 2.5 Gy
Session Number: 9

## 2022-09-22 ENCOUNTER — Other Ambulatory Visit: Payer: Self-pay

## 2022-09-22 ENCOUNTER — Ambulatory Visit
Admission: RE | Admit: 2022-09-22 | Discharge: 2022-09-22 | Disposition: A | Discharge status: Home / Self Care | Payer: Medicare Other | Source: Ambulatory Visit | Attending: Radiation Oncology | Admitting: Radiation Oncology

## 2022-09-22 DIAGNOSIS — C44329 Squamous cell carcinoma of skin of other parts of face: Secondary | ICD-10-CM | POA: Diagnosis not present

## 2022-09-22 LAB — RAD ONC ARIA SESSION SUMMARY
Course Elapsed Days: 13
Plan Fractions Treated to Date: 10
Plan Prescribed Dose Per Fraction: 2.5 Gy
Plan Total Fractions Prescribed: 20
Plan Total Prescribed Dose: 50 Gy
Reference Point Dosage Given to Date: 25 Gy
Reference Point Session Dosage Given: 2.5 Gy
Session Number: 10

## 2022-09-23 ENCOUNTER — Other Ambulatory Visit: Payer: Self-pay

## 2022-09-23 ENCOUNTER — Ambulatory Visit
Admission: RE | Admit: 2022-09-23 | Discharge: 2022-09-23 | Disposition: A | Discharge status: Home / Self Care | Payer: Medicare Other | Source: Ambulatory Visit | Attending: Radiation Oncology | Admitting: Radiation Oncology

## 2022-09-23 DIAGNOSIS — C44329 Squamous cell carcinoma of skin of other parts of face: Secondary | ICD-10-CM | POA: Diagnosis not present

## 2022-09-23 LAB — RAD ONC ARIA SESSION SUMMARY
Course Elapsed Days: 14
Plan Fractions Treated to Date: 11
Plan Prescribed Dose Per Fraction: 2.5 Gy
Plan Total Fractions Prescribed: 20
Plan Total Prescribed Dose: 50 Gy
Reference Point Dosage Given to Date: 27.5 Gy
Reference Point Session Dosage Given: 2.5 Gy
Session Number: 11

## 2022-09-28 ENCOUNTER — Other Ambulatory Visit: Payer: Self-pay

## 2022-09-28 ENCOUNTER — Ambulatory Visit
Admission: RE | Admit: 2022-09-28 | Discharge: 2022-09-28 | Disposition: A | Discharge status: Home / Self Care | Payer: Medicare Other | Source: Ambulatory Visit | Attending: Radiation Oncology | Admitting: Radiation Oncology

## 2022-09-28 DIAGNOSIS — C44329 Squamous cell carcinoma of skin of other parts of face: Secondary | ICD-10-CM | POA: Diagnosis not present

## 2022-09-28 LAB — RAD ONC ARIA SESSION SUMMARY
Course Elapsed Days: 19
Plan Fractions Treated to Date: 12
Plan Prescribed Dose Per Fraction: 2.5 Gy
Plan Total Fractions Prescribed: 20
Plan Total Prescribed Dose: 50 Gy
Reference Point Dosage Given to Date: 30 Gy
Reference Point Session Dosage Given: 2.5 Gy
Session Number: 12

## 2022-09-29 ENCOUNTER — Other Ambulatory Visit: Payer: Self-pay

## 2022-09-29 ENCOUNTER — Ambulatory Visit
Admission: RE | Admit: 2022-09-29 | Discharge: 2022-09-29 | Disposition: A | Discharge status: Home / Self Care | Payer: Medicare Other | Source: Ambulatory Visit | Attending: Radiation Oncology | Admitting: Radiation Oncology

## 2022-09-29 DIAGNOSIS — C44329 Squamous cell carcinoma of skin of other parts of face: Secondary | ICD-10-CM | POA: Diagnosis not present

## 2022-09-29 LAB — RAD ONC ARIA SESSION SUMMARY
Course Elapsed Days: 20
Plan Fractions Treated to Date: 13
Plan Prescribed Dose Per Fraction: 2.5 Gy
Plan Total Fractions Prescribed: 20
Plan Total Prescribed Dose: 50 Gy
Reference Point Dosage Given to Date: 32.5 Gy
Reference Point Session Dosage Given: 2.5 Gy
Session Number: 13

## 2022-09-30 ENCOUNTER — Other Ambulatory Visit: Payer: Self-pay

## 2022-09-30 ENCOUNTER — Ambulatory Visit
Admission: RE | Admit: 2022-09-30 | Discharge: 2022-09-30 | Disposition: A | Discharge status: Home / Self Care | Payer: Medicare Other | Source: Ambulatory Visit | Attending: Radiation Oncology | Admitting: Radiation Oncology

## 2022-09-30 DIAGNOSIS — C44329 Squamous cell carcinoma of skin of other parts of face: Secondary | ICD-10-CM | POA: Diagnosis not present

## 2022-09-30 LAB — RAD ONC ARIA SESSION SUMMARY
Course Elapsed Days: 21
Plan Fractions Treated to Date: 14
Plan Prescribed Dose Per Fraction: 2.5 Gy
Plan Total Fractions Prescribed: 20
Plan Total Prescribed Dose: 50 Gy
Reference Point Dosage Given to Date: 35 Gy
Reference Point Session Dosage Given: 2.5 Gy
Session Number: 14

## 2022-10-01 ENCOUNTER — Other Ambulatory Visit: Payer: Self-pay

## 2022-10-01 ENCOUNTER — Ambulatory Visit
Admission: RE | Admit: 2022-10-01 | Discharge: 2022-10-01 | Disposition: A | Discharge status: Home / Self Care | Payer: Medicare Other | Source: Ambulatory Visit | Attending: Radiation Oncology | Admitting: Radiation Oncology

## 2022-10-01 DIAGNOSIS — C44329 Squamous cell carcinoma of skin of other parts of face: Secondary | ICD-10-CM | POA: Diagnosis not present

## 2022-10-01 LAB — RAD ONC ARIA SESSION SUMMARY
Course Elapsed Days: 22
Plan Fractions Treated to Date: 15
Plan Prescribed Dose Per Fraction: 2.5 Gy
Plan Total Fractions Prescribed: 20
Plan Total Prescribed Dose: 50 Gy
Reference Point Dosage Given to Date: 37.5 Gy
Reference Point Session Dosage Given: 2.5 Gy
Session Number: 15

## 2022-10-02 ENCOUNTER — Other Ambulatory Visit: Payer: Self-pay

## 2022-10-02 ENCOUNTER — Ambulatory Visit
Admission: RE | Admit: 2022-10-02 | Discharge: 2022-10-02 | Disposition: A | Discharge status: Home / Self Care | Payer: Medicare Other | Source: Ambulatory Visit | Attending: Radiation Oncology | Admitting: Radiation Oncology

## 2022-10-02 DIAGNOSIS — C44329 Squamous cell carcinoma of skin of other parts of face: Secondary | ICD-10-CM | POA: Diagnosis present

## 2022-10-02 DIAGNOSIS — Z510 Encounter for antineoplastic radiation therapy: Secondary | ICD-10-CM | POA: Diagnosis not present

## 2022-10-02 LAB — RAD ONC ARIA SESSION SUMMARY
Course Elapsed Days: 23
Plan Fractions Treated to Date: 16
Plan Prescribed Dose Per Fraction: 2.5 Gy
Plan Total Fractions Prescribed: 20
Plan Total Prescribed Dose: 50 Gy
Reference Point Dosage Given to Date: 40 Gy
Reference Point Session Dosage Given: 2.5 Gy
Session Number: 16

## 2022-10-05 ENCOUNTER — Ambulatory Visit
Admission: RE | Admit: 2022-10-05 | Discharge: 2022-10-05 | Disposition: A | Discharge status: Home / Self Care | Payer: Medicare Other | Source: Ambulatory Visit | Attending: Radiation Oncology | Admitting: Radiation Oncology

## 2022-10-05 ENCOUNTER — Other Ambulatory Visit: Payer: Self-pay

## 2022-10-05 DIAGNOSIS — C44329 Squamous cell carcinoma of skin of other parts of face: Secondary | ICD-10-CM | POA: Diagnosis not present

## 2022-10-05 LAB — RAD ONC ARIA SESSION SUMMARY
Course Elapsed Days: 26
Plan Fractions Treated to Date: 17
Plan Prescribed Dose Per Fraction: 2.5 Gy
Plan Total Fractions Prescribed: 20
Plan Total Prescribed Dose: 50 Gy
Reference Point Dosage Given to Date: 42.5 Gy
Reference Point Session Dosage Given: 2.5 Gy
Session Number: 17

## 2022-10-06 ENCOUNTER — Other Ambulatory Visit: Payer: Self-pay

## 2022-10-06 ENCOUNTER — Ambulatory Visit
Admission: RE | Admit: 2022-10-06 | Discharge: 2022-10-06 | Disposition: A | Discharge status: Home / Self Care | Payer: Medicare Other | Source: Ambulatory Visit | Attending: Radiation Oncology | Admitting: Radiation Oncology

## 2022-10-06 DIAGNOSIS — C44329 Squamous cell carcinoma of skin of other parts of face: Secondary | ICD-10-CM | POA: Diagnosis not present

## 2022-10-06 LAB — RAD ONC ARIA SESSION SUMMARY
Course Elapsed Days: 27
Plan Fractions Treated to Date: 18
Plan Prescribed Dose Per Fraction: 2.5 Gy
Plan Total Fractions Prescribed: 20
Plan Total Prescribed Dose: 50 Gy
Reference Point Dosage Given to Date: 45 Gy
Reference Point Session Dosage Given: 2.5 Gy
Session Number: 18

## 2022-10-07 ENCOUNTER — Ambulatory Visit
Admission: RE | Admit: 2022-10-07 | Discharge: 2022-10-07 | Disposition: A | Discharge status: Home / Self Care | Payer: Medicare Other | Source: Ambulatory Visit | Attending: Radiation Oncology | Admitting: Radiation Oncology

## 2022-10-07 ENCOUNTER — Other Ambulatory Visit: Payer: Self-pay

## 2022-10-07 DIAGNOSIS — C44329 Squamous cell carcinoma of skin of other parts of face: Secondary | ICD-10-CM | POA: Diagnosis not present

## 2022-10-07 LAB — RAD ONC ARIA SESSION SUMMARY
Course Elapsed Days: 28
Plan Fractions Treated to Date: 19
Plan Prescribed Dose Per Fraction: 2.5 Gy
Plan Total Fractions Prescribed: 20
Plan Total Prescribed Dose: 50 Gy
Reference Point Dosage Given to Date: 47.5 Gy
Reference Point Session Dosage Given: 2.5 Gy
Session Number: 19

## 2022-10-08 ENCOUNTER — Ambulatory Visit
Admission: RE | Admit: 2022-10-08 | Discharge: 2022-10-08 | Disposition: A | Discharge status: Home / Self Care | Payer: Medicare Other | Source: Ambulatory Visit | Attending: Radiation Oncology | Admitting: Radiation Oncology

## 2022-10-08 ENCOUNTER — Other Ambulatory Visit: Payer: Self-pay

## 2022-10-08 DIAGNOSIS — C44329 Squamous cell carcinoma of skin of other parts of face: Secondary | ICD-10-CM | POA: Diagnosis not present

## 2022-10-08 LAB — RAD ONC ARIA SESSION SUMMARY
Course Elapsed Days: 29
Plan Fractions Treated to Date: 20
Plan Prescribed Dose Per Fraction: 2.5 Gy
Plan Total Fractions Prescribed: 20
Plan Total Prescribed Dose: 50 Gy
Reference Point Dosage Given to Date: 50 Gy
Reference Point Session Dosage Given: 2.5 Gy
Session Number: 20

## 2022-10-08 NOTE — Progress Notes (Signed)
Oncology Nurse Navigator Documentation   Met with Leroy [name] after final RT to offer support and to celebrate end of radiation treatment. He tolerated treatment well and will see Dr. Isidore Moos for follow up January 2024. He knows to call me with any questions or concerns.   Harlow Asa RN, BSN, OCN Head & Neck Oncology Nurse [place] at [facility] Phone # [phone]  Fax # [phone]

## 2022-10-28 NOTE — Progress Notes (Signed)
Mr. Leroy [name] presents today for follow up for cancer of his forehead. He completed radiation treatment on 10-08-22.   Pain issues, if any: none to report Using a feeding tube?: none Weight changes, if any: none to report Wt Readings from Last 3 Encounters:  11/11/22 215 lb 9.6 oz (97.8 kg)  11/06/22 218 lb 4.1 oz (99 kg)  08/25/22 218 lb 3.2 oz (99 kg)    Swallowing issues, if any: no problem eating or drinking Smoking or chewing tobacco? Chewing tobacco Using fluoride trays daily? none Last ENT visit was on: 9 months ago, dermatologist in December Other notable issues, if any: had rash pop on stomach now to back and neck, better now after using fluocinonide, recent work up for r/o cva due to staring  Vitals:   11/11/22 1511  BP: 132/67  Pulse: 88  Resp: 20  Temp: 97.6 F (36.4 C)  SpO2: 97%

## 2022-11-05 ENCOUNTER — Other Ambulatory Visit: Payer: Medicare Other

## 2022-11-05 ENCOUNTER — Encounter: Payer: Medicare Other | Admitting: Dermatology

## 2022-11-05 DIAGNOSIS — Z515 Encounter for palliative care: Secondary | ICD-10-CM

## 2022-11-05 NOTE — Progress Notes (Signed)
PATIENT NAME: DECLYN DELSOL DOB: August 27, 1930 MRN: [id number]  PRIMARY CARE PROVIDER: Idelle Crouch, MD  RESPONSIBLE PARTY:  Acct ID - Guarantor Home Phone Work Phone Relationship Acct Type  [id number] ARYON, [phone]  Self P/F     [address] [place], [place], [place] [address]    I connected with [name]-daughter for [name] on 11/05/22 by telephone and verified that I am speaking with the correct person using two identifiers.   I discussed the limitations of evaluation and management by telemedicine. The patient expressed understanding and agreed to proceed.   Decline reported by daughter [name] as listed below.    Patient completed treatment for skin cancer and tolerated this well.  On 10/28/22, daughter was informed by patient's wife that patient was having a difficult time getting out of bed and starring.  Upon her arrival, she questioned another TIA or possibly stroke.  They elected to continue to monitor patient.  Daughter shared that patient had experienced 2 additional strokes since his first stroke in 2017.  This showed on an MRI that was completed and discussed with Dr. Manuella Ghazi.  Patient continues to sleep for longer periods of time.  Now having to be awakened by family and sometimes sleeping till 12-1 pm.  Dizziness was an issue for 2 days after the initial symptoms presented but was treated with meclizine and are now resolved.  Patient continues to eat with no significant weight loss. Most recent weight of 214 lbs per daughter.  Rash present to abdomen but is improving.  Patient is rarely able to follow conversations. They will follow up with PCP on 1/17.  Daughter discussed the difficulties of getting patient out of the home.  We discussed home-base providers as an option if patient was no longer able to do in-person visits with current PCP.   Daughter will let me know if this is something they need to do otherwise will remain with current PCP.  Follow up visit  scheduled for next Friday.     CODE STATUS: DNR ADVANCED DIRECTIVES: Yes MOST FORM: No   Lorenza Burton, RN

## 2022-11-06 ENCOUNTER — Other Ambulatory Visit: Payer: Self-pay

## 2022-11-06 ENCOUNTER — Encounter (HOSPITAL_COMMUNITY): Payer: Self-pay | Admitting: Emergency Medicine

## 2022-11-06 ENCOUNTER — Other Ambulatory Visit: Payer: Medicare Other

## 2022-11-06 ENCOUNTER — Emergency Department (HOSPITAL_COMMUNITY): Payer: Medicare Other

## 2022-11-06 ENCOUNTER — Emergency Department (HOSPITAL_COMMUNITY)
Admission: EM | Admit: 2022-11-06 | Discharge: 2022-11-06 | Disposition: A | Discharge status: Home / Self Care | Payer: Medicare Other | Attending: Emergency Medicine | Admitting: Emergency Medicine

## 2022-11-06 DIAGNOSIS — Z1152 Encounter for screening for COVID-19: Secondary | ICD-10-CM | POA: Diagnosis not present

## 2022-11-06 DIAGNOSIS — N189 Chronic kidney disease, unspecified: Secondary | ICD-10-CM | POA: Diagnosis not present

## 2022-11-06 DIAGNOSIS — F0150 Vascular dementia without behavioral disturbance: Secondary | ICD-10-CM | POA: Insufficient documentation

## 2022-11-06 DIAGNOSIS — R058 Other specified cough: Secondary | ICD-10-CM | POA: Insufficient documentation

## 2022-11-06 DIAGNOSIS — R4182 Altered mental status, unspecified: Secondary | ICD-10-CM | POA: Insufficient documentation

## 2022-11-06 DIAGNOSIS — Z515 Encounter for palliative care: Secondary | ICD-10-CM

## 2022-11-06 DIAGNOSIS — I129 Hypertensive chronic kidney disease with stage 1 through stage 4 chronic kidney disease, or unspecified chronic kidney disease: Secondary | ICD-10-CM | POA: Insufficient documentation

## 2022-11-06 DIAGNOSIS — R404 Transient alteration of awareness: Secondary | ICD-10-CM

## 2022-11-06 LAB — CBC WITH DIFFERENTIAL/PLATELET
Abs Immature Granulocytes: 0.01 10*3/uL (ref 0.00–0.07)
Basophils Absolute: 0 10*3/uL (ref 0.0–0.1)
Basophils Relative: 1 %
Eosinophils Absolute: 0.2 10*3/uL (ref 0.0–0.5)
Eosinophils Relative: 4 %
HCT: 27.1 % — ABNORMAL LOW (ref 39.0–52.0)
Hemoglobin: 8.7 g/dL — ABNORMAL LOW (ref 13.0–17.0)
Immature Granulocytes: 0 %
Lymphocytes Relative: 30 %
Lymphs Abs: 2 10*3/uL (ref 0.7–4.0)
MCH: 28.6 pg (ref 26.0–34.0)
MCHC: 32.1 g/dL (ref 30.0–36.0)
MCV: 89.1 fL (ref 80.0–100.0)
Monocytes Absolute: 0.5 10*3/uL (ref 0.1–1.0)
Monocytes Relative: 8 %
Neutro Abs: 3.9 10*3/uL (ref 1.7–7.7)
Neutrophils Relative %: 57 %
Platelets: 266 10*3/uL (ref 150–400)
RBC: 3.04 MIL/uL — ABNORMAL LOW (ref 4.22–5.81)
RDW: 13.4 % (ref 11.5–15.5)
WBC: 6.6 10*3/uL (ref 4.0–10.5)
nRBC: 0 % (ref 0.0–0.2)

## 2022-11-06 LAB — COMPREHENSIVE METABOLIC PANEL
ALT: 11 U/L (ref 0–44)
AST: 19 U/L (ref 15–41)
Albumin: 3 g/dL — ABNORMAL LOW (ref 3.5–5.0)
Alkaline Phosphatase: 56 U/L (ref 38–126)
Anion gap: 7 (ref 5–15)
BUN: 21 mg/dL (ref 8–23)
CO2: 26 mmol/L (ref 22–32)
Calcium: 8.6 mg/dL — ABNORMAL LOW (ref 8.9–10.3)
Chloride: 106 mmol/L (ref 98–111)
Creatinine, Ser: 1.55 mg/dL — ABNORMAL HIGH (ref 0.61–1.24)
GFR, Estimated: 42 mL/min — ABNORMAL LOW (ref >60)
Glucose, Bld: 90 mg/dL (ref 70–99)
Potassium: 3.7 mmol/L (ref 3.5–5.1)
Sodium: 139 mmol/L (ref 135–145)
Total Bilirubin: 0.5 mg/dL (ref 0.3–1.2)
Total Protein: 6.1 g/dL — ABNORMAL LOW (ref 6.5–8.1)

## 2022-11-06 LAB — I-STAT CHEM 8, ED
BUN: 22 mg/dL (ref 8–23)
Calcium, Ion: 1.18 mmol/L (ref 1.15–1.40)
Chloride: 105 mmol/L (ref 98–111)
Creatinine, Ser: 1.6 mg/dL — ABNORMAL HIGH (ref 0.61–1.24)
Glucose, Bld: 85 mg/dL (ref 70–99)
HCT: 26 % — ABNORMAL LOW (ref 39.0–52.0)
Hemoglobin: 8.8 g/dL — ABNORMAL LOW (ref 13.0–17.0)
Potassium: 3.8 mmol/L (ref 3.5–5.1)
Sodium: 140 mmol/L (ref 135–145)
TCO2: 24 mmol/L (ref 22–32)

## 2022-11-06 LAB — RESP PANEL BY RT-PCR (RSV, FLU A&B, COVID)  RVPGX2
Influenza A by PCR: NEGATIVE
Influenza B by PCR: NEGATIVE
Resp Syncytial Virus by PCR: NEGATIVE
SARS Coronavirus 2 by RT PCR: NEGATIVE

## 2022-11-06 LAB — URINALYSIS, ROUTINE W REFLEX MICROSCOPIC
Bilirubin Urine: NEGATIVE
Glucose, UA: NEGATIVE mg/dL
Hgb urine dipstick: NEGATIVE
Ketones, ur: NEGATIVE mg/dL
Leukocytes,Ua: NEGATIVE
Nitrite: NEGATIVE
Protein, ur: NEGATIVE mg/dL
Specific Gravity, Urine: 1.013 (ref 1.005–1.030)
pH: 5 (ref 5.0–8.0)

## 2022-11-06 LAB — I-STAT VENOUS BLOOD GAS, ED
Acid-Base Excess: 0 mmol/L (ref 0.0–2.0)
Bicarbonate: 25.9 mmol/L (ref 20.0–28.0)
Calcium, Ion: 1.21 mmol/L (ref 1.15–1.40)
HCT: 26 % — ABNORMAL LOW (ref 39.0–52.0)
Hemoglobin: 8.8 g/dL — ABNORMAL LOW (ref 13.0–17.0)
O2 Saturation: 72 %
Potassium: 3.8 mmol/L (ref 3.5–5.1)
Sodium: 141 mmol/L (ref 135–145)
TCO2: 27 mmol/L (ref 22–32)
pCO2, Ven: 45.1 mmHg (ref 44–60)
pH, Ven: 7.367 (ref 7.25–7.43)
pO2, Ven: 39 mmHg (ref 32–45)

## 2022-11-06 NOTE — Discharge Instructions (Signed)
Thank you for letting us take care of you today.  As discussed, your dad's workup is not complete, however, you have opted to take him home. The workup we have so far does not show any explanation for the staring spells your dad is having. It is very important pt follow up with his primary care provider to discuss any further testing or imaging he should undergo for these episodes. I also recommend pt see his neurologist as soon as possible. Please notify neurologist of episodes pt is having and schedule a follow up appointment at their recommendation. They may want to do an MRI or EEG to look for further causes of pt's symptoms.  Should pt develop chest pain, shortness of breath, worsening confusion, weakness, recurrent seizure like activity, fever, inability to eat or drink, or other concerns, it is important you bring him back to nearest emergency department for re-evaluation.

## 2022-11-06 NOTE — Progress Notes (Signed)
PATIENT NAME: [name] DOB: Jan 08, 1930 MRN: [id number]  PRIMARY CARE PROVIDER: Idelle Crouch, MD  RESPONSIBLE PARTY:  Acct ID - Guarantor Home Phone Work Phone Relationship Acct Type  [id number] [name], [name]  Self P/F     [address] [place], [place], [place] [address]   I connected with  [name]-daughter for [name] on 11/06/22 by telephone and verified that I am speaking with the correct person using two identifiers.   I discussed the limitations of evaluation and management by telemedicine. The patient expressed understanding and agreed to proceed.  Incoming call from daughter [name].  Patient is having a similar episode this am as he did on 10/28/22.  [name] states patient's wife is reporting starring and patient is incoherent.  Discussed further evaluation as this is the 2nd episode in 1 1/2 weeks.  Encouraged activating 911 and to have patient further evaluated in the ED.  [name] advised she would be going to the home and would likely have patient transported to [facility] ED for evaluation.  Advised that I would let our hospital liaison team know so they can follow along.      Lorenza Burton, RN

## 2022-11-06 NOTE — ED Provider Notes (Signed)
Care assumed from day shift PA, Theodis Blaze. Pt is a 89 year old male on palliative care due to history of vascular dementia who was brought to ED  by daughter for a 5 minute episode of altered mental status where pt was staring into space, mumbling, more confused than his baseline, and when he tried to stand up from his bed he fell back on to the bed. On EMS arrival, it was reported pt was hypertensive. Daughter reported that pt had a very similar episode around Christmas and did not return to his baseline for about 36 hours. During this episode, pt required a walker to assist with ambulation. At baseline, he is able to ambulate unassisted. Daughter denies any loss of consciousness, shaking, tongue biting, or incontinence during either episode. On handoff, no explanation for symptoms so far and pt had negative CT head, negative CXR, and iStat consistent with his baseline. Mental status back to baseline per daughter, pt oriented to self only. Pending remainder of infectious workup (UA, CMP, viral swabs) and anticipated discharge if pt is able to ambulate. Consider consult to neurology to discuss possibility of MRI and further neurological consult.   Unfortunately, lab is down and results are delayed and taking several hours to come back. I went to recheck patient and update daughter who is unhappy with wait times. Daughter concerned because due to environment patient is becoming increasingly confused with his history of dementia and she would like to take him home before he deteriorates further. She also expressed concern noting that when pt needed to urinate nursing staff provided a bed pain to her but did not provide further assistance with pt going to the bathroom and she felt this was inappropriate and did not show dignity toward pt. I apologized to daughter as I agree this was inappropriate and pt should have been further assisted. Daughter does not want to wait for pending workup with extended ED wait  times, pt's history of dementia and increasing confusion in unfamiliar environment, and dissatisfaction with nursing staff. As pt is currently at his mental baseline, otherwise neurologically intact, and there are no explanations in his workup so far to explain his symptoms with pt currently on palliative care and per daughter will not undergo extensive interventions should anything return abnormal, I believe it is reasonable to discharge patient home in the daughter's care. She is well versed in his condition and a reliable caregiver. I did discuss the possibility of a neurology consultation and/or MRI scan for further evaluation of pt's symptoms. Daughter declined both of these recommendations and opted to take pt home with agreement to follow up closely with PCP and outpatient neurologist. Discussed signs of seizure like activity and reasons to  bring pt back to ED. Should UA return with signs of infection, I will contact daughter via MyChart and prescribe antibiotics (this returned negative). All daughter's questions answered. Alert to person only but otherwise nonfocal and intact neurological exam on my reassessment. Pt stable for discharge.    Leroy [name] 11/06/22 [place], [place], DO 11/06/22 2348

## 2022-11-06 NOTE — Progress Notes (Signed)
[place] [facility]) Hospital Liaison note:  This patient is currently enrolled in [facility] outpatient-based Palliative Care. Will continue to follow for disposition.  Please call with any outpatient palliative questions or concerns.  Thank you, Lorelee Market, LPN [facility] Liaison [phone]

## 2022-11-06 NOTE — ED Provider Notes (Signed)
[name] EMERGENCY DEPARTMENT Provider Note   CSN: [id number] Arrival date & time: 11/06/22  1236     History  Chief Complaint  Patient presents with   Altered Mental Status    Leroy [name] is a 89 y.o. male. With past medical history of stroke, vascular dementia, HTN, CKD, SCC, who presents to the emergency department with altered mental status.  Presents with daughter who provides most of the history. She states that this morning around 1030 patient had a few minutes episode of "staring off into space." She states that he attempted to sit up on the side of the bed and then fell back into bed and began staring. There was no shaking, incontinence or tongue biting to this episode. EMS was called and reported his BP to be "very high." She states he is back to his baseline currently. She notes that he had a similar episode on 10/28/22 where he was staring and mumbling for a few minutes. She states that they eventually got him up but he was weak and felt dizzy. She states that it took him about 36 hours to recover from this episode. During that time he was requiring the use of a walker which is not his baseline. Patient states that he has had intermittent headache recently, but just finished radiation to the left forehead for skin cancer. He denies recent falls or head trauma, fever, cough, dysuria. The patient denies having any changes to his vision, nausea or vomiting, chest pain or shortness of breath.    Altered Mental Status Presenting symptoms: confusion   Associated symptoms: headaches and weakness        Home Medications Prior to Admission medications   Medication Sig Start Date End Date Taking? Authorizing Provider  cholecalciferol (VITAMIN D) 1000 units tablet Take 2,000 Units by mouth daily.     [provider]  clopidogrel (PLAVIX) 75 MG tablet Take 1 tablet (75 mg total) by mouth daily. 03/09/21   Pokhrel, Corrie Mckusick, MD  cyanocobalamin (,VITAMIN  B-12,) 1000 MCG/ML injection Inject 1,000 mcg into the muscle every 30 (thirty) days.    [provider]  Docusate Sodium (DSS) 100 MG CAPS Take by mouth.    [provider]  ezetimibe (ZETIA) 10 MG tablet Take 10 mg by mouth at bedtime. 11/05/20   [provider]  isosorbide mononitrate (IMDUR) 30 MG 24 hr tablet Take by mouth. 07/15/22 07/15/23  [provider]  levothyroxine (SYNTHROID) 50 MCG tablet Take 50 mcg by mouth daily. 11/08/20   [provider]  metoprolol succinate (TOPROL-XL) 25 MG 24 hr tablet Take 12.5 mg by mouth in the morning.  02/27/20 02/26/21  [provider]  mupirocin ointment (BACTROBAN) 2 % Apply 1 application topically as directed. Qd to bid to aa face Patient not taking: Reported on 08/25/2022 09/29/21   Brendolyn Patty, MD  pantoprazole (PROTONIX) 40 MG tablet Take 1 tablet (40 mg total) by mouth 2 (two) times daily before a meal for 30 days, THEN 1 tablet (40 mg total) daily. 03/06/21 05/05/21  Pokhrel, Corrie Mckusick, MD  pantoprazole (PROTONIX) 40 MG tablet Take by mouth. 02/23/22   [provider]  silodosin (RAPAFLO) 4 MG CAPS capsule TAKE 1 CAPSULE BY MOUTH ONCE DAILY WITH BREAKFAST 07/20/22   Stoioff, Ronda Fairly, MD  sulfamethoxazole-trimethoprim (BACTRIM DS) 800-160 MG tablet Take 1 tablet by mouth 2 (two) times daily for 5 days. 11/04/21 11/09/21  Alfonso Patten, MD      Allergies  Aspirin    Review of Systems   Review of Systems  Neurological:  Positive for weakness and headaches.  Psychiatric/Behavioral:  Positive for confusion.   All other systems reviewed and are negative.   Physical Exam Updated Vital Signs BP (!) 162/78   Pulse 63   Temp 97.9 F (36.6 C) (Oral)   Resp 18   Ht '6\' 1"'$  (1.854 m)   Wt 99 kg   SpO2 99%   BMI 28.80 kg/m  Physical Exam Vitals and nursing note reviewed.  Constitutional:      General: He is not in acute distress.    Appearance: Normal appearance. He is not ill-appearing or  toxic-appearing.  HENT:     Head: Normocephalic.     Comments: Radiation burn/redness to the left forehead     Mouth/Throat:     Mouth: Mucous membranes are moist.     Pharynx: Oropharynx is clear.  Eyes:     General: No scleral icterus.    Extraocular Movements: Extraocular movements intact.     Pupils: Pupils are equal, round, and reactive to light.  Cardiovascular:     Rate and Rhythm: Normal rate and regular rhythm.     Pulses: Normal pulses.     Heart sounds: No murmur heard. Pulmonary:     Effort: Pulmonary effort is normal. No respiratory distress.     Breath sounds: Normal breath sounds. No wheezing, rhonchi or rales.  Abdominal:     General: Bowel sounds are normal. There is no distension.     Palpations: Abdomen is soft.     Tenderness: There is no abdominal tenderness.  Musculoskeletal:        General: Normal range of motion.     Cervical back: Neck supple.  Skin:    General: Skin is warm and dry.     Capillary Refill: Capillary refill takes less than 2 seconds.  Neurological:     General: No focal deficit present.     Mental Status: He is alert. Mental status is at baseline.     Cranial Nerves: No cranial nerve deficit.     Motor: No weakness.     Comments: Knows name. Unclear to place, time.   Psychiatric:        Attention and Perception: Attention normal.        Mood and Affect: Mood and affect normal.        Speech: Speech normal.        Behavior: Behavior is slowed.        Cognition and Memory: Memory is impaired.     ED Results / Procedures / Treatments   Labs (all labs ordered are listed, but only abnormal results are displayed) Labs Reviewed  CBC WITH DIFFERENTIAL/PLATELET - Abnormal; Notable for the following components:      Result Value   RBC 3.04 (*)    Hemoglobin 8.7 (*)    HCT 27.1 (*)    All other components within normal limits  I-STAT CHEM 8, ED - Abnormal; Notable for the following components:   Creatinine, Ser 1.60 (*)    Hemoglobin  8.8 (*)    HCT 26.0 (*)    All other components within normal limits  I-STAT VENOUS BLOOD GAS, ED - Abnormal; Notable for the following components:   HCT 26.0 (*)    Hemoglobin 8.8 (*)    All other components within normal limits  RESP PANEL BY RT-PCR (RSV, FLU A&B, COVID)  [id number]  COMPREHENSIVE METABOLIC PANEL  URINALYSIS, ROUTINE W REFLEX MICROSCOPIC  CBG MONITORING, ED    EKG None  Radiology DG Chest Port 1 View  Result Date: 11/06/2022 CLINICAL DATA:  Cough.  Altered mental status EXAM: PORTABLE CHEST 1 VIEW COMPARISON:  X-ray 08/04/2016 and older FINDINGS: Enlarged cardiopericardial silhouette. Overlapping cardiac leads. Calcified aorta. Underinflation. Mild interstitial prominence. No pneumothorax or effusion. No consolidation. IMPRESSION: Enlarged cardiopericardial silhouette. Underinflation with some interstitial prominence. Electronically Signed   By: Jill Side M.D.   On: 11/06/2022 14:37   CT Head Wo Contrast  Result Date: 11/06/2022 CLINICAL DATA:  Mental status change, unknown cause. Episode of unresponsiveness. EXAM: CT HEAD WITHOUT CONTRAST TECHNIQUE: Contiguous axial images were obtained from the base of the skull through the vertex without intravenous contrast. RADIATION DOSE REDUCTION: This exam was performed according to the departmental dose-optimization program which includes automated exposure control, adjustment of the mA and/or kV according to patient size and/or use of iterative reconstruction technique. COMPARISON:  Head CT 12/15/2021 and MRI 03/05/2022 FINDINGS: Brain: There is no evidence of an acute infarct, intracranial hemorrhage, mass, midline shift, or extra-axial fluid collection. Hypodensities in the cerebral white matter bilaterally are unchanged and nonspecific but compatible with moderate chronic small vessel ischemic disease. Small discrete hypodensities in the basal ganglia/internal capsule regions bilaterally may represent a combination of dilated  perivascular spaces and chronic lacunar infarcts based on MRI. A small chronic right cerebellar infarct is again noted. There is mild-to-moderate cerebral atrophy. Vascular: Calcified atherosclerosis at the skull base. No hyperdense vessel. Skull: No fracture or suspicious osseous lesion. Sinuses/Orbits: Visualized paranasal sinuses and mastoid air cells are clear. Bilateral cataract extraction. Other: None. IMPRESSION: 1. No evidence of acute intracranial abnormality. 2. Moderate chronic small vessel ischemic disease. Electronically Signed   By: Logan Bores M.D.   On: 11/06/2022 14:03    Procedures Procedures   Medications Ordered in ED Medications - No data to display  ED Course/ Medical Decision Making/ A&P                           Medical Decision Making Amount and/or Complexity of Data Reviewed Labs: ordered. Radiology: ordered.  Care of patient handed off to [facility], PA-C at change of shift. Patient pending completed lab work up. If negative, likely touch base with neurology and likely dispo home. Will need to ambulate/baseline mobility status prior to discharge.  Final Clinical Impression(s) / ED Diagnoses Final diagnoses:  None    Rx / DC Orders ED Discharge Orders     None         Mickie Hillier, PA-C 11/06/22 1509    Cristie Hem, MD 11/06/22 [phone]

## 2022-11-06 NOTE — Radiation Completion Notes (Signed)
Patient Name: Leroy [name], Leroy [name] MRN: [id number] Date of Birth: [birth year] Referring Physician: Karin Golden, M.D. Date of Service: 2022-11-06 Radiation Oncologist: Eppie Gibson, M.D. 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, M=cM0 Intent: Curative     ==========DELIVERED PLANS==========  First Treatment Date: 2022-09-09 - Last Treatment Date: 2022-10-08   Plan Name: HN_L_Foreh Site: Face Technique: Electron Mode: Electron Dose Per Fraction: 2.5 Gy Prescribed Dose (Delivered / Prescribed): 50 Gy / 50 Gy Prescribed Fxs (Delivered / Prescribed): 20 / 20     ==========ON TREATMENT VISIT DATES========== 2022-09-14, 2022-09-21, 2022-09-28, 2022-10-05     ==========UPCOMING VISITS========== 2024-01-10T20:30:00Z Bad Axe, MD; Eppie Gibson, MD        ==========APPENDIX - ON TREATMENT VISIT NOTES==========   PatEd 2022-09-14 Ongoing education performed.   ImpPlan 2022-09-14 The patient is tolerating radiation. Continue treatment as planned.   PhysExam 2022-09-14 Alert, no acute distress.   ProgNote 2022-09-14 Changes from last week/visit? [ Yes, more shoulder pain with treatments ] Pain? [ Yes, bilat shoulder pain ] Dysphagia? [ No ] Thick saliva/mouth irritation? [ No ] Mouth ulcers? [ No ] PEG tube? Any issues? [ No ] Have they received chemo (at any point during their radiation treatment)? [ No ] Taking anything by mouth or all via PEG? [ na ] How much clear fluid are they taking in? [ Corinna.Lulas  ] Are they doing their salt/baking soda rinses? [ No - nursing reinforced patient education ] Need refill on lotions? [ No ] Need refills: [ No ] Additional  Weekly Progress Notes [ uses chewing tobacco ]     RunningNotes 2022-09-14 09-14-22 education performed   PatEd 2022-09-21 Ongoing education performed.   ImpPlan 2022-09-21 The patient is tolerating radiation. Continue treatment as planned.   PhysExam 2022-09-21 Alert, no acute distress.   ProgNote 2022-09-21 Changes from last week/visit? [ No ] Pain? [ No ] Dysphagia? [ No ] Thick saliva/mouth irritation? [ No ] Mouth ulcers? [ No ] PEG tube? Any issues? [ No ] Have they received chemo (at any point during their radiation treatment)? [ No ] Taking anything by mouth or all via PEG? [ na ] How much clear fluid are they taking in? [ 64 oz ] Are they doing their salt/baking soda rinses? [ No - nursing reinforced patient education ] Need refill on lotions? [ Yes ] Need refills: [  ] Additional  Weekly Progress Notes [ no major issues this week ]    PatEd 2022-09-28 Ongoing education performed.   ImpPlan 2022-09-28 The patient is tolerating radiation. Continue treatment as planned.   PhysExam 2022-09-28 Alert, no acute distress.   ProgNote 2022-09-28 Changes from last week/visit? [ Yes ] Pain? [ No ] Dysphagia? [ Yes, with swallowing dry foods ] Thick saliva/mouth irritation? [ No ] Mouth ulcers? [ No ] PEG tube? Any issues? [ no ] Have they received chemo (at any point during their radiation treatment)? [ No ] Taking anything by mouth or all via PEG? [ na ] How much clear fluid are they taking in? [ Luna.Denis  ] Are they doing their salt/baking soda rinses? [  No - nursing reinforced patient education ] Need refill on lotions? [ Yes ] Need refills: [ No ] Additional  Weekly Progress Notes [ no major issues this week ]    PatEd 2022-10-05 Ongoing education performed.   ImpPlan 2022-10-05 The patient is tolerating radiation. Continue treatment as planned.   PhysExam 2022-10-05 Alert, no acute distress.   ProgNote 2022-10-05 Need refills: [  ] Additional  Weekly Progress Notes [ Patient states he is doing well with his  treatments. He reports mild fatigue, no different from his baseline.  Skin to his forehead remains intact and has some redness.  Denies pain.  He was given a follow-up appointment. ]

## 2022-11-06 NOTE — ED Triage Notes (Signed)
Pt arrives from home via EMS with reports of 5-10 min episode of starring into space and unresponsive to family.  Upon EMS arrival, pt was A&O x2 at baseline.  Pt is hospice pt, hospice was called and recommended evaluation.

## 2022-11-09 ENCOUNTER — Other Ambulatory Visit: Payer: Self-pay | Admitting: Urology

## 2022-11-11 ENCOUNTER — Ambulatory Visit
Admission: RE | Admit: 2022-11-11 | Discharge: 2022-11-11 | Disposition: A | Discharge status: Home / Self Care | Payer: Medicare Other | Source: Ambulatory Visit | Attending: Radiation Oncology | Admitting: Radiation Oncology

## 2022-11-11 ENCOUNTER — Other Ambulatory Visit: Payer: Self-pay

## 2022-11-11 ENCOUNTER — Encounter: Payer: Self-pay | Admitting: Radiation Oncology

## 2022-11-11 VITALS — BP 132/67 | HR 88 | Temp 97.6°F | Resp 20 | Ht 73.0 in | Wt 215.6 lb

## 2022-11-11 DIAGNOSIS — C44329 Squamous cell carcinoma of skin of other parts of face: Secondary | ICD-10-CM

## 2022-11-11 NOTE — Progress Notes (Signed)
Radiation Oncology         [phone] ________________________________  Name: Leroy [name] MRN: [id number]  Date: 11/11/2022  DOB: 1930/03/06  Follow-Up Visit Note  CC: Leroy Crouch, MD  Leroy Golden, MD  Diagnosis and Prior Radiotherapy:       ICD-10-CM   1. Squamous cell carcinoma of forehead  C44.329       Cancer Staging  Squamous cell carcinoma of forehead Staging form: Cutaneous Carcinoma of the Head and Neck, AJCC 8th Edition - Pathologic stage from 08/25/2022: Stage III (pT3, pN0, cM0) - Signed by Eppie Gibson, MD on 08/25/2022 Extraosseous extension: Absent   First Treatment Date: 2022-09-09 - Last Treatment Date: 2022-10-08   Plan Name: HN_L_Foreh Site: Face Technique: Electron Mode: Electron Dose Per Fraction: 2.5 Gy Prescribed Dose (Delivered / Prescribed): 50 Gy / 50 Gy Prescribed Fxs (Delivered / Prescribed): 20 / 20   CHIEF COMPLAINT:  Here for follow-up and surveillance of squamous cell carcinoma of the left forehead with PNI involving at least one nerve.  Narrative:  The patient returns today for routine follow-up. He completed 50 Gy in 20 fractions on 10/08/22 to the his left forehead. He tolerated treatment relatively well, experiencing only mild skin irritation throughout his treatment.   Of note, he went to the ED on 11/06/22 for a brief episode of altered mental status lasting 5 minutes. CT head, CXR, and labs all came back unremarkable with no explanation of symptoms. He was discharged home that same day and told to consider neurology consult. Patient does have baseline dementia and is currently on palliative care.  Today he is present with his supportive wife and daughter. He is doing well overall and has been applying Vaseline to his left forehead. The patient and his family are very pleased with his response overall.   ALLERGIES:  is allergic to aspirin.  Meds: Current Outpatient Medications  Medication Sig Dispense Refill    cholecalciferol (VITAMIN D) 1000 units tablet Take 2,000 Units by mouth daily.      clopidogrel (PLAVIX) 75 MG tablet Take 1 tablet (75 mg total) by mouth daily.     cyanocobalamin (,VITAMIN B-12,) 1000 MCG/ML injection Inject 1,000 mcg into the muscle every 30 (thirty) days.     Docusate Sodium (DSS) 100 MG CAPS Take by mouth.     ezetimibe (ZETIA) 10 MG tablet Take 10 mg by mouth at bedtime.     isosorbide mononitrate (IMDUR) 30 MG 24 hr tablet Take by mouth.     levothyroxine (SYNTHROID) 50 MCG tablet Take 50 mcg by mouth daily.     metoprolol succinate (TOPROL-XL) 25 MG 24 hr tablet Take 12.5 mg by mouth in the morning.      mupirocin ointment (BACTROBAN) 2 % Apply 1 application topically as directed. Qd to bid to aa face 22 g 0   pantoprazole (PROTONIX) 40 MG tablet Take by mouth.     silodosin (RAPAFLO) 4 MG CAPS capsule TAKE 1 CAPSULE BY MOUTH ONCE DAILY WITH BREAKFAST 30 capsule 3   pantoprazole (PROTONIX) 40 MG tablet Take 1 tablet (40 mg total) by mouth 2 (two) times daily before a meal for 30 days, THEN 1 tablet (40 mg total) daily. 90 tablet 2   sulfamethoxazole-trimethoprim (BACTRIM DS) 800-160 MG tablet Take 1 tablet by mouth 2 (two) times daily for 5 days. 10 tablet 1   No current facility-administered medications for this encounter.    Physical Findings: The patient is in no acute distress.  Patient is alert and oriented. Wt Readings from Last 3 Encounters:  11/11/22 215 lb 9.6 oz (97.8 kg)  11/06/22 218 lb 4.1 oz (99 kg)  08/25/22 218 lb 3.2 oz (99 kg)    height is '6\' 1"'$  (1.854 m) and weight is 215 lb 9.6 oz (97.8 kg). His temperature is 97.6 F (36.4 C). His blood pressure is 132/67 and his pulse is 88. His respiration is 20 and oxygen saturation is 97%. .  In general this is a well appearing male in no acute distress. He's alert and appropriate throughout the examination. Cardiopulmonary assessment is negative for acute distress and he exhibits normal effort.     Skin:  Skin in treatment fields shows satisfactory healing. Erythema remaining over the left forehead with some skin flaking due to dryness. Surgical incision is well approximated and shows no concerning signs of infection or dehiscence. No palpable nodules. Neck: No palpable facial, cervical or supraclavicular lymphadenopathy.    Lab Findings: Lab Results  Component Value Date   WBC 6.6 11/06/2022   HGB 8.8 (L) 11/06/2022   HGB 8.8 (L) 11/06/2022   HCT 26.0 (L) 11/06/2022   HCT 26.0 (L) 11/06/2022   MCV 89.1 11/06/2022   PLT 266 11/06/2022    No results found for: "TSH"  Radiographic Findings: DG Chest Port 1 View  Result Date: 11/06/2022 CLINICAL DATA:  Cough.  Altered mental status EXAM: PORTABLE CHEST 1 VIEW COMPARISON:  X-ray 08/04/2016 and older FINDINGS: Enlarged cardiopericardial silhouette. Overlapping cardiac leads. Calcified aorta. Underinflation. Mild interstitial prominence. No pneumothorax or effusion. No consolidation. IMPRESSION: Enlarged cardiopericardial silhouette. Underinflation with some interstitial prominence. Electronically Signed   By: Jill Side M.D.   On: 11/06/2022 14:37   CT Head Wo Contrast  Result Date: 11/06/2022 CLINICAL DATA:  Mental status change, unknown cause. Episode of unresponsiveness. EXAM: CT HEAD WITHOUT CONTRAST TECHNIQUE: Contiguous axial images were obtained from the base of the skull through the vertex without intravenous contrast. RADIATION DOSE REDUCTION: This exam was performed according to the departmental dose-optimization program which includes automated exposure control, adjustment of the mA and/or kV according to patient size and/or use of iterative reconstruction technique. COMPARISON:  Head CT 12/15/2021 and MRI 03/05/2022 FINDINGS: Brain: There is no evidence of an acute infarct, intracranial hemorrhage, mass, midline shift, or extra-axial fluid collection. Hypodensities in the cerebral white matter bilaterally are unchanged and nonspecific  but compatible with moderate chronic small vessel ischemic disease. Small discrete hypodensities in the basal ganglia/internal capsule regions bilaterally may represent a combination of dilated perivascular spaces and chronic lacunar infarcts based on MRI. A small chronic right cerebellar infarct is again noted. There is mild-to-moderate cerebral atrophy. Vascular: Calcified atherosclerosis at the skull base. No hyperdense vessel. Skull: No fracture or suspicious osseous lesion. Sinuses/Orbits: Visualized paranasal sinuses and mastoid air cells are clear. Bilateral cataract extraction. Other: None. IMPRESSION: 1. No evidence of acute intracranial abnormality. 2. Moderate chronic small vessel ischemic disease. Electronically Signed   By: Logan Bores M.D.   On: 11/06/2022 14:03    Impression/Plan:    1) Forehead skin cancer Status: Very pleased to see patient is healing well from radiation on clinical exam today. Continue Vit E oil or cream/lotion twice a day for two more months. Continue to stay in touch with Dermatology for routine skin checks.   2)  Eyelid drooping: Drooping of his upper eyelid continues to improve. Dr. Winifred Olive continues to follow and is scheduled to see him in 1 month.  3) It was a pleasure participating in this patient's care. Follow up with radiation oncology is no longer indicated. The family/patient knows he can always call with any issues or questions.   On date of service, in total, I spent 20 minutes on this encounter. Patient was seen in person. _____________________________________   Leona Singleton, PA   Eppie Gibson, MD

## 2022-11-13 ENCOUNTER — Other Ambulatory Visit: Payer: Medicare Other

## 2022-11-13 ENCOUNTER — Ambulatory Visit: Payer: Medicare Other | Admitting: Urology

## 2022-11-13 ENCOUNTER — Other Ambulatory Visit: Payer: Self-pay

## 2022-11-13 ENCOUNTER — Encounter: Payer: Self-pay | Admitting: Urology

## 2022-11-13 VITALS — BP 105/61 | HR 76 | Ht 73.0 in | Wt 215.0 lb

## 2022-11-13 VITALS — BP 130/70 | HR 58 | Temp 97.4°F

## 2022-11-13 DIAGNOSIS — N401 Enlarged prostate with lower urinary tract symptoms: Secondary | ICD-10-CM

## 2022-11-13 DIAGNOSIS — Z515 Encounter for palliative care: Secondary | ICD-10-CM

## 2022-11-13 MED ORDER — SILODOSIN 4 MG PO CAPS: 4.0000 mg | ORAL_CAPSULE | Freq: Every day | ORAL | 3 refills | Status: DC

## 2022-11-13 NOTE — Progress Notes (Signed)
PATIENT NAME: Leroy [name] DOB: Sep 28, 1930 MRN: [id number]  PRIMARY CARE PROVIDER: Idelle Crouch, MD  RESPONSIBLE PARTY:  Acct ID - Guarantor Home Phone Work Phone Relationship Acct Type  [id number] [name], [name] [phone]  Self P/F     [address] [place], [place], [place] [address]   Appetite:  remains good per daughter [name].  Patient enjoys drinking Pepsi.  No recent weight loss noted by daughter. No swallowing issues reported.  Dementia:  Patient engaging for short periods during conversation.  He is having difficulty finding words.  Pleasant and cooperative.  Wife reports assisting more with grooming and hygiene.  She is laying clothes out and prompting patient with cues.  He is now using a rolling walker for safe ambulation.  No recent falls.  Syncopal Episode:  Patient was seen in the ED last week for a syncopal spell.  Work up completed per daughter but nothing abnormal noted by ED provider.  Daughter received EKG report that has complete heart block noted.  I reviewed EKG from ED visit and note LBBB interpretation.   Daughter will send results to PCP for follow up.  They will also be seeing PCP next Wednesday.   CODE STATUS: DNR ADVANCED DIRECTIVES: N MOST FORM: No PPS: 50%   PHYSICAL EXAM:   VITALS: Today's Vitals   11/13/22 1047  BP: 130/70  Pulse: (!) 58  Temp: (!) 97.4 F (36.3 C)  SpO2: 97%    LUNGS: clear to auscultation  CARDIAC: Cor RRR}  EXTREMITIES: - for edema SKIN: Skin color, texture, turgor normal. No rashes or lesions or Redness to forehead from recent skin cancer treatment.   NEURO: positive for gait problems and memory problems       Lorenza Burton, RN

## 2022-11-13 NOTE — Progress Notes (Signed)
11/13/2022 12:12 PM   Leroy [name] November 19, 1929 [id number]  Referring provider: Idelle Crouch, MD [facility] [place],   [address]  Chief Complaint  Patient presents with   Benign Prostatic Hypertrophy    HPI: 89 y.o. male presents for annual follow-up.  Refer to my prior note 11/20/2021 He has tolerated silodosin 4 mg without orthostatic symptoms and noted significant improvement in his lower urinary tract symptoms. No complaints today   PMH: Past Medical History:  Diagnosis Date   Anemia    Bursitis    Cancer ([place])    skin cancer face   Chronic kidney disease    Dementia (HCC)    Dyspnea    GI bleed    History of hiatal hernia    Hyperlipidemia    Hypertension    IVCD (intraventricular conduction defect)    Leg weakness, bilateral    Obesity    Peripheral vascular disease (HCC)    SCC (squamous cell carcinoma) 11/20/2021   right cheek, Mohs completed 11/24/21   Stomach ulcer    Stroke ([place])    feb 2017, weakness in legs,    Tremor    Vitamin B 12 deficiency    Vitamin D deficiency     Surgical History: Past Surgical History:  Procedure Laterality Date   BACK SURGERY     BIOPSY  03/05/2021   Procedure: BIOPSY;  Surgeon: Irving Copas., MD;  Location: [place];  Service: Gastroenterology;;   CHOLECYSTECTOMY N/A 03/03/2021   Procedure: LAPAROSCOPIC CHOLECYSTECTOMY WITH INTRAOPERATIVE CHOLANGIOGRAM;  Surgeon: Jesusita Oka, MD;  Location: [place];  Service: General;  Laterality: N/A;   ENDOSCOPIC RETROGRADE CHOLANGIOPANCREATOGRAPHY (ERCP) WITH PROPOFOL N/A 03/05/2021   Procedure: ENDOSCOPIC RETROGRADE CHOLANGIOPANCREATOGRAPHY (ERCP) WITH PROPOFOL;  Surgeon: Irving Copas., MD;  Location: [place];  Service: Gastroenterology;  Laterality: N/A;   ESOPHAGOGASTRODUODENOSCOPY (EGD) WITH PROPOFOL N/A 11/09/2018   Procedure: ESOPHAGOGASTRODUODENOSCOPY (EGD) WITH PROPOFOL;  Surgeon: Manya Silvas, MD;   Location: [facility];  Service: Endoscopy;  Laterality: N/A;   EYE SURGERY      bilateral cataracts   HERNIA REPAIR     inguinal on left   INGUINAL HERNIA REPAIR Right 12/29/2017   Procedure: HERNIA REPAIR INGUINAL ADULT;  Surgeon: Herbert Pun, MD;  Location: [facility];  Service: General;  Laterality: Right;   INSERTION OF MESH Right 12/29/2017   Procedure: INSERTION OF MESH;  Surgeon: Herbert Pun, MD;  Location: [facility];  Service: General;  Laterality: Right;   LUMBAR LAMINECTOMY/DECOMPRESSION MICRODISCECTOMY N/A 08/12/2016   Procedure: LAMINECTOMY AND FORAMINOTOMY LUMBAR FOUR - LUMBAR FIVE;  Surgeon: Newman Pies, MD;  Location: [place];  Service: Neurosurgery;  Laterality: N/A;  LAMINECTOMY AND FORAMINOTOMY L4-L5   REMOVAL OF STONES  03/05/2021   Procedure: REMOVAL OF STONES;  Surgeon: Rush Landmark Telford Nab., MD;  Location: [place];  Service: Gastroenterology;;   [name]  03/05/2021   Procedure: [name];  Surgeon: Mansouraty, Telford Nab., MD;  Location: [place];  Service: Gastroenterology;;    Home Medications:  Allergies as of 11/13/2022       Reactions   Aspirin Other (See Comments)   He has bleeding ulcers        Medication List        Accurate as of November 13, 2022 12:12 PM. If you have any questions, ask your nurse or doctor.          cholecalciferol 1000 units tablet Commonly known as: VITAMIN D Take 2,000  Units by mouth daily.   clopidogrel 75 MG tablet Commonly known as: PLAVIX Take 1 tablet (75 mg total) by mouth daily.   cyanocobalamin 1000 MCG/ML injection Commonly known as: VITAMIN B12 Inject 1,000 mcg into the muscle every 30 (thirty) days.   DSS 100 MG Caps Take by mouth.   ezetimibe 10 MG tablet Commonly known as: ZETIA Take 10 mg by mouth at bedtime.   isosorbide mononitrate 30 MG 24 hr tablet Commonly known as: IMDUR Take by mouth.   levothyroxine 50 MCG tablet Commonly known as:  SYNTHROID Take 50 mcg by mouth daily.   metoprolol succinate 25 MG 24 hr tablet Commonly known as: TOPROL-XL Take 12.5 mg by mouth in the morning.   mupirocin ointment 2 % Commonly known as: BACTROBAN Apply 1 application topically as directed. Qd to bid to aa face   pantoprazole 40 MG tablet Commonly known as: PROTONIX Take 1 tablet (40 mg total) by mouth 2 (two) times daily before a meal for 30 days, THEN 1 tablet (40 mg total) daily. Start taking on: Mar 06, 2021   pantoprazole 40 MG tablet Commonly known as: PROTONIX Take by mouth.   silodosin 4 MG Caps capsule Commonly known as: RAPAFLO TAKE 1 CAPSULE BY MOUTH ONCE DAILY WITH BREAKFAST   sulfamethoxazole-trimethoprim 800-160 MG tablet Commonly known as: BACTRIM DS Take 1 tablet by mouth 2 (two) times daily for 5 days.        Allergies:  Allergies  Allergen Reactions   Aspirin Other (See Comments)    He has bleeding ulcers    Family History: Family History  Problem Relation Age of Onset   CAD Mother    Cancer Father     Social History:  reports that he has never smoked. His smokeless tobacco use includes chew. He reports that he does not drink alcohol and does not use drugs.   Physical Exam: BP 105/61   Pulse 76   Ht '6\' 1"'$  (1.854 m)   Wt 215 lb (97.5 kg)   BMI 28.37 kg/m   Constitutional:  Alert, No acute distress. HEENT: [place] AT, moist mucus membranes.  Trachea midline, no masses. Cardiovascular: No clubbing, cyanosis, or edema. Respiratory: Normal respiratory effort, no increased work of breathing.  Laboratory Data:  Urinalysis Appearance-yellow, clear Dipstick-negative Microscopy-negative   Assessment & Plan:    1.  BPH with LUTS Doing well on silodosin 4 mg daily without side effects Rx refilled If Dr. Doy Hutching will take over prescribing of silodosin will see him back as needed    Abbie Sons, [place] [address], [place], [place]  [address] [phone]

## 2022-11-16 ENCOUNTER — Emergency Department
Admission: EM | Admit: 2022-11-16 | Discharge: 2022-11-16 | Disposition: A | Discharge status: Home / Self Care | Payer: Medicare Other | Attending: Emergency Medicine | Admitting: Emergency Medicine

## 2022-11-16 ENCOUNTER — Emergency Department: Payer: Medicare Other

## 2022-11-16 DIAGNOSIS — R0789 Other chest pain: Secondary | ICD-10-CM | POA: Insufficient documentation

## 2022-11-16 DIAGNOSIS — F0390 Unspecified dementia without behavioral disturbance: Secondary | ICD-10-CM | POA: Insufficient documentation

## 2022-11-16 DIAGNOSIS — R079 Chest pain, unspecified: Secondary | ICD-10-CM

## 2022-11-16 LAB — CBC
HCT: 29.8 % — ABNORMAL LOW (ref 39.0–52.0)
Hemoglobin: 9.5 g/dL — ABNORMAL LOW (ref 13.0–17.0)
MCH: 28 pg (ref 26.0–34.0)
MCHC: 31.9 g/dL (ref 30.0–36.0)
MCV: 87.9 fL (ref 80.0–100.0)
Platelets: 276 10*3/uL (ref 150–400)
RBC: 3.39 MIL/uL — ABNORMAL LOW (ref 4.22–5.81)
RDW: 14 % (ref 11.5–15.5)
WBC: 6.6 10*3/uL (ref 4.0–10.5)
nRBC: 0 % (ref 0.0–0.2)

## 2022-11-16 LAB — BASIC METABOLIC PANEL
Anion gap: 8 (ref 5–15)
BUN: 22 mg/dL (ref 8–23)
CO2: 25 mmol/L (ref 22–32)
Calcium: 9.1 mg/dL (ref 8.9–10.3)
Chloride: 105 mmol/L (ref 98–111)
Creatinine, Ser: 1.47 mg/dL — ABNORMAL HIGH (ref 0.61–1.24)
GFR, Estimated: 45 mL/min — ABNORMAL LOW (ref >60)
Glucose, Bld: 105 mg/dL — ABNORMAL HIGH (ref 70–99)
Potassium: 3.5 mmol/L (ref 3.5–5.1)
Sodium: 138 mmol/L (ref 135–145)

## 2022-11-16 LAB — HEPATIC FUNCTION PANEL
ALT: 11 U/L (ref 0–44)
AST: 20 U/L (ref 15–41)
Albumin: 3.3 g/dL — ABNORMAL LOW (ref 3.5–5.0)
Alkaline Phosphatase: 62 U/L (ref 38–126)
Bilirubin, Direct: <0.1 mg/dL (ref 0.0–0.2)
Total Bilirubin: 0.4 mg/dL (ref 0.3–1.2)
Total Protein: 6.6 g/dL (ref 6.5–8.1)

## 2022-11-16 LAB — LIPASE, BLOOD: Lipase: 38 U/L (ref 11–51)

## 2022-11-16 LAB — TROPONIN I (HIGH SENSITIVITY)
Troponin I (High Sensitivity): 15 ng/L (ref <18)
Troponin I (High Sensitivity): 14 ng/L (ref <18)

## 2022-11-16 MED ORDER — IOHEXOL 350 MG/ML SOLN
60.0000 mL | Freq: Once | INTRAVENOUS | Status: AC | PRN
  Administered 2022-11-16: 60 mL via INTRAVENOUS

## 2022-11-16 NOTE — ED Triage Notes (Signed)
Pt here with his daughter. Pt has Dementia. Pt is complaining of pain under the left side of his rib cage. Per daughter pt has had pancreatitis previously and was admitted for it.

## 2022-11-16 NOTE — Discharge Instructions (Signed)
Follow up with PCP.  Return for any additional questions or concerns. °

## 2022-11-16 NOTE — ED Provider Notes (Signed)
[facility] Provider Note    Event Date/Time   First MD Initiated Contact with Patient 11/16/22 1402     (approximate)   History   Chest Pain   HPI  Leroy [name] is a 89 y.o. male  who presents to the emergency department today because of concern for left lower chest pain. Most history is obtained by family at bedside given patient's history of dementia.  Apparently the patient had been complaining of left lower chest pain for roughly 1 to 2 hours this morning.  At the time my exam she denies any further pain.  Patient barely has history of pancreatitis and it was on the left side although slightly lower.  Additionally the daughter is concerned because she has noticed multiple episodes where the patient seems to be staring into space.  He has had strokes in the past.  She is also concerned that he might be having a seizure.     Physical Exam   Triage Vital Signs: ED Triage Vitals [11/16/22 1139]  Enc Vitals Group     BP 138/86     Pulse Rate 77     Resp 18     Temp (!) 97.4 F (36.3 C)     Temp Source Oral     SpO2 96 %     Weight 215 lb (97.5 kg)     Height      Head Circumference      Peak Flow      Pain Score 4     Pain Loc      Pain Edu?      Excl. in [place]?     Most recent vital signs: Vitals:   11/16/22 1139  BP: 138/86  Pulse: 77  Resp: 18  Temp: (!) 97.4 F (36.3 C)  SpO2: 96%    General: Awake, alert, oriented. CV:  Good peripheral perfusion. Regular rate and rhythm. Resp:  Normal effort. Lungs clear. Abd:  No distention. Non tender.    ED Results / Procedures / Treatments   Labs (all labs ordered are listed, but only abnormal results are displayed) Labs Reviewed  BASIC METABOLIC PANEL - Abnormal; Notable for the following components:      Result Value   Glucose, Bld 105 (*)    Creatinine, Ser 1.47 (*)    GFR, Estimated 45 (*)    All other components within normal limits  CBC - Abnormal; Notable for the following  components:   RBC 3.39 (*)    Hemoglobin 9.5 (*)    HCT 29.8 (*)    All other components within normal limits  HEPATIC FUNCTION PANEL - Abnormal; Notable for the following components:   Albumin 3.3 (*)    All other components within normal limits  LIPASE, BLOOD  TROPONIN I (HIGH SENSITIVITY)  TROPONIN I (HIGH SENSITIVITY)     EKG  I, Nance Pear, attending physician, personally viewed and interpreted this EKG  EKG Time: 1142 Rate: 66 Rhythm: sinus rhythm with 1st degree av block Axis: left axis deviation Intervals: qtc 459 QRS: LVH ST changes: no st elevation Impression: abnormal ekg    RADIOLOGY I independently interpreted and visualized the CXR. My interpretation: No pneumonia Radiology interpretation:  IMPRESSION:  1. Ill-defined nodularity in both lung apices, cannot exclude  pulmonary nodules although sclerotic bone lesions might have a  similar appearance. CT of the chest is recommended for further  characterization.  2. Linear/bandlike densities in the right mid lung, right  lung base,  and lingula probably from atelectasis or scarring.      PROCEDURES:  Critical Care performed: No  Procedures   MEDICATIONS ORDERED IN ED: Medications - No data to display   IMPRESSION / MDM / [place] / ED COURSE  I reviewed the triage vital signs and the nursing notes.                              Differential diagnosis includes, but is not limited to, ACS, PE, stroke.  Patient's presentation is most consistent with acute presentation with potential threat to life or bodily function.  Patient presents to the emergency department accompanied by family because of episode of left lower chest pain.  Family also is concerned about staring episodes that have occurred a couple of times over the past few weeks.  Chest x-ray was concerning for possible pulmonary nodules.  Will get a CT chest to further evaluate.  Additionally given patient's family concern  for possible stroke will obtain MRI.  Advanced imaging pending at time of signout.     FINAL CLINICAL IMPRESSION(S) / ED DIAGNOSES   Left lower chest pain   Note:  This document was prepared using Dragon voice recognition software and may include unintentional dictation errors.    Nance Pear, MD 11/16/22 [phone]

## 2022-11-16 NOTE — ED Notes (Signed)
Pt denies chest pain at this time.

## 2022-11-16 NOTE — ED Provider Notes (Signed)
Patient received in signout from Dr. Archie Balboa pending follow-up imaging, labs as well as MRI.  Patient reassessed he is well-appearing he denies any pain or discomfort.  CTA is without acute abnormality.  MRI without evidence of acute stroke.  He is not having any symptoms of CVA.  He is pain-free.  Serial enzymes are negative.  LFTs normal lipase normal and his abdominal exam is soft and benign.  No leukocytosis.  Does appear stable and appropriate for further workup as an outpatient as he is currently pain-free.  Discussed return precautions.   Merlyn Lot, MD 11/16/22 [phone]

## 2022-11-17 NOTE — Telephone Encounter (Signed)
Error

## 2022-12-08 NOTE — Progress Notes (Signed)
 Established Patient Visit   Chief Complaint: Chief Complaint  Patient presents with  . Hypertension  . Abnormal ECG    Recently at [facility]   Date of Service: 12/08/2022 Date of Birth: 09/21/1930 PCP: Leroy Reyes BIRCH, MD  History of Present Illness: Leroy [name] is a 89 y.o.male patient who returns for essential hypertension, hyperlipidemia,history of hemorrhagic stroke, transient ischemic attack and chest pain with shortness of breath.  The patient has been lost to follow-up since 08/22/2020.  Patient experienced a transient ischemic attack 03/2020 with transient blindness.    Prior 2D echocardiogram 12/22/2015 revealed normal left ventricular function with LVEF of 55-60%.  Prior Lexiscan sestamibi study was performed 07/17/2016.  Gated scintigraphy revealed LV ejection fraction 57%.  SPECT imaging revealed a mild fixed apical wall defect most consistent with soft tissue attenuation versus scar.  There was no evidence for significant ischemia.  Repeat 2D echocardiogram 08/16/2020 revealed normal left ventricular function, with LVEF greater than 55%.  Lexiscan Myoview was performed 08/07/2020.  Gated scintigraphy revealed LV ejection fraction of 51%.  SPECT analysis did not reveal evidence for scar or ischemia.  Was admitted 11/16/2022 for left-sided chest pain, with atypical features, ECG revealing sinus rhythm at 66 bpm with left ventricular hypertrophy.  Chest CT did not reveal evidence for pulmonary embolus.  The patient returns today for follow-up, reports doing good.  He denies chest pain.  Chronic exertional dyspnea.  He denies palpitations or heart racing.  He has mild peripheral edema.  The patient is not very active and not exercising regularly, limited by worsening dementia.  The patient has essential hypertension, blood pressure well controlled on metoprolol  succinate, which is well tolerated without apparent side effects.  The patient follows a low-sodium, no added salt diet.  The patient has  hyperlipidemia, diet controlled.  The patient follows a low-cholesterol, low-fat diet.  Past Medical and Surgical History  Past Medical History Past Medical History:  Diagnosis Date  . B12 deficiency   . Benign hypertension   . Cerebral hemorrhage (CMS-HCC)   . Constipation due to slow transit 10/06/2018  . DDD (degenerative disc disease)   . Dementia (CMS-HCC)   . Hemorrhagic stroke (CMS-HCC)   . Hiatal hernia   . Hyperlipidemia   . Neuropathy   . Obesity   . Obesity   . Schatzki's ring 12/02/2018  . Squamous cell carcinoma, face   . Tremor   . Upper GI bleed    w/ peptic ulcer disease    Past Surgical History He has a past surgical history that includes Lumbar laminectomy; Kidney stones (1990s); Posterolateral fusion L3-4, posterolateral fusion L4-5, lumbar decompression; Spine surgery; Inguinal hernia repair (Left, 1980s); Inguinal hernia repair (Right, 12/29/2017); egd (11/09/2018); and Cholecystectomy open (03/03/2021).   Medications and Allergies  Current Medications  Current Outpatient Medications  Medication Sig Dispense Refill  . cholecalciferol (VITAMIN D3) 1000 unit capsule Take 1,000 Units by mouth once daily Take tablets 2000U daily.    . clobetasoL (TEMOVATE) 0.05 % ointment Apply topically 2 (two) times daily 30 g 0  . clopidogreL  (PLAVIX ) 75 mg tablet Take 1 tablet (75 mg total) by mouth once daily 90 tablet 3  . docusate (COLACE) 100 MG capsule Take 100 mg by mouth 2 (two) times daily    . ezetimibe (ZETIA) 10 mg tablet Take 1 tablet (10 mg total) by mouth once daily 90 tablet 1  . isosorbide  mononitrate (IMDUR ) 30 MG ER tablet Take 1 tablet (30 mg total) by mouth once  daily 90 tablet 1  . levothyroxine  (SYNTHROID ) 50 MCG tablet Take 1 tablet (50 mcg total) by mouth once daily Take on an empty stomach with a glass of water at least 30-60 minutes before breakfast. 90 tablet 3  . metoprolol  succinate (TOPROL -XL) 25 MG XL tablet Take 0.5 tablets (12.5 mg total)  by mouth once daily 45 tablet 3  . pantoprazole  (PROTONIX ) 40 MG DR tablet Take 1 tablet (40 mg total) by mouth once daily 90 tablet 3  . silodosin  4 mg Cap Take 1 capsule (4 mg total) by mouth once daily 90 capsule 3   Current Facility-Administered Medications  Medication Dose Route Frequency Provider Last Rate Last Admin  . cyanocobalamin (VITAMIN B12) injection 1,000 mcg  1,000 mcg Intramuscular Q30 Days Leroy Reyes BIRCH, MD   1,000 mcg at 11/23/22 1036    Allergies: Aspirin  and Codeine  Social and Family History  Social History  reports that he has never smoked. His smokeless tobacco use includes chew. He reports that he does not drink alcohol and does not use drugs.  Family History Family History  Problem Relation Age of Onset  . Myocardial Infarction (Heart attack) Mother   . Cancer Father     Review of Systems   Review of Systems: The patient denies chest pain, reports chronic exertional shortness of breath, without orthopnea, paroxysmal nocturnal dyspnea, pedal edema, palpitations, heart racing, presyncope, syncope. Review of 12 Systems is negative except as described above.  Physical Examination   Vitals:BP 112/64   Pulse 74   Ht 182.9 cm (6')   Wt 96.6 kg (213 lb)   SpO2 96%   BMI 28.89 kg/m  Ht:182.9 cm (6') Wt:96.6 kg (213 lb) ADJ:Anib surface area is 2.22 meters squared. Body mass index is 28.89 kg/m.  HEENT: Pupils equally reactive to light and accomodation  Neck: Supple without thyromegaly, carotid pulses 2+ Lungs: clear to auscultation bilaterally; no wheezes, rales, rhonchi Heart: Regular rate and rhythm.  No gallops, murmurs or rub Abdomen: soft nontender, nondistended, with normal bowel sounds Extremities: no cyanosis, clubbing, or edema Peripheral Pulses: 2+ in all extremities, 2+ femoral pulses bilaterally  Assessment   89 y.o. male with  1. Hypertensive emergency   2. IVCD (intraventricular conduction defect)   3. PVD (peripheral vascular  disease) (CMS-HCC)   4. Chest pain with high risk for cardiac etiology   5. SOB (shortness of breath) on exertion   6. Pure hypercholesterolemia   7. Acute intracerebral hemorrhage (CMS-HCC)   8. 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 Stage 3 chronic kidney disease, unspecified whether stage 3a or 3b CKD (CMS-HCC)    89 year old male with multiple cardiovascular risk factors, with history of hemorrhagic stroke and  TIA, with history of exertional angina with shortness of breath.  Patient has essential hypertension, blood pressure well controlled on current BP medications.  2D echocardiogram 11/17/2019 revealed normal left ventricular function.  Lexiscan Myoview 08/07/2020 did not reveal evidence for scar or ischemia.  Patient recently seen at [facility] ED 11/16/2022 with left-sided chest pain with atypical features.  ECG revealed sinus rhythm with LVH.  Patient currently chest pain-free.    Plan   1.  Continue current medications 2.  Constipation about low-sodium diet 3.  DASH diet printed instructions given to patient 4.  Counseled patient about low-cholesterol diet 5.  Continue atorvastatin  for hyperlipidemia management  6.  Low-fat and cholesterol diet printed instructions given to the patient 7.  Defer further  cardiac diagnostics at this time 8.  Return to clinic for follow-up in 4 months  No orders of the defined types were placed in this encounter.   Return in about 4 months (around 04/08/2023).  MARSA DOOMS, MD PhD [facility]

## 2022-12-15 ENCOUNTER — Other Ambulatory Visit: Payer: Medicare Other

## 2022-12-15 VITALS — BP 140/72 | HR 67 | Temp 97.5°F

## 2022-12-15 DIAGNOSIS — Z515 Encounter for palliative care: Secondary | ICD-10-CM

## 2022-12-15 NOTE — Progress Notes (Unsigned)
PATIENT NAME: Leroy [name] DOB: Apr 21, 1930 MRN: [id number]  PRIMARY CARE PROVIDER: Idelle Crouch, MD  RESPONSIBLE PARTY:  Acct ID - Guarantor Home Phone Work Phone Relationship Acct Type  [id number] ABDIHAFID, [phone]  Self P/F     [address] [place], [place], [place] [address]   Home visit completed with patient, wife-Sophia and daughter [name].  Dementia:  Wife reports more incontinent spells with bowels.  Continues to wear depends and toilets himself when able.  No changes in appetite.  Still able to feed self.  Patient engages in conversation but has difficulty finding words and at times conversation is rambling.   Update:  Daughter provided an update on recent cardiology and neurology visits.  No further workup for cardiology.  Neurology will continue to follow.  No new medication changes.  Keppra stopped due to increase sedation.    Weakness:  Wife advised patient had an episode yesterday where legs became weak.  He fell backwards onto the bed without any injury.  Patient had difficulty describing pain but this lasted about 20 minutes and then patient was back to baseline.  We discussed the use of his walker and possible a rollator for safety.     CODE STATUS: DNR ADVANCED DIRECTIVES: Yes MOST FORM: No PPS: 50%   PHYSICAL EXAM:   VITALS: Today's Vitals   12/15/22 1125  BP: (!) 140/72  Pulse: 67  Temp: (!) 97.5 F (36.4 C)  SpO2: 95%    LUNGS: clear to auscultation  CARDIAC: Cor RRR}  EXTREMITIES: trace edema SKIN: Skin color, texture, turgor normal. No rashes or lesions or normal  NEURO: positive for memory problems       Lorenza Burton, RN

## 2023-01-13 ENCOUNTER — Other Ambulatory Visit: Payer: Medicare Other

## 2023-01-13 VITALS — BP 144/72 | HR 57 | Temp 97.7°F

## 2023-01-13 DIAGNOSIS — Z515 Encounter for palliative care: Secondary | ICD-10-CM

## 2023-01-13 NOTE — Progress Notes (Addendum)
PATIENT NAME: Leroy [name] DOB: 04-28-1930 MRN: [id number]  PRIMARY CARE PROVIDER: Idelle Crouch, MD  RESPONSIBLE PARTY:  Acct ID - Guarantor Home Phone Work Phone Relationship Acct Type  [id number] Leroy [name], Leroy [name]  Self P/F     [address] [place], [place], [place] [address]    Home visit completed patient, wife and daughter Leroy [name].  ACP:  Daughter advised they will no longer seek ED/hospitalization visits.  Focus on comfort and quality of life.  Will refer to hospice when appropriate.  DNR in place.   Alzheimer's:  Becoming more dependent on wife.  She is providing more cues and assisting with follow through with tasks.  Using the walker more with family encouragement.   Napping for 10-15 minutes 2-3 x daily.   Sleeping for 12-13 hours a night.   Appetite remains good and patient is able to feed himself.  Wearing depends for occasional urinary incontinence.  More frequent during the night.  Occasional bowel incontinence. Chewing medications now.  Started this week.  Tried meds whole in apple sauce but still chewing.  Education provided on disease progression.  Daughter shares some behavioral issues are starting to arise.  Patient most recently banging on the pipes in the basement.  Conversation is rambling but occasionally is able to answer questions.  Fall:  Daughter reported patient fell in the yard about a week ago.  No injuries reported.   Resources:  Provided Dementia Resource Book to daughter Leroy [name].   Follow up visit scheduled for 03/04/23 @ 11 am.    CODE STATUS: DNR ADVANCED DIRECTIVES: Yes MOST FORM: No PPS: 40%   PHYSICAL EXAM:   VITALS: Today's Vitals   01/13/23 1121  BP: (!) 144/72  Pulse: (!) 57  Temp: 97.7 F (36.5 C)  SpO2: 95%    LUNGS: clear to auscultation  CARDIAC: Cor RRR}  EXTREMITIES: trace edema SKIN: Skin color, texture, turgor normal. No rashes or lesions or mobility and turgor normal  NEURO: positive for gait problems and  memory problems       Lorenza Burton, RN

## 2023-03-04 ENCOUNTER — Other Ambulatory Visit: Payer: Medicare Other

## 2023-03-04 VITALS — BP 140/76 | HR 69 | Temp 97.6°F

## 2023-03-04 DIAGNOSIS — Z515 Encounter for palliative care: Secondary | ICD-10-CM

## 2023-03-04 NOTE — Progress Notes (Signed)
PATIENT NAME: Leroy [name] DOB: Aug 24, 1930 MRN: [id number]  PRIMARY CARE PROVIDER: Marguarite Arbour, MD  RESPONSIBLE PARTY:  Acct ID - Guarantor Home Phone Work Phone Relationship Acct Type  [id number] ETHERIDGE, GEIL* [phone]  Self P/F     [address], [place], [place] [address]   Home visit completed with patient and daughter [name].  Dementia:   Patient remains ambulatory.  Has a rolling walker but mostly using outside of the home. No falls reported.  Mostly incontinent of urine and wearing depends.  Patient is having more trouble with conversation able to answer some questions appropriately.  Rambling from topic to topic and difficulty finding words.  Daughter shares patient urinated in the shower thinking it was the toilet.  Occasionally will get frustrated with wife.  Continues eating well and still feeding himself.    Patient will see PCP this month and requested follow up visit for the end of next month.    CODE STATUS: DNR ADVANCED DIRECTIVES: Yes MOST FORM: No PPS: 40%   PHYSICAL EXAM:   VITALS: Today's Vitals   03/04/23 1108  BP: (!) 140/76  Pulse: 69  Temp: 97.6 F (36.4 C)  SpO2: 96%    LUNGS: clear to auscultation  CARDIAC: Cor RRR}  EXTREMITIES: nonpitting bilateral lower extremity edema SKIN: Skin color, texture, turgor normal. No rashes or lesions or left lateral leg with flat,circle with redness present.  Daughter will try antifungal to area.    NEURO: positive for memory problems       Truitt Merle, RN

## 2023-04-28 ENCOUNTER — Other Ambulatory Visit: Payer: Medicare Other

## 2023-12-29 NOTE — Progress Notes (Signed)
 Leroy [name] is a  89 y.o. male who presents for  CHIEF COMPLAINT Chief Complaint  Patient presents with  . Annual Exam    Physical     Subjective: History of Present Illness  Pt in NAD. Here for yearly eval. HTN stable on meds. Has HLD not on statin, thyroid dz on Synthroid  and B12 def on shots. Also with dementia and CKD. Weight stable. Recent labs stable except TSH elevated. Sleeping well. Eating well. No fever. Denies CP or SOB. No palpitations. Some irritability   Past Medical History:  Diagnosis Date  . B12 deficiency   . Benign hypertension   . Cerebral hemorrhage (CMS/HHS-HCC)   . Constipation due to slow transit 10/06/2018  . DDD (degenerative disc disease)   . Dementia (CMS/HHS-HCC)   . Hemorrhagic stroke (CMS/HHS-HCC)   . Hiatal hernia   . Hyperlipidemia   . Neuropathy   . Obesity   . Obesity   . Schatzki's ring 12/02/2018  . Squamous cell carcinoma, face   . Tremor   . Upper GI bleed    w/ peptic ulcer disease   Patient Active Problem List  Diagnosis  . Benign hypertension  . Hyperlipidemia  . Obesity  . Upper GI bleed  . Hiatal hernia  . Degenerative disc disease, lumbar  . Anemia  . Nontraumatic hemorrhage of left cerebral hemisphere (CMS/HHS-HCC)  . Benign essential tremor  . Leg weakness, bilateral  . Polyneuropathy  . Acute intracerebral hemorrhage (CMS/HHS-HCC)  . Hypertensive emergency  . Nontraumatic intracerebral hemorrhage (CMS/HHS-HCC)  . SOB (shortness of breath) on exertion  . Preop cardiovascular exam  . IVCD (intraventricular conduction defect)  . B12 deficiency  . CKD (chronic kidney disease) stage 3, GFR 30-59 ml/min (CMS-HCC)  . Lumbar stenosis with neurogenic claudication  . Rotator cuff tendinitis, right  . Bursitis of shoulder, right  . Carpal tunnel syndrome, right  . Vitamin D deficiency  . 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 Constipation due to slow transit  . Stool  incontinence  . Schatzki's ring  . PVD (peripheral vascular disease) (CMS-HCC)  . Chest pain with high risk for cardiac etiology  . Statin myopathy  . Acquired hypothyroidism  . Acute cholecystitis  . Acute pancreatitis (HHS-HCC)  . Carotid stenosis  . Choledocholithiasis  . Diarrhea  . Elevated LFTs  . Pain in limb  . Squamous cell carcinoma of forehead    Past Surgical History:  Procedure Laterality Date  . INGUINAL HERNIA REPAIR Right 12/29/2017   Dr Lucas Catchings  . EGD  11/09/2018   Dilated: No repeat per RTE  . CHOLECYSTECTOMY OPEN  03/03/2021   then stones were removed  . INGUINAL HERNIA REPAIR Left 1980s  . Kidney stones  1990s  . Lumbar laminectomy     Degenerative disc disease  . Posterolateral fusion L3-4, posterolateral fusion L4-5, lumbar decompression     central and lateral partial facetectomy and foraminotomy L2-3, lumbar decompression central and lateral facetectomy and foraminotomy L3-4, lumbar decompression and partial facetectomy 06/29/07.   SABRA SPINE SURGERY       Current Outpatient Medications:  .  cholecalciferol (VITAMIN D3) 1000 unit capsule, Take 1,000 Units by mouth once daily Take tablets 2000U daily., Disp: , Rfl:  .  clopidogreL  (PLAVIX ) 75 mg tablet, Take 1 tablet (75 mg total) by mouth once daily, Disp: 90 tablet, Rfl: 3 .  docusate (COLACE) 100 MG capsule, Take 100 mg by mouth 2 (two) times daily, Disp: ,  Rfl:  .  ezetimibe (ZETIA) 10 mg tablet, Take 1 tablet (10 mg total) by mouth once daily, Disp: 90 tablet, Rfl: 3 .  ferrous sulfate 325 (65 FE) MG EC tablet, Take 325 mg by mouth daily with breakfast, Disp: , Rfl:  .  hydrOXYzine HCL (ATARAX) 10 MG tablet, , Disp: , Rfl:  .  isosorbide  mononitrate (IMDUR ) 30 MG ER tablet, Take 1 tablet (30 mg total) by mouth once daily, Disp: 90 tablet, Rfl: 3 .  levothyroxine  (SYNTHROID ) 50 MCG tablet, Take 1 tablet (50 mcg total) by mouth once daily Take on an empty stomach with a glass of water at least  30-60 minutes before breakfast., Disp: 90 tablet, Rfl: 3 .  metoprolol  succinate (TOPROL -XL) 25 MG XL tablet, Take 0.5 tablets (12.5 mg total) by mouth once daily, Disp: 45 tablet, Rfl: 3 .  pantoprazole  (PROTONIX ) 40 MG DR tablet, Take 1 tablet (40 mg total) by mouth once daily, Disp: 90 tablet, Rfl: 3 .  silodosin  4 mg Cap, Take 1 capsule (4 mg total) by mouth once daily, Disp: 90 capsule, Rfl: 3  Aspirin  and Codeine  Social History   Socioeconomic History  . Marital status: Married  . Years of education: GED  . Highest education level: GED or equivalent  Occupational History  . Occupation: retired  Tobacco Use  . Smoking status: Never  . Smokeless tobacco: Current    Types: Chew  Vaping Use  . Vaping status: Never Used  Substance and Sexual Activity  . Alcohol use: No    Alcohol/week: 0.0 standard drinks of alcohol  . Drug use: No  . Sexual activity: Yes   Social Drivers of Corporate investment banker Strain: Low Risk  (12/29/2023)   Overall Financial Resource Strain (CARDIA)   . Difficulty of Paying Living Expenses: Not hard at all  Food Insecurity: No Food Insecurity (12/29/2023)   Hunger Vital Sign   . Worried About Programme researcher, broadcasting/film/video in the Last Year: Never true   . Ran Out of Food in the Last Year: Never true  Transportation Needs: No Transportation Needs (12/29/2023)   PRAPARE - Transportation   . Lack of Transportation (Medical): No   . Lack of Transportation (Non-Medical): No  Housing Stability: Low Risk  (12/29/2023)   Housing Stability Vital Sign   . Unable to Pay for Housing in the Last Year: No   . Number of Times Moved in the Last Year: 0   . Homeless in the Last Year: No    Family History  Problem Relation Name Age of Onset  . Myocardial Infarction (Heart attack) Mother    . Cancer Father      A comprehensive ROS was negative except for HPI  PE: BP 126/74   Pulse 73   Ht 182.9 cm (6')   Wt 97.1 kg (214 lb)   SpO2 97%   BMI 29.02 kg/m   General. Alert oriented x3  Eyes. Sclera and conjunctiva clear; pupils equal round and reactive to light and accommodation; extraocular movements intact Ears. External normal; canals clear; tympanic membranes normal Nose. Mucosa healthy without drainage or ulceration Oropharynx. No suspicious lesions Neck. No swelling, masses, stiffness, pain, limited movement, carotid pulses normal bilaterally, thyroid normal size, no masses palpated.  No bruits Lungs. Respirations unlabored; clear to auscultation bilaterally Back. No spinal deformity Cardiovascular. Heart regular rate and rhythm without murmurs, gallops, or rubs Abdomen. Soft; non tender; non distended; normoactive bowel sounds; no masses or organomegaly Lymph  Nodes. No significant cervical, supraclavicular, axillary or inguinal lymphadenopathy noted Musculoskeletal. No deformities; no active joint inflammation Extremities. Normal, no edema Pulses. Dorsalis pedis palpable and symmetric bilaterally Neurologic. Alert and oriented; speech intact; face symmetrical; moves all extremities well  Appointment on 12/21/2023  Component Date Value Ref Range Status  . Cholesterol, Total 12/21/2023 241 (H)  100 - 200 mg/dL Final  . Triglyceride 97/81/7974 105  35 - 199 mg/dL Final  . HDL (High Density Lipoprotein) Cho* 12/21/2023 67.0  29.0 - 71.0 mg/dL Final  . LDL Calculated 12/21/2023 846 (H)  0 - 130 mg/dL Final  . VLDL Cholesterol 12/21/2023 21  mg/dL Final  . Cholesterol/HDL Ratio 12/21/2023 3.6   Final  . WBC (White Blood Cell Count) 12/21/2023 7.6  4.1 - 10.2 10^3/uL Final  . RBC (Red Blood Cell Count) 12/21/2023 3.91 (L)  4.69 - 6.13 10^6/uL Final  . Hemoglobin 12/21/2023 12.3 (L)  14.1 - 18.1 gm/dL Final  . Hematocrit 97/81/7974 36.5 (L)  40.0 - 52.0 % Final  . MCV (Mean Corpuscular Volume) 12/21/2023 93.4  80.0 - 100.0 fl Final  . MCH (Mean Corpuscular Hemoglobin) 12/21/2023 31.5 (H)  27.0 - 31.2 pg Final  . MCHC (Mean Corpuscular  Hemoglobin * 12/21/2023 33.7  32.0 - 36.0 gm/dL Final  . Platelet Count 12/21/2023 255  150 - 450 10^3/uL Final  . RDW-CV (Red Cell Distribution Widt* 12/21/2023 13.1  11.6 - 14.8 % Final  . MPV (Mean Platelet Volume) 12/21/2023 9.8  9.4 - 12.4 fl Final  . Neutrophils 12/21/2023 4.52  1.50 - 7.80 10^3/uL Final  . Lymphocytes 12/21/2023 2.25  1.00 - 3.60 10^3/uL Final  . Monocytes 12/21/2023 0.59  0.00 - 1.50 10^3/uL Final  . Eosinophils 12/21/2023 0.14  0.00 - 0.55 10^3/uL Final  . Basophils 12/21/2023 0.06  0.00 - 0.09 10^3/uL Final  . Neutrophil % 12/21/2023 59.6  32.0 - 70.0 % Final  . Lymphocyte % 12/21/2023 29.7  10.0 - 50.0 % Final  . Monocyte % 12/21/2023 7.8  4.0 - 13.0 % Final  . Eosinophil % 12/21/2023 1.8  1.0 - 5.0 % Final  . Basophil% 12/21/2023 0.8  0.0 - 2.0 % Final  . Immature Granulocyte % 12/21/2023 0.3  <=0.7 % Final  . Immature Granulocyte Count 12/21/2023 0.02  <=0.06 10^3/L Final  . Glucose 12/21/2023 95  70 - 110 mg/dL Final  . Sodium 97/81/7974 140  136 - 145 mmol/L Final  . Potassium 12/21/2023 4.0  3.6 - 5.1 mmol/L Final  . Chloride 12/21/2023 102  97 - 109 mmol/L Final  . Carbon Dioxide (CO2) 12/21/2023 27.7  22.0 - 32.0 mmol/L Final  . Urea Nitrogen (BUN) 12/21/2023 19  7 - 25 mg/dL Final  . Creatinine 97/81/7974 1.5 (H)  0.7 - 1.3 mg/dL Final  . Glomerular Filtration Rate (eGFR) 12/21/2023 44 (L)  >60 mL/min/1.73sq m Final  . Calcium  12/21/2023 9.3  8.7 - 10.3 mg/dL Final  . AST  97/81/7974 17  8 - 39 U/L Final  . ALT  12/21/2023 10  6 - 57 U/L Final  . Alk Phos (alkaline Phosphatase) 12/21/2023 82  34 - 104 U/L Final  . Albumin 12/21/2023 4.0  3.5 - 4.8 g/dL Final  . Bilirubin, Total 12/21/2023 0.7  0.3 - 1.2 mg/dL Final  . Protein, Total 12/21/2023 6.8  6.1 - 7.9 g/dL Final  . A/G Ratio 97/81/7974 1.4  1.0 - 5.0 gm/dL Final  . Thyroid Stimulating Hormone (TSH) 12/21/2023 7.130 (H)  0.450-5.330 uIU/ml uIU/mL Final  . Color 12/21/2023 Light Yellow   Colorless, Straw, Light Yellow, Yellow, Dark Yellow Final  . Clarity 12/21/2023 Clear  Clear Final  . Specific Gravity 12/21/2023 1.017  1.005 - 1.030 Final  . pH, Urine 12/21/2023 5.5  5.0 - 8.0 Final  . Protein, Urinalysis 12/21/2023 Trace (!)  Negative mg/dL Final  . Glucose, Urinalysis 12/21/2023 Negative  Negative mg/dL Final  . Ketones, Urinalysis 12/21/2023 Negative  Negative mg/dL Final  . Blood, Urinalysis 12/21/2023 1+ (!)  Negative Final  . Nitrite, Urinalysis 12/21/2023 Negative  Negative Final  . Leukocyte Esterase, Urinalysis 12/21/2023 Negative  Negative Final  . Bilirubin, Urinalysis 12/21/2023 Negative  Negative Final  . Urobilinogen, Urinalysis 12/21/2023 0.2  0.2 - 1.0 mg/dL Final  . WBC, UA 97/81/7974 <1  <=5 /hpf Final  . Red Blood Cells, Urinalysis 12/21/2023 3  <=3 /hpf Final  . Bacteria, Urinalysis 12/21/2023 0-5  0 - 5 /hpf Final  . Squamous Epithelial Cells, Urinaly* 12/21/2023 0  /hpf Final  . Hemoglobin A1C 12/21/2023 5.6  4.2 - 5.6 % Final  . Average Blood Glucose (Calc) 12/21/2023 114  mg/dL Final  Office Visit on 12/01/2023  Component Date Value Ref Range Status  . Color 12/02/2023 Light Yellow  Colorless, Straw, Light Yellow, Yellow, Dark Yellow Final  . Clarity 12/02/2023 Clear  Clear Final  . Specific Gravity 12/02/2023 1.016  1.005 - 1.030 Final  . pH, Urine 12/02/2023 5.5  5.0 - 8.0 Final  . Protein, Urinalysis 12/02/2023 Negative  Negative mg/dL Final  . Glucose, Urinalysis 12/02/2023 Negative  Negative mg/dL Final  . Ketones, Urinalysis 12/02/2023 Negative  Negative mg/dL Final  . Blood, Urinalysis 12/02/2023 Trace (!)  Negative Final  . Nitrite, Urinalysis 12/02/2023 Negative  Negative Final  . Leukocyte Esterase, Urinalysis 12/02/2023 Negative  Negative Final  . Bilirubin, Urinalysis 12/02/2023 Negative  Negative Final  . Urobilinogen, Urinalysis 12/02/2023 0.2  0.2 - 1.0 mg/dL Final  . WBC, UA 98/69/7974 <1  <=5 /hpf Final  . Red Blood Cells,  Urinalysis 12/02/2023 3  <=3 /hpf Final  . Bacteria, Urinalysis 12/02/2023 0-5  0 - 5 /hpf Final  . Squamous Epithelial Cells, Urinaly* 12/02/2023 0  /hpf Final  Office Visit on 06/29/2023  Component Date Value Ref Range Status  . WBC (White Blood Cell Count) 06/29/2023 7.8  4.1 - 10.2 10^3/uL Final  . RBC (Red Blood Cell Count) 06/29/2023 3.75 (L)  4.69 - 6.13 10^6/uL Final  . Hemoglobin 06/29/2023 11.1 (L)  14.1 - 18.1 gm/dL Final  . Hematocrit 91/72/7975 33.8 (L)  40.0 - 52.0 % Final  . MCV (Mean Corpuscular Volume) 06/29/2023 90.1  80.0 - 100.0 fl Final  . MCH (Mean Corpuscular Hemoglobin) 06/29/2023 29.6  27.0 - 31.2 pg Final  . MCHC (Mean Corpuscular Hemoglobin * 06/29/2023 32.8  32.0 - 36.0 gm/dL Final  . Platelet Count 06/29/2023 239  150 - 450 10^3/uL Final  . RDW-CV (Red Cell Distribution Widt* 06/29/2023 16.9 (H)  11.6 - 14.8 % Final  . MPV (Mean Platelet Volume) 06/29/2023 10.0  9.4 - 12.4 fl Final  . Neutrophils 06/29/2023 5.03  1.50 - 7.80 10^3/uL Final  . Lymphocytes 06/29/2023 1.95  1.00 - 3.60 10^3/uL Final  . Monocytes 06/29/2023 0.55  0.00 - 1.50 10^3/uL Final  . Eosinophils 06/29/2023 0.15  0.00 - 0.55 10^3/uL Final  . Basophils 06/29/2023 0.05  0.00 - 0.09 10^3/uL Final  . Neutrophil % 06/29/2023 64.9  32.0 - 70.0 %  Final  . Lymphocyte % 06/29/2023 25.2  10.0 - 50.0 % Final  . Monocyte % 06/29/2023 7.1  4.0 - 13.0 % Final  . Eosinophil % 06/29/2023 1.9  1.0 - 5.0 % Final  . Basophil% 06/29/2023 0.6  0.0 - 2.0 % Final  . Immature Granulocyte % 06/29/2023 0.3  <=0.7 % Final  . Immature Granulocyte Count 06/29/2023 0.02  <=0.06 10^3/L Final  . Glucose 06/29/2023 178 (H)  70 - 110 mg/dL Final  . Sodium 91/72/7975 139  136 - 145 mmol/L Final  . Potassium 06/29/2023 3.4 (L)  3.6 - 5.1 mmol/L Final  . Chloride 06/29/2023 103  97 - 109 mmol/L Final  . Carbon Dioxide (CO2) 06/29/2023 28.2  22.0 - 32.0 mmol/L Final  . Urea Nitrogen (BUN) 06/29/2023 17  7 - 25 mg/dL Final   . Creatinine 91/72/7975 1.5 (H)  0.7 - 1.3 mg/dL Final  . Glomerular Filtration Rate (eGFR) 06/29/2023 44 (L)  >60 mL/min/1.73sq m Final  . Calcium  06/29/2023 8.6 (L)  8.7 - 10.3 mg/dL Final  . AST  91/72/7975 18  8 - 39 U/L Final  . ALT  06/29/2023 11  6 - 57 U/L Final  . Alk Phos (alkaline Phosphatase) 06/29/2023 82  34 - 104 U/L Final  . Albumin 06/29/2023 3.8  3.5 - 4.8 g/dL Final  . Bilirubin, Total 06/29/2023 0.4  0.3 - 1.2 mg/dL Final  . Protein, Total 06/29/2023 6.5  6.1 - 7.9 g/dL Final  . A/G Ratio 91/72/7975 1.4  1.0 - 5.0 gm/dL Final  . Thyroid Stimulating Hormone (TSH) 06/29/2023 3.164  0.450-5.330 uIU/ml uIU/mL Final  . Color 06/30/2023 Colorless  Colorless, Straw, Light Yellow, Yellow, Dark Yellow Final  . Clarity 06/30/2023 Clear  Clear Final  . Specific Gravity 06/30/2023 1.007  1.005 - 1.030 Final  . pH, Urine 06/30/2023 5.5  5.0 - 8.0 Final  . Protein, Urinalysis 06/30/2023 Negative  Negative mg/dL Final  . Glucose, Urinalysis 06/30/2023 Negative  Negative mg/dL Final  . Ketones, Urinalysis 06/30/2023 Negative  Negative mg/dL Final  . Blood, Urinalysis 06/30/2023 Trace (!)  Negative Final  . Nitrite, Urinalysis 06/30/2023 Negative  Negative Final  . Leukocyte Esterase, Urinalysis 06/30/2023 Negative  Negative Final  . Bilirubin, Urinalysis 06/30/2023 Negative  Negative Final  . Urobilinogen, Urinalysis 06/30/2023 0.2  0.2 - 1.0 mg/dL Final  . WBC, UA 91/71/7975 <1  <=5 /hpf Final  . Red Blood Cells, Urinalysis 06/30/2023 0  <=3 /hpf Final  . Bacteria, Urinalysis 06/30/2023 0-5  0 - 5 /hpf Final  . Squamous Epithelial Cells, Urinaly* 06/30/2023 0  /hpf Final  Appointment on 05/31/2023  Component Date Value Ref Range Status  . WBC (White Blood Cell Count) 05/31/2023 7.6  4.1 - 10.2 10^3/uL Final  . RBC (Red Blood Cell Count) 05/31/2023 3.57 (L)  4.69 - 6.13 10^6/uL Final  . Hemoglobin 05/31/2023 10.0 (L)  14.1 - 18.1 gm/dL Final  . Hematocrit 92/70/7975 31.4 (L)   40.0 - 52.0 % Final  . MCV (Mean Corpuscular Volume) 05/31/2023 88.0  80.0 - 100.0 fl Final  . MCH (Mean Corpuscular Hemoglobin) 05/31/2023 28.0  27.0 - 31.2 pg Final  . MCHC (Mean Corpuscular Hemoglobin * 05/31/2023 31.8 (L)  32.0 - 36.0 gm/dL Final  . Platelet Count 05/31/2023 267  150 - 450 10^3/uL Final  . RDW-CV (Red Cell Distribution Widt* 05/31/2023 18.1 (H)  11.6 - 14.8 % Final  . MPV (Mean Platelet Volume) 05/31/2023 10.0  9.4 - 12.4 fl  Final  . Neutrophils 05/31/2023 4.68  1.50 - 7.80 10^3/uL Final  . Lymphocytes 05/31/2023 2.00  1.00 - 3.60 10^3/uL Final  . Monocytes 05/31/2023 0.63  0.00 - 1.50 10^3/uL Final  . Eosinophils 05/31/2023 0.17  0.00 - 0.55 10^3/uL Final  . Basophils 05/31/2023 0.05  0.00 - 0.09 10^3/uL Final  . Neutrophil % 05/31/2023 61.9  32.0 - 70.0 % Final  . Lymphocyte % 05/31/2023 26.5  10.0 - 50.0 % Final  . Monocyte % 05/31/2023 8.3  4.0 - 13.0 % Final  . Eosinophil % 05/31/2023 2.3  1.0 - 5.0 % Final  . Basophil% 05/31/2023 0.7  0.0 - 2.0 % Final  . Immature Granulocyte % 05/31/2023 0.3  <=0.7 % Final  . Immature Granulocyte Count 05/31/2023 0.02  <=0.06 10^3/L Final  Appointment on 05/03/2023  Component Date Value Ref Range Status  . WBC (White Blood Cell Count) 05/03/2023 6.5  4.1 - 10.2 10^3/uL Final  . RBC (Red Blood Cell Count) 05/03/2023 3.20 (L)  4.69 - 6.13 10^6/uL Final  . Hemoglobin 05/03/2023 8.7 (L)  14.1 - 18.1 gm/dL Final  . Hematocrit 92/98/7975 27.3 (L)  40.0 - 52.0 % Final  . MCV (Mean Corpuscular Volume) 05/03/2023 85.3  80.0 - 100.0 fl Final  . MCH (Mean Corpuscular Hemoglobin) 05/03/2023 27.2  27.0 - 31.2 pg Final  . MCHC (Mean Corpuscular Hemoglobin * 05/03/2023 31.9 (L)  32.0 - 36.0 gm/dL Final  . Platelet Count 05/03/2023 276  150 - 450 10^3/uL Final  . RDW-CV (Red Cell Distribution Widt* 05/03/2023 15.2 (H)  11.6 - 14.8 % Final  . MPV (Mean Platelet Volume) 05/03/2023 9.6  9.4 - 12.4 fl Final  . Neutrophils 05/03/2023 3.80  1.50  - 7.80 10^3/uL Final  . Lymphocytes 05/03/2023 1.80  1.00 - 3.60 10^3/uL Final  . Monocytes 05/03/2023 0.57  0.00 - 1.50 10^3/uL Final  . Eosinophils 05/03/2023 0.24  0.00 - 0.55 10^3/uL Final  . Basophils 05/03/2023 0.04  0.00 - 0.09 10^3/uL Final  . Neutrophil % 05/03/2023 58.8  32.0 - 70.0 % Final  . Lymphocyte % 05/03/2023 27.8  10.0 - 50.0 % Final  . Monocyte % 05/03/2023 8.8  4.0 - 13.0 % Final  . Eosinophil % 05/03/2023 3.7  1.0 - 5.0 % Final  . Basophil% 05/03/2023 0.6  0.0 - 2.0 % Final  . Immature Granulocyte % 05/03/2023 0.3  <=0.7 % Final  . Immature Granulocyte Count 05/03/2023 0.02  <=0.06 10^3/L Final  . Cholesterol, Total 05/03/2023 225 (H)  100 - 200 mg/dL Final  . Triglyceride 92/98/7975 109  35 - 199 mg/dL Final  . HDL (High Density Lipoprotein) Cho* 05/03/2023 61.2  29.0 - 71.0 mg/dL Final  . LDL Calculated 05/03/2023 857 (H)  0 - 130 mg/dL Final  . VLDL Cholesterol 05/03/2023 22  mg/dL Final  . Cholesterol/HDL Ratio 05/03/2023 3.7   Final  . Glucose 05/03/2023 98  70 - 110 mg/dL Final  . Sodium 92/98/7975 140  136 - 145 mmol/L Final  . Potassium 05/03/2023 4.2  3.6 - 5.1 mmol/L Final  . Chloride 05/03/2023 105  97 - 109 mmol/L Final  . Carbon Dioxide (CO2) 05/03/2023 24.6  22.0 - 32.0 mmol/L Final  . Urea Nitrogen (BUN) 05/03/2023 22  7 - 25 mg/dL Final  . Creatinine 92/98/7975 1.6 (H)  0.7 - 1.3 mg/dL Final  . Glomerular Filtration Rate (eGFR) 05/03/2023 41 (L)  >60 mL/min/1.73sq m Final  . Calcium  05/03/2023 9.0  8.7 -  10.3 mg/dL Final  . AST  92/98/7975 15  8 - 39 U/L Final  . ALT  05/03/2023 7  6 - 57 U/L Final  . Alk Phos (alkaline Phosphatase) 05/03/2023 83  34 - 104 U/L Final  . Albumin 05/03/2023 3.7  3.5 - 4.8 g/dL Final  . Bilirubin, Total 05/03/2023 0.4  0.3 - 1.2 mg/dL Final  . Protein, Total 05/03/2023 6.5  6.1 - 7.9 g/dL Final  . A/G Ratio 92/98/7975 1.3  1.0 - 5.0 gm/dL Final  . Thyroid Stimulating Hormone (TSH) 05/03/2023 5.289  0.450-5.330  uIU/ml uIU/mL Final  . Color 05/03/2023 Light Yellow  Colorless, Straw, Light Yellow, Yellow, Dark Yellow Final  . Clarity 05/03/2023 Clear  Clear Final  . Specific Gravity 05/03/2023 1.013  1.005 - 1.030 Final  . pH, Urine 05/03/2023 5.5  5.0 - 8.0 Final  . Protein, Urinalysis 05/03/2023 Negative  Negative mg/dL Final  . Glucose, Urinalysis 05/03/2023 Negative  Negative mg/dL Final  . Ketones, Urinalysis 05/03/2023 Negative  Negative mg/dL Final  . Blood, Urinalysis 05/03/2023 Negative  Negative Final  . Nitrite, Urinalysis 05/03/2023 Negative  Negative Final  . Leukocyte Esterase, Urinalysis 05/03/2023 Negative  Negative Final  . Hyaline Casts, Urinalysis 05/03/2023 1  /lpf Final  . Bilirubin, Urinalysis 05/03/2023 Negative  Negative Final  . Urobilinogen, Urinalysis 05/03/2023 0.2  0.2 - 1.0 mg/dL Final  . WBC, UA 92/98/7975 <1  <=5 /hpf Final  . Red Blood Cells, Urinalysis 05/03/2023 1  <=3 /hpf Final  . Bacteria, Urinalysis 05/03/2023 0-5  0 - 5 /hpf Final  . Squamous Epithelial Cells, Urinaly* 05/03/2023 0  /hpf Final  . Hemoglobin A1C 05/03/2023 6.1 (H)  4.2 - 5.6 % Final  . Average Blood Glucose (Calc) 05/03/2023 128  mg/dL Final  . Vitamin B12 92/98/7975 613  >300 pg/mL Final  . Vitamin D, 25-Hydroxy - [facility] 05/03/2023 35.1  30.0 - 100.0 ng/mL Final  Office Visit on 04/01/2023  Component Date Value Ref Range Status  . WBC (White Blood Cell Count) 04/01/2023 8.3  4.1 - 10.2 10^3/uL Final  . RBC (Red Blood Cell Count) 04/01/2023 3.34 (L)  4.69 - 6.13 10^6/uL Final  . Hemoglobin 04/01/2023 8.9 (L)  14.1 - 18.1 gm/dL Final  . Hematocrit 94/69/7975 28.7 (L)  40.0 - 52.0 % Final  . MCV (Mean Corpuscular Volume) 04/01/2023 85.9  80.0 - 100.0 fl Final  . MCH (Mean Corpuscular Hemoglobin) 04/01/2023 26.6 (L)  27.0 - 31.2 pg Final  . MCHC (Mean Corpuscular Hemoglobin * 04/01/2023 31.0 (L)  32.0 - 36.0 gm/dL Final  . Platelet Count 04/01/2023 258  150 - 450 10^3/uL Final  . RDW-CV  (Red Cell Distribution Widt* 04/01/2023 14.8  11.6 - 14.8 % Final  . MPV (Mean Platelet Volume) 04/01/2023 9.2 (L)  9.4 - 12.4 fl Final  . Neutrophils 04/01/2023 4.60  1.50 - 7.80 10^3/uL Final  . Lymphocytes 04/01/2023 2.77  1.00 - 3.60 10^3/uL Final  . Monocytes 04/01/2023 0.61  0.00 - 1.50 10^3/uL Final  . Eosinophils 04/01/2023 0.23  0.00 - 0.55 10^3/uL Final  . Basophils 04/01/2023 0.05  0.00 - 0.09 10^3/uL Final  . Neutrophil % 04/01/2023 55.5  32.0 - 70.0 % Final  . Lymphocyte % 04/01/2023 33.5  10.0 - 50.0 % Final  . Monocyte % 04/01/2023 7.4  4.0 - 13.0 % Final  . Eosinophil % 04/01/2023 2.8  1.0 - 5.0 % Final  . Basophil% 04/01/2023 0.6  0.0 - 2.0 % Final  .  Immature Granulocyte % 04/01/2023 0.2  <=0.7 % Final  . Immature Granulocyte Count 04/01/2023 0.02  <=0.06 10^3/L Final  . Glucose 04/01/2023 94  70 - 110 mg/dL Final  . Sodium 94/69/7975 139  136 - 145 mmol/L Final  . Potassium 04/01/2023 3.8  3.6 - 5.1 mmol/L Final  . Chloride 04/01/2023 103  97 - 109 mmol/L Final  . Carbon Dioxide (CO2) 04/01/2023 27.4  22.0 - 32.0 mmol/L Final  . Urea Nitrogen (BUN) 04/01/2023 28 (H)  7 - 25 mg/dL Final  . Creatinine 94/69/7975 1.8 (H)  0.7 - 1.3 mg/dL Final  . Glomerular Filtration Rate (eGFR) 04/01/2023 35 (L)  >60 mL/min/1.73sq m Final  . Calcium  04/01/2023 9.0  8.7 - 10.3 mg/dL Final  . AST  94/69/7975 15  8 - 39 U/L Final  . ALT  04/01/2023 7  6 - 57 U/L Final  . Alk Phos (alkaline Phosphatase) 04/01/2023 82  34 - 104 U/L Final  . Albumin 04/01/2023 3.9  3.5 - 4.8 g/dL Final  . Bilirubin, Total 04/01/2023 0.4  0.3 - 1.2 mg/dL Final  . Protein, Total 04/01/2023 6.8  6.1 - 7.9 g/dL Final  . A/G Ratio 94/69/7975 1.3  1.0 - 5.0 gm/dL Final  . Thyroid Stimulating Hormone (TSH) 04/01/2023 4.083  0.450-5.330 uIU/ml uIU/mL Final  . Color 04/01/2023 Light Yellow  Colorless, Straw, Light Yellow, Yellow, Dark Yellow Final  . Clarity 04/01/2023 Clear  Clear Final  . Specific Gravity  04/01/2023 1.017  1.005 - 1.030 Final  . pH, Urine 04/01/2023 5.0  5.0 - 8.0 Final  . Protein, Urinalysis 04/01/2023 Negative  Negative mg/dL Final  . Glucose, Urinalysis 04/01/2023 Negative  Negative mg/dL Final  . Ketones, Urinalysis 04/01/2023 Negative  Negative mg/dL Final  . Blood, Urinalysis 04/01/2023 Trace (!)  Negative Final  . Nitrite, Urinalysis 04/01/2023 Negative  Negative Final  . Leukocyte Esterase, Urinalysis 04/01/2023 Negative  Negative Final  . Bilirubin, Urinalysis 04/01/2023 Negative  Negative Final  . Urobilinogen, Urinalysis 04/01/2023 0.2  0.2 - 1.0 mg/dL Final  . WBC, UA 94/69/7975 1  <=5 /hpf Final  . Red Blood Cells, Urinalysis 04/01/2023 1  <=3 /hpf Final  . Bacteria, Urinalysis 04/01/2023 0-5  0 - 5 /hpf Final  . Squamous Epithelial Cells, Urinaly* 04/01/2023 0  /hpf Final  Office Visit on 12/31/2022  Component Date Value Ref Range Status  . WBC (White Blood Cell Count) 12/31/2022 9.7  4.1 - 10.2 10^3/uL Final  . RBC (Red Blood Cell Count) 12/31/2022 3.32 (L)  4.69 - 6.13 10^6/uL Final  . Hemoglobin 12/31/2022 9.2 (L)  14.1 - 18.1 gm/dL Final  . Hematocrit 97/70/7975 28.8 (L)  40.0 - 52.0 % Final  . MCV (Mean Corpuscular Volume) 12/31/2022 86.7  80.0 - 100.0 fl Final  . MCH (Mean Corpuscular Hemoglobin) 12/31/2022 27.7  27.0 - 31.2 pg Final  . MCHC (Mean Corpuscular Hemoglobin * 12/31/2022 31.9 (L)  32.0 - 36.0 gm/dL Final  . Platelet Count 12/31/2022 281  150 - 450 10^3/uL Final  . RDW-CV (Red Cell Distribution Widt* 12/31/2022 14.2  11.6 - 14.8 % Final  . MPV (Mean Platelet Volume) 12/31/2022 9.7  9.4 - 12.4 fl Final  . Neutrophils 12/31/2022 6.63  1.50 - 7.80 10^3/uL Final  . Lymphocytes 12/31/2022 2.11  1.00 - 3.60 10^3/uL Final  . Monocytes 12/31/2022 0.69  0.00 - 1.50 10^3/uL Final  . Eosinophils 12/31/2022 0.15  0.00 - 0.55 10^3/uL Final  . Basophils 12/31/2022 0.05  0.00 -  0.09 10^3/uL Final  . Neutrophil % 12/31/2022 68.6  32.0 - 70.0 % Final  .  Lymphocyte % 12/31/2022 21.9  10.0 - 50.0 % Final  . Monocyte % 12/31/2022 7.2  4.0 - 13.0 % Final  . Eosinophil % 12/31/2022 1.6  1.0 - 5.0 % Final  . Basophil% 12/31/2022 0.5  0.0 - 2.0 % Final  . Immature Granulocyte % 12/31/2022 0.2  <=0.7 % Final  . Immature Granulocyte Count 12/31/2022 0.02  <=0.06 10^3/L Final  . Glucose 12/31/2022 110  70 - 110 mg/dL Final  . Sodium 97/70/7975 137  136 - 145 mmol/L Final  . Potassium 12/31/2022 4.0  3.6 - 5.1 mmol/L Final  . Chloride 12/31/2022 102  97 - 109 mmol/L Final  . Carbon Dioxide (CO2) 12/31/2022 28.7  22.0 - 32.0 mmol/L Final  . Urea Nitrogen (BUN) 12/31/2022 23  7 - 25 mg/dL Final  . Creatinine 97/70/7975 1.6 (H)  0.7 - 1.3 mg/dL Final  . Glomerular Filtration Rate (eGFR) 12/31/2022 41 (L)  >60 mL/min/1.73sq m Final  . Calcium  12/31/2022 9.3  8.7 - 10.3 mg/dL Final  . AST  97/70/7975 21  8 - 39 U/L Final  . ALT  12/31/2022 13  6 - 57 U/L Final  . Alk Phos (alkaline Phosphatase) 12/31/2022 96  34 - 104 U/L Final  . Albumin 12/31/2022 3.9  3.5 - 4.8 g/dL Final  . Bilirubin, Total 12/31/2022 0.3  0.3 - 1.2 mg/dL Final  . Protein, Total 12/31/2022 6.8  6.1 - 7.9 g/dL Final  . A/G Ratio 97/70/7975 1.3  1.0 - 5.0 gm/dL Final  . Thyroid Stimulating Hormone (TSH) 12/31/2022 2.946  0.450-5.330 uIU/ml uIU/mL Final  . Color 01/06/2023 Light Yellow  Colorless, Straw, Light Yellow, Yellow, Dark Yellow Final  . Clarity 01/06/2023 Clear  Clear Final  . Specific Gravity 01/06/2023 1.019  1.005 - 1.030 Final  . pH, Urine 01/06/2023 5.0  5.0 - 8.0 Final  . Protein, Urinalysis 01/06/2023 Negative  Negative mg/dL Final  . Glucose, Urinalysis 01/06/2023 Negative  Negative mg/dL Final  . Ketones, Urinalysis 01/06/2023 Negative  Negative mg/dL Final  . Blood, Urinalysis 01/06/2023 Negative  Negative Final  . Nitrite, Urinalysis 01/06/2023 Negative  Negative Final  . Leukocyte Esterase, Urinalysis 01/06/2023 Negative  Negative Final  . Bilirubin,  Urinalysis 01/06/2023 Negative  Negative Final  . Urobilinogen, Urinalysis 01/06/2023 0.2  0.2 - 1.0 mg/dL Final  . WBC, UA 96/93/7975 <1  <=5 /hpf Final  . Red Blood Cells, Urinalysis 01/06/2023 2  <=3 /hpf Final  . Bacteria, Urinalysis 01/06/2023 0-5  0 - 5 /hpf Final  . 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 RTC 3 mo, sooner if needed     Attestation Statement:   I personally performed the service. (TP)  Reyes JONETTA Costa, MD, MD
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
## 2024-04-03 NOTE — Progress Notes (Signed)
 Leroy [name] is a 89 y.o. here for Medicare Wellness Visit  MEDICARE WELLNESS VISIT  Providers Rendering Care 1. Dr. Reyes [name] (PCP) Pt in NAD. HTN stable on meds. Has HLD not on statin due to myopathy, thyroid dz on Synthroid  and dementia on multiple meds. Also with CKD and anemia. Losing weight. Eating OK. Sleeping OK. Some nocturia. No fever or HA's. Denies CP or SOB. No palpitations. No change in bowels. Dementia is worsening.  Functional Assessment (1) Hearing: Demonstrates no difficulty in hearing during normal conversation (2) Risk of Falls: Patient has fallen in the last year, Gait steady with  assistance from walker during walk from waiting area to exam room (3) Home Safety: Patient feels secure in their home, There are operational smoke alarms in multiple areas of the home (4) Activities of Daily Living: Has help with personal grooming and household chores, including cooking, cleaning and laundry. Family Manages Personal finances .  Depression Screening PHQ 2/9 last 3 flowsheet values     09/18/2022   11:34 AM 12/29/2023    1:53 PM 03/21/2024    1:00 PM  PHQ-2/9 Depression Screening   Little interest or pleasure in doing things  0 0  Feeling down, depressed, or hopeless  0 0  Patient Health Questionnaire-2 Score  0 0  (OBSOLETE) Little interest or pleasure in doing things 0    (OBSOLETE) Feeling down, depressed, or hopeless (or irritable for Teens only)? 0    (OBSOLETE) Total Prescreening Score 0    (OBSOLETE) Total Score = 0       Depression Severity and Treatment Recommendations:  0-4= None  5-9= Mild / Treatment: Support, educate to call if worse; return in one month  10-14= Moderate / Treatment: Support, watchful waiting; Antidepressant or Psychotherapy  15-19= Moderately severe / Treatment: Antidepressant OR Psychotherapy  >= 20 = Major depression, severe / Antidepressant AND Psychotherapy   Cognitive Impairment Patient has episodes of loosing things,  being forgetful. Seems oriented to person, place and time.  Responses appear appropriate and timely to this observer.  PREVENTION PLAN  Cardiovascular: FLP assessed Diabetes: A1c or FBG assessed Glaucoma: N/A Hepatitis B (HBV) Vaccine:  Not Applicable Smoking Cessation:  Not Applicable  Other Personalized Health Advice  Encouraged patient to exercise 5 days a week, walking, water aerobics, gentle stretching recommended. Increase dietary intake of fresh fruits and vegetables, reduce red meat to twice a week.  End of Life Counseling Patient has living will in place; POA - ; Full Code  Current Outpatient Medications  Medication Sig Dispense Refill  . brexpiprazole (REXULTI) 0.5 mg tablet Take 1 tablet (0.5 mg total) by mouth once daily for 7 days, THEN 2 tablets (1 mg total) once daily for 7 days. 21 tablet 0  . brexpiprazole (REXULTI) 2 mg tablet Take 1 tablet (2 mg total) by mouth once daily 30 tablet 5  . busPIRone (BUSPAR) 5 MG tablet Take 1 tablet (5 mg total) by mouth 2 (two) times daily 60 tablet 3  . cholecalciferol (VITAMIN D3) 1000 unit capsule Take 1,000 Units by mouth once daily Take tablets 2000U daily.    . clopidogreL  (PLAVIX ) 75 mg tablet Take 1 tablet (75 mg total) by mouth once daily 90 tablet 3  . docusate (COLACE) 100 MG capsule Take 100 mg by mouth 2 (two) times daily    . ezetimibe (ZETIA) 10 mg tablet Take 1 tablet (10 mg total) by mouth once daily 90 tablet 1  . ferrous sulfate 325 (  65 FE) MG EC tablet Take 325 mg by mouth daily with breakfast    . hydrOXYzine HCL (ATARAX) 10 MG tablet     . isosorbide  mononitrate (IMDUR ) 30 MG ER tablet Take 1 tablet (30 mg total) by mouth once daily 90 tablet 3  . levothyroxine  (SYNTHROID ) 75 MCG tablet Take 1 tablet (75 mcg total) by mouth once daily Take on an empty stomach with a glass of water at least 30-60 minutes before breakfast. 90 tablet 3  . metoprolol  succinate (TOPROL -XL) 25 MG XL tablet Take 0.5 tablets (12.5 mg  total) by mouth once daily 45 tablet 3  . pantoprazole  (PROTONIX ) 40 MG DR tablet Take 1 tablet (40 mg total) by mouth once daily 90 tablet 1  . silodosin  4 mg Cap Take 1 capsule (4 mg total) by mouth once daily 90 capsule 3   Current Facility-Administered Medications  Medication Dose Route Frequency Provider Last Rate Last Admin  . cyanocobalamin (VITAMIN B12) injection 1,000 mcg  1,000 mcg Intramuscular Q30 Days Auston Leroy BIRCH, MD   1,000 mcg at 03/28/24 1546    Allergies as of 04/03/2024 - Reviewed 04/03/2024  Allergen Reaction Noted  . Aspirin  Other (See Comments) 12/12/2015  . Codeine Other (See Comments) 04/27/2016    Patient Active Problem List  Diagnosis  . Benign hypertension  . Hyperlipidemia  . Obesity  . Upper GI bleed  . Hiatal hernia  . Degenerative disc disease, lumbar  . Anemia  . Nontraumatic hemorrhage of left cerebral hemisphere (CMS/HHS-HCC)  . Benign essential tremor  . Leg weakness, bilateral  . Polyneuropathy  . Acute intracerebral hemorrhage (CMS/HHS-HCC)  . Hypertensive emergency  . Nontraumatic intracerebral hemorrhage (CMS/HHS-HCC)  . SOB (shortness of breath) on exertion  . Preop cardiovascular exam  . IVCD (intraventricular conduction defect)  . B12 deficiency  . CKD (chronic kidney disease) stage 3, GFR 30-59 ml/min (CMS-HCC)  . Lumbar stenosis with neurogenic claudication  . Rotator cuff tendinitis, right  . Bursitis of shoulder, right  . Carpal tunnel syndrome, right  . Vitamin D deficiency  . 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 Constipation due to slow transit  . Stool incontinence  . Schatzki's ring  . PVD (peripheral vascular disease) ()  . Chest pain with high risk for cardiac etiology  . Statin myopathy  . Acquired hypothyroidism  . Acute cholecystitis  . Acute pancreatitis (HHS-HCC)  . Carotid stenosis  . Choledocholithiasis  . Diarrhea  . Elevated LFTs  . Pain in limb  .  Squamous cell carcinoma of forehead    Past Medical History:  Diagnosis Date  . B12 deficiency   . Benign hypertension   . Cerebral hemorrhage (CMS/HHS-HCC)   . Constipation due to slow transit 10/06/2018  . DDD (degenerative disc disease)   . Dementia (CMS/HHS-HCC)   . Hemorrhagic stroke (CMS/HHS-HCC)   . Hiatal hernia   . Hyperlipidemia   . Neuropathy   . Obesity   . Obesity   . Schatzki's ring 12/02/2018  . Squamous cell carcinoma, face   . Tremor   . Upper GI bleed    w/ peptic ulcer disease    Past Surgical History:  Procedure Laterality Date  . INGUINAL HERNIA REPAIR Right 12/29/2017   Dr Lucas Catchings  . EGD  11/09/2018   Dilated: No repeat per RTE  . CHOLECYSTECTOMY OPEN  03/03/2021   then stones were removed  . INGUINAL HERNIA REPAIR Left 1980s  . Kidney stones  1990s  . Lumbar laminectomy     Degenerative disc disease  . Posterolateral fusion L3-4, posterolateral fusion L4-5, lumbar decompression     central and lateral partial facetectomy and foraminotomy L2-3, lumbar decompression central and lateral facetectomy and foraminotomy L3-4, lumbar decompression and partial facetectomy 06/29/07.   SABRA SPINE SURGERY      Health Maintenance  Topic Date Due  . Parathyroid Hormone  Never done  . Adult Tetanus (Td And Tdap)  Never done  . Shingrix (1 of 2) Never done  . RSV Immunization Pregnant or 60+ (1 - 1-dose 75+ series) Never done  . Serum Phosphorus  05/12/2023  . COVID-19 Vaccine (1 - 2024-25 season) Never done  . TSH Level  03/19/2024  . Creatinine Level  12/20/2024  . Potassium Level  12/20/2024  . Lipid Panel  12/20/2024  . Serum Bicarbonate  12/20/2024  . Serum Calcium   12/20/2024  . Annual Physical/Well Child Check  12/29/2024  . Depression Screening  03/21/2025  . Medicare Subsequent AWV [id number]  04/04/2025  . Diabetes Screening  12/20/2026  . Pneumococcal Vaccine: 50+  Completed  . Influenza Vaccine  Completed  . Hib Vaccines  Aged Out  .  Hepatitis A Vaccines  Aged Out  . Meningococcal B Vaccine  Aged Out  . Meningococcal ACWY Vaccine  Aged Out  . HPV Vaccines  Aged Out    Vitals:   04/03/24 1501  BP: 126/76  Pulse: 86  SpO2: 95%  Weight: 90.3 kg (199 lb)  Height: 182.9 cm (6')   Body mass index is 26.99 kg/m. General. Alert oriented x3  Skin. No suspicious lesions or moles.   Eyes. Sclera and conjunctiva clear; pupils equal round and reactive to light and accommodation; extraocular movements intact Nose. Mucosa healthy without drainage or ulceration Oropharynx. No suspicious lesions Neck. No swelling, masses, stiffness, pain, limited movement, carotid pulses normal bilaterally, thyroid normal size, no masses palpated.  No bruits Lungs. Respirations unlabored; clear to auscultation bilaterally Back. No spinal deformity Cardiovascular. Heart regular rate and rhythm without murmurs, gallops, or rubs Abdomen. Soft; non tender; non distended; normoactive bowel sounds; no masses or organomegaly Lymph Nodes. No significant cervical, supraclavicular, axillary or inguinal lymphadenopathy noted Musculoskeletal. No deformities; no active joint inflammation Extremities. Normal, no edema Pulses. Dorsalis pedis palpable and symmetric bilaterally Neurologic. Alert and oriented; speech intact; face symmetrical; moves all extremities well  Assessment/Plan  1. Medicare wellness visit- Medications and allergies reviewed. Copy of preventative health provided.  Labs reviewed. 2. HTN- stable, same meds 3. HLD- diet/exercise/Zetia 4. CKD= push fluids, labs today 5. Dementia- worsening, PC involved, f/u with Neuro 6. RTC 3 mo, sooner if needed Leroy JONETTA Costa, MD, MD

## 2024-04-03 NOTE — Progress Notes (Signed)
 ENCOUNTER: Patient Class :No patient class for patient encounter Department: [facility]IC [address] [place] [address]  PATIENT: Patient Demographics      Name Patient ID SSN Gender Identity Birth Date   Leroy [name], Leroy [name] [id number] Male 12/26/2029 (89 yrs)          Address Phone Email       [address] [place] [address] [phone] [email or web address]) [email or web address]            [place] Race         [place] Caucasian/White             Reg Status PCP Date Last Verified Next Review Date     Verified Auston Reyes BIRCH [id number] 03/28/24 04/27/24           Marital Status Religion Language       Married Unknown-Patient Declined English              EMERGENCY CONTACT: Name Relationship Lgl Grd Work Administrator, sports  1. SABRINA MERCY Blades or Daughter    [phone]  2. SUMNER, KIRCHMAN Spouse   [phone]     GUARANTOR: There is no guarantor information entered for this encounter.  COVERAGE: Primary Visit Coverage      Payer Plan Group Number Group Name Payer Phone Plan Phone   No coverage found                Secondary Visit Coverage      Payer Plan Group Number Group Name Payer Phone Plan Phone   No coverage found                Primary Coverage      Payer Plan Group Number Group Name Payer Phone Plan Phone   [facility] MEDICARE ADVANTAGE PLAN [facility] [place] MEDICARE [place] [id number]   [phone]           Primary Subscriber      Subscriber ID Subscriber Name [facility] [place] Subscriber Address   [id number] [facility] [id number] [address]      [place], [place] [address]           Secondary Coverage      Payer Plan Group Number Group Name Payer Phone Plan Phone   No coverage found

## 2024-07-03 DEATH — deceased
